# Patient Record
Sex: Female | Born: 1935 | Race: White | Hispanic: No | Marital: Married | Smoking: Former smoker
Health system: Southern US, Community
[De-identification: ages and names within clinical notes are randomized; demographics above are authoritative.]

## PROBLEM LIST (undated history)

## (undated) DIAGNOSIS — R1310 Dysphagia, unspecified: Secondary | ICD-10-CM

## (undated) DIAGNOSIS — Z9889 Other specified postprocedural states: Secondary | ICD-10-CM

## (undated) DIAGNOSIS — I10 Essential (primary) hypertension: Secondary | ICD-10-CM

## (undated) DIAGNOSIS — F32A Depression, unspecified: Secondary | ICD-10-CM

## (undated) DIAGNOSIS — J45909 Unspecified asthma, uncomplicated: Secondary | ICD-10-CM

## (undated) DIAGNOSIS — C50919 Malignant neoplasm of unspecified site of unspecified female breast: Secondary | ICD-10-CM

## (undated) DIAGNOSIS — W19XXXA Unspecified fall, initial encounter: Secondary | ICD-10-CM

## (undated) DIAGNOSIS — E119 Type 2 diabetes mellitus without complications: Secondary | ICD-10-CM

## (undated) DIAGNOSIS — N189 Chronic kidney disease, unspecified: Secondary | ICD-10-CM

## (undated) DIAGNOSIS — Z923 Personal history of irradiation: Secondary | ICD-10-CM

## (undated) DIAGNOSIS — R296 Repeated falls: Secondary | ICD-10-CM

## (undated) DIAGNOSIS — C801 Malignant (primary) neoplasm, unspecified: Secondary | ICD-10-CM

## (undated) DIAGNOSIS — R112 Nausea with vomiting, unspecified: Secondary | ICD-10-CM

## (undated) DIAGNOSIS — F329 Major depressive disorder, single episode, unspecified: Secondary | ICD-10-CM

## (undated) HISTORY — PX: SPINE SURGERY: SHX786

## (undated) HISTORY — DX: Essential (primary) hypertension: I10

## (undated) HISTORY — DX: Type 2 diabetes mellitus without complications: E11.9

## (undated) HISTORY — PX: OTHER SURGICAL HISTORY: SHX169

## (undated) HISTORY — PX: SHOULDER SURGERY: SHX246

## (undated) HISTORY — DX: Depression, unspecified: F32.A

## (undated) HISTORY — DX: Unspecified fall, initial encounter: W19.XXXA

## (undated) HISTORY — DX: Major depressive disorder, single episode, unspecified: F32.9

## (undated) HISTORY — PX: ABDOMINAL HYSTERECTOMY: SHX81

## (undated) HISTORY — PX: HERNIA REPAIR: SHX51

## (undated) HISTORY — PX: BREAST CYST ASPIRATION: SHX578

## (undated) HISTORY — DX: Malignant (primary) neoplasm, unspecified: C80.1

---

## 1981-12-28 HISTORY — PX: HALLUX VALGUS CORRECTION: SUR315

## 2004-12-28 DIAGNOSIS — C801 Malignant (primary) neoplasm, unspecified: Secondary | ICD-10-CM

## 2004-12-28 HISTORY — PX: BREAST BIOPSY: SHX20

## 2004-12-28 HISTORY — DX: Malignant (primary) neoplasm, unspecified: C80.1

## 2017-01-06 ENCOUNTER — Other Ambulatory Visit: Payer: Self-pay | Admitting: Internal Medicine

## 2017-01-06 DIAGNOSIS — Z1239 Encounter for other screening for malignant neoplasm of breast: Secondary | ICD-10-CM

## 2017-01-14 ENCOUNTER — Ambulatory Visit: Payer: Medicare Other | Admitting: Physical Therapy

## 2017-01-18 ENCOUNTER — Encounter: Payer: Medicare Other | Admitting: Physical Therapy

## 2017-01-20 ENCOUNTER — Ambulatory Visit: Payer: Medicare Other | Attending: Internal Medicine

## 2017-01-20 VITALS — BP 132/63 | HR 79

## 2017-01-20 DIAGNOSIS — Z9181 History of falling: Secondary | ICD-10-CM | POA: Diagnosis present

## 2017-01-20 DIAGNOSIS — G8929 Other chronic pain: Secondary | ICD-10-CM | POA: Insufficient documentation

## 2017-01-20 DIAGNOSIS — M6281 Muscle weakness (generalized): Secondary | ICD-10-CM | POA: Diagnosis present

## 2017-01-20 DIAGNOSIS — M545 Low back pain: Secondary | ICD-10-CM | POA: Diagnosis present

## 2017-01-20 DIAGNOSIS — R2681 Unsteadiness on feet: Secondary | ICD-10-CM | POA: Diagnosis present

## 2017-01-20 NOTE — Therapy (Signed)
[facility] [address] [place], [address] Phone: [phone]   Fax:  [phone]  Physical Therapy Evaluation  Patient Details  Name: Kelsey Haynes MRN: [id number] Date of Birth: Jul 04, 1932 Referring Provider: Glendon Axe, MD  Encounter Date: 01/20/2017      PT End of Session - 01/20/17 0934    Visit Number 1   Number of Visits 13   Date for PT Re-Evaluation 03/04/17   Authorization Type 1   Authorization Time Period of 10 g code   PT Start Time 0938   PT Stop Time 1039   PT Time Calculation (min) 61 min   Equipment Utilized During Treatment Gait belt   Activity Tolerance Patient tolerated treatment well   Behavior During Therapy [facility] for tasks assessed/performed      Past Medical History:  Diagnosis Date  . Cancer [facility])    breast cancer 2006  . Depression   . Diabetes mellitus without complication ([place])   . Hypertension     Past Surgical History:  Procedure Laterality Date  . ABDOMINAL HYSTERECTOMY    . endoscopic carpal tunnel release    . HALLUX VALGUS CORRECTION Left 1983  . HERNIA REPAIR     x2  . left foot surgery Left   . SHOULDER SURGERY Bilateral   . SPINE SURGERY     back    Vitals:   01/20/17 0945 01/20/17 1017  BP: (!) 148/66 132/63  Pulse: 75 79         Subjective Assessment - 01/20/17 0945    Subjective back pain: 3/10 currently (pt sitting); 6/10 at most for the past 2 months   Pertinent History DDD, imbalance. Pt states recently moving here from [place] to the [employer]. Back pain recently started again (had back surgery 2006 which helped before). Feels like her age is catching up on her. Bending forward, cooking bothers her. Pt sometimes feel like she has to use the bathroom more often but has not become enough of a problem for her. Has control of bladder or bowel function. Woke up with a headache and a slight fever this past Monday, went to [place]  (was given a fall risk bracelet). Feels fine today. Pt states having problems adjusting to the humidity, got a humidifier, which helped her headaches.  Pt states having balance problems for the past 10 years. Had a fall 1 month ago due to tripping on something. Feels like if she is not careful, she falls. Has never been hurt in a fall before. Her neurologist in [place] recommended for her to use a SPC.  Pt states that she does not have osteoporosis. Had PT for balance before which helped. Feels like her balance is somewhat better due to prior PT.  Has not noticed light headedness or dizziness when she falls. Just feels like she makes a wrong move and has a hard time stopping herself.  Pt states that her L leg bothers her (dermatology thing). 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  The leg is not healing as well as it should. Has diabetes.  Does not bother her when she walks.   Pt also adds that fatigue might play a factor with her falls.   Patient Stated Goals Be able to go back and ride her bike, roller skate, go shopping (for longer hours (more than half a day)   Currently in Pain? Yes   Pain Score  3    Pain Location Back   Pain Orientation Lower   Pain Descriptors / Indicators Aching;Numbness;Pins and needles   Pain Type Chronic pain   Pain Onset More than a month ago   Pain Frequency Constant   Aggravating Factors  bending forward, standing and cooking, standing 30 minutes   Pain Relieving Factors resting on her R or L side (mostly on her L side), sitting, possibly walking, sitting up straight and not resting her back against the chair            [facility] PT Assessment - 01/20/17 0941      Assessment   Medical Diagnosis lumbosacral DDD, imbalance   Referring Provider Glendon Axe, MD   Onset Date/Surgical Date --  chronic condition   Hand Dominance Left   Next MD Visit 3 months from now   Prior Therapy had prior PT for balance which helped     Precautions    Precaution Comments fall risk     Restrictions   Other Position/Activity Restrictions no known restrictions     Balance Screen   Has the patient fallen in the past 6 months Yes   How many times? 1   Has the patient had a decrease in activity level because of a fear of falling?  No   Is the patient reluctant to leave their home because of a fear of falling?  No     Home Environment   Additional Comments pt lives in an apartment at the [facility] at [place], no steps to enter.      Prior Function   Vocation Retired  retired Ship broker PLOF: better able to cook, tolerate standing, bend over with less back pain.  Less fall risk.      Observation/Other Assessments   Observations light headed after sitting down from standing. Eases with rest.  TUG without SPC 13 seconds, 11 seconds, 10 seconds (11.3 seconds average);  five times sit to stand 12 seconds , use of bilateral UE    Modified Oswertry 34%   Lower Extremity Functional Scale  54/80     Posture/Postural Control   Posture Comments decreased weight bearing L LE, slight L lateral shift, bilaterally protracted shoulders, thoracic kyphosis , L iliac crest higher     AROM   Lumbar Flexion WFL with bilateral LE pulling  Pt head not clear when returning to standing. Eases at rest   Lumbar Extension limited   Lumbar - Right Side Bend limited with quick discomfort   Lumbar - Left Side Bend limited   Lumbar - Right Rotation full   Lumbar - Left Rotation limited. Pt does not feel solid support from both legs     Strength   Right Hip Flexion 4+/5   Right Hip ABduction 4-/5   Left Hip Flexion 4/5   Left Hip ABduction 4-/5   Right Knee Flexion 4/5   Right Knee Extension 4+/5   Left Knee Flexion 4+/5   Left Knee Extension 4+/5     Palpation   Palpation comment TTP L lateral hip at greater trochanter and lateral thigh     Ambulation/Gait   Gait Comments antalgic, decreased stance R LE with R lateral lean, slight  unsteadiness                         PT Education - 01/20/17 1320    Education provided Yes   Education Details plan of care  Person(s) Educated Patient   Methods Explanation   Comprehension Verbalized understanding             PT Long Term Goals - 01/20/17 1149      PT LONG TERM GOAL #1   Title Patient will have a decrease in back pain to 3/10 or less at most to promote ability to perform standing tasks such as cooking.    Baseline 6/10 at most for the past 2 months (01/20/2017)   Time 6   Period Weeks   Status New     PT LONG TERM GOAL #2   Title Patient will improve her Modified Oswestry Low Back Pain Disability Questionnaire by at least 12% as a demonstration of improved function.    Baseline 34% (01/20/2017)   Time 6   Period Weeks   Status New     PT LONG TERM GOAL #3   Title Patient will improve her LEFS score by at least 9 points as a demonnstration of improved function.    Baseline 54/80 (01/20/2017)   Time 6   Period Weeks   Status New     PT LONG TERM GOAL #4   Title Pt will improve bilateral LE strength by at least 1/2 MMT grade to promote more steadiness with gait, decrease fall risk, and improve ability to perform standing tasks.    Time 6   Period Weeks   Status New     PT LONG TERM GOAL #5   Title Pt will improve her 5 times sit to stand time without using hands to 11 seconds or less to promote balance.   Baseline 12 seconds with use of hands (01/20/2017)   Time 6   Period Weeks   Status New               Plan - 01/20/17 1130    Clinical Impression Statement Patient is an 83 year old female who came to physical therapy secondary to low back pain and decreased balance. She also presents with altered gait pattern (with unsteadiness) and posture, bilateral LE weakness, reproduction of symptoms with lumbar flexion R side bending and L rotation with L lumbar rotation causing decreased LE support in standing per pt reports, and  difficulty performing functional tasks such as cooking, as well as movements such as bending forward and tolerating positions such as standing. Patient will benefit from skilled physical therapy services to address the aforementioned deficits.    Rehab Potential Fair   Clinical Impairments Affecting Rehab Potential chronicity of condition   PT Frequency 2x / week   PT Duration 6 weeks   PT Treatment/Interventions Therapeutic exercise;Therapeutic activities;Manual techniques;Patient/family education;Neuromuscular re-education;Ultrasound;Gait training;Iontophoresis 4mg /ml Dexamethasone;Electrical Stimulation;Aquatic Therapy   PT Next Visit Plan LE strengthenig, balance, thoracic extension, scapular strengthening, core strengthening   Consulted and Agree with Plan of Care Patient      Patient will benefit from skilled therapeutic intervention in order to improve the following deficits and impairments:  Pain, Postural dysfunction, Improper body mechanics, Difficulty walking, Decreased strength, Decreased balance  Visit Diagnosis: Chronic bilateral low back pain, with sciatica presence unspecified - Plan: PT plan of care cert/re-cert  History of falling - Plan: PT plan of care cert/re-cert  Muscle weakness (generalized) - Plan: PT plan of care cert/re-cert  Unsteadiness on feet - Plan: PT plan of care cert/re-cert      G-Codes - [id number] 04-21-05    Functional Assessment Tool Used Modified Oswestry Low Back Pain Disability Questionnaire, LEFS, clinical presentation, patient  interview   Functional Limitation Mobility: Walking and moving around   Mobility: Walking and Moving Around Current Status [phone]) At least 20 percent but less than 40 percent impaired, limited or restricted   Mobility: Walking and Moving Around Goal Status [phone]) At least 1 percent but less than 20 percent impaired, limited or restricted       Problem List There are no active problems to display for this  patient.   Joneen Boers PT, DPT  01/20/2017, 1:33 PM  [facility] [address] [place], [address] Phone: [phone]   Fax:  [phone]  Name: Kelsey Haynes MRN: [id number] Date of Birth: 1932-04-03

## 2017-01-25 ENCOUNTER — Ambulatory Visit: Payer: Medicare Other

## 2017-01-25 DIAGNOSIS — G8929 Other chronic pain: Secondary | ICD-10-CM

## 2017-01-25 DIAGNOSIS — M6281 Muscle weakness (generalized): Secondary | ICD-10-CM

## 2017-01-25 DIAGNOSIS — Z9181 History of falling: Secondary | ICD-10-CM

## 2017-01-25 DIAGNOSIS — M545 Low back pain: Secondary | ICD-10-CM | POA: Diagnosis not present

## 2017-01-25 DIAGNOSIS — R2681 Unsteadiness on feet: Secondary | ICD-10-CM

## 2017-01-25 NOTE — Therapy (Signed)
[facility] [address], [place], [address] Phone: [phone]   Fax:  [phone]  Physical Therapy Treatment  Patient Details  Name: Kelsey Haynes MRN: [id number] Date of Birth: May 12, 1932 Referring Provider: Glendon Axe, MD  Encounter Date: 01/25/2017      PT End of Session - 01/25/17 1449    Visit Number 2   Number of Visits 13   Date for PT Re-Evaluation 03/04/17   Authorization Type 2   Authorization Time Period of 10 g code   PT Start Time 1450   PT Stop Time 1532   PT Time Calculation (min) 42 min   Equipment Utilized During Treatment Gait belt   Activity Tolerance Patient tolerated treatment well   Behavior During Therapy [facility] for tasks assessed/performed      Past Medical History:  Diagnosis Date  . Cancer [facility])    breast cancer 2006  . Depression   . Diabetes mellitus without complication ([place])   . Hypertension     Past Surgical History:  Procedure Laterality Date  . ABDOMINAL HYSTERECTOMY    . endoscopic carpal tunnel release    . HALLUX VALGUS CORRECTION Left 1983  . HERNIA REPAIR     x2  . left foot surgery Left   . SHOULDER SURGERY Bilateral   . SPINE SURGERY     back    There were no vitals filed for this visit.      Subjective Assessment - 01/25/17 1451    Subjective Had surgery in her L leg and has an open wound about the size of a quarter. The doctor wants it to heal naturally. Went to [facility] for that last Friday.  Sat prior to back bothering her. 3/10 back pain currently.  Walking 2-3 hours in the apartement earlier today bothers her back.  The irritation is in the mid back area and not down low.   One of the back surgeries was a fusion around L4/L5 area.    Pertinent History DDD, imbalance. Pt states recently moving here from [place] to the [employer]. Back pain recently started again (had back surgery 2006 which helped before). Feels like her age is catching  up on her. Bending forward, cooking bothers her. Pt sometimes feel like she has to use the bathroom more often but has not become enough of a problem for her. Has control of bladder or bowel function. Woke up with a headache and a slight fever this past Monday, went to [place] (was given a fall risk bracelet). Feels fine today. Pt states having problems adjusting to the humidity, got a humidifier, which helped her headaches.  Pt states having balance problems for the past 10 years. Had a fall 1 month ago due to tripping on something. Feels like if she is not careful, she falls. Has never been hurt in a fall before. Her neurologist in [place] recommended for her to use a SPC.  Pt states that she does not have osteoporosis. Had PT for balance before which helped. Feels like her balance is somewhat better due to prior PT.  Has not noticed light headedness or dizziness when she falls. Just feels like she makes a wrong move and has a hard time stopping herself.  Pt states that her L leg bothers her (dermatology thing). 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  The leg is not healing as well as it should. Has diabetes.  Does  not bother her when she walks.   Pt also adds that fatigue might play a factor with her falls.   Patient Stated Goals Be able to go back and ride her bike, roller skate, go shopping (for longer hours (more than half a day)   Currently in Pain? Yes   Pain Score 3    Pain Onset More than a month ago                                 PT Education - 01/25/17 1910    Education provided Yes   Education Details ther-ex, HEP   Person(s) Educated Patient   Methods Explanation;Demonstration;Tactile cues;Verbal cues;Handout   Comprehension Verbalized understanding;Returned demonstration       Objectives  There-ex  Directed patient with supine posterior pelvic tilts 10x3 Supine bilateral scapular retraction 10x3  Supine naval -ins 10x3 with  5 seconnd holds   Then with pelvic floor contraction 10x2 with 5 seocnd holds   S/L clam shells 10x, then 7x for R side, 10x2 for L side (L lateral LE paresthesia after 2nd set of 10 which eases in supine hooklying position and rest)   Limited bilateral hip 90/90 IR    L hip IR stretch by PT in hooklying with L LE crossed over R  Improved L hip IR ROM in 90/90 position.   Improved exercise technique, movement at target joints, use of target muscles after mod verbal, visual, tactile cues.    Worked on lumbopelvic stability and hip strengthening to promote trunk control with gait. Pt tolerated session well without aggravation of symptoms.        PT Long Term Goals - 01/20/17 1149      PT LONG TERM GOAL #1   Title Patient will have a decrease in back pain to 3/10 or less at most to promote ability to perform standing tasks such as cooking.    Baseline 6/10 at most for the past 2 months (01/20/2017)   Time 6   Period Weeks   Status New     PT LONG TERM GOAL #2   Title Patient will improve her Modified Oswestry Low Back Pain Disability Questionnaire by at least 12% as a demonstration of improved function.    Baseline 34% (01/20/2017)   Time 6   Period Weeks   Status New     PT LONG TERM GOAL #3   Title Patient will improve her LEFS score by at least 9 points as a demonnstration of improved function.    Baseline 54/80 (01/20/2017)   Time 6   Period Weeks   Status New     PT LONG TERM GOAL #4   Title Pt will improve bilateral LE strength by at least 1/2 MMT grade to promote more steadiness with gait, decrease fall risk, and improve ability to perform standing tasks.    Time 6   Period Weeks   Status New     PT LONG TERM GOAL #5   Title Pt will improve her 5 times sit to stand time without using hands to 11 seconds or less to promote balance.   Baseline 12 seconds with use of hands (01/20/2017)   Time 6   Period Weeks   Status New               Plan - 01/25/17 1910     Clinical Impression Statement Worked on lumbopelvic stability and hip strengthening  to promote trunk control with gait. Pt tolerated session well without aggravation of symptoms.    Rehab Potential Fair   Clinical Impairments Affecting Rehab Potential chronicity of condition   PT Frequency 2x / week   PT Duration 6 weeks   PT Treatment/Interventions Therapeutic exercise;Therapeutic activities;Manual techniques;Patient/family education;Neuromuscular re-education;Ultrasound;Gait training;Iontophoresis 4mg /ml Dexamethasone;Electrical Stimulation;Aquatic Therapy   PT Next Visit Plan LE strengthenig, balance, thoracic extension, scapular strengthening, core strengthening   Consulted and Agree with Plan of Care Patient      Patient will benefit from skilled therapeutic intervention in order to improve the following deficits and impairments:  Pain, Postural dysfunction, Improper body mechanics, Difficulty walking, Decreased strength, Decreased balance  Visit Diagnosis: Chronic bilateral low back pain, with sciatica presence unspecified  History of falling  Muscle weakness (generalized)  Unsteadiness on feet     Problem List There are no active problems to display for this patient.   Joneen Boers PT, DPT   01/25/2017, 7:14 PM  [facility] [address] [place], [address] Phone: [phone]   Fax:  [phone]  Name: Kelsey Haynes MRN: [id number] Date of Birth: 1932-04-02

## 2017-01-25 NOTE — Patient Instructions (Signed)
   On your back   Gently pull your belly button in. Hold for 5 seconds.   Repeat 10 times   Do 3 sets (at least) daily.

## 2017-01-27 ENCOUNTER — Ambulatory Visit: Payer: Medicare Other

## 2017-01-27 DIAGNOSIS — M6281 Muscle weakness (generalized): Secondary | ICD-10-CM

## 2017-01-27 DIAGNOSIS — R2681 Unsteadiness on feet: Secondary | ICD-10-CM

## 2017-01-27 DIAGNOSIS — M545 Low back pain: Secondary | ICD-10-CM | POA: Diagnosis not present

## 2017-01-27 DIAGNOSIS — Z9181 History of falling: Secondary | ICD-10-CM

## 2017-01-27 DIAGNOSIS — G8929 Other chronic pain: Secondary | ICD-10-CM

## 2017-01-27 NOTE — Therapy (Signed)
[facility] [address], [place], [address] Phone: [phone]   Fax:  [phone]  Physical Therapy Treatment  Patient Details  Name: Kelsey Haynes MRN: [id number] Date of Birth: Jul 10, 1932 Referring Provider: Glendon Axe, MD  Encounter Date: 01/27/2017      PT End of Session - 01/27/17 [id number]    Visit Number 3   Number of Visits 13   Date for PT Re-Evaluation 03/04/17   Authorization Type 3   Authorization Time Period of 10 g code   PT Start Time [id number]   PT Stop Time 1529   PT Time Calculation (min) 50 min   Equipment Utilized During Treatment Gait belt   Activity Tolerance Patient tolerated treatment well   Behavior During Therapy [facility] for tasks assessed/performed      Past Medical History:  Diagnosis Date  . Cancer [facility])    breast cancer 2006  . Depression   . Diabetes mellitus without complication ([place])   . Hypertension     Past Surgical History:  Procedure Laterality Date  . ABDOMINAL HYSTERECTOMY    . endoscopic carpal tunnel release    . HALLUX VALGUS CORRECTION Left 1983  . HERNIA REPAIR     x2  . left foot surgery Left   . SHOULDER SURGERY Bilateral   . SPINE SURGERY     back    There were no vitals filed for this visit.      Subjective Assessment - 01/27/17 1440    Subjective Back is ok. Not particularly having pain. Has not done much up and down sitting etc. R knee bothers her but her doctor does not want to do surgery because her diabetes affects her healing process.    Pertinent History DDD, imbalance. Pt states recently moving here from [place] to the [employer]. Back pain recently started again (had back surgery 2006 which helped before). Feels like her age is catching up on her. Bending forward, cooking bothers her. Pt sometimes feel like she has to use the bathroom more often but has not become enough of a problem for her. Has control of bladder or bowel  function. Woke up with a headache and a slight fever this past Monday, went to [place] (was given a fall risk bracelet). Feels fine today. Pt states having problems adjusting to the humidity, got a humidifier, which helped her headaches.  Pt states having balance problems for the past 10 years. Had a fall 1 month ago due to tripping on something. Feels like if she is not careful, she falls. Has never been hurt in a fall before. Her neurologist in [place] recommended for her to use a SPC.  Pt states that she does not have osteoporosis. Had PT for balance before which helped. Feels like her balance is somewhat better due to prior PT.  Has not noticed light headedness or dizziness when she falls. Just feels like she makes a wrong move and has a hard time stopping herself.  Pt states that her L leg bothers her (dermatology thing). 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  The leg is not healing as well as it should. Has diabetes.  Does not bother her when she walks.   Pt also adds that fatigue might play a factor with her falls.   Patient Stated Goals Be able to go back and ride her bike, roller skate, go shopping (for longer hours (more than half  a day)   Currently in Pain? No/denies  no back pain   Pain Onset More than a month ago            [facility] PT Assessment - 01/27/17 1443      Berg Balance Test   Sit to Stand Able to stand without using hands and stabilize independently   Standing Unsupported Able to stand safely 2 minutes   Sitting with Back Unsupported but Feet Supported on Floor or Stool Able to sit safely and securely 2 minutes   Stand to Sit Sits safely with minimal use of hands   Transfers Able to transfer safely, minor use of hands   Standing Unsupported with Eyes Closed Able to stand 10 seconds safely   Standing Ubsupported with Feet Together Able to place feet together independently and stand for 1 minute with supervision   From Standing, Reach Forward with  Outstretched Arm Can reach forward >5 cm safely (2")   From Standing Position, Pick up Object from [place] to pick up shoe safely and easily   From Standing Position, Turn to Look Behind Over each Shoulder Looks behind from both sides and weight shifts well   Turn 360 Degrees Needs close supervision or verbal cueing  secondary to backwards lean   Standing Unsupported, Alternately Place Feet on Step/Stool Able to complete 4 steps without aid or supervision   Standing Unsupported, One Foot in [place] to take small step independently and hold 30 seconds   Standing on One Leg Unable to try or needs assist to prevent fall  for L LE secondary to wound; 10 sec for R LE   Total Score 42                             PT Education - 01/27/17 1441    Education provided Yes   Education Details ther-ex   [employer]) Educated Patient   Methods Explanation;Demonstration;Tactile cues;Verbal cues   Comprehension Verbalized understanding;Returned demonstration        Objectives  There-ex  Directed patient with sit <> stand throughout session Stand pivot transfer chair <> low mat table Static standing shoulder width apart, then with eyes closed, then with eyes open feet together, tandem stance with feet shoulder width apart Picking up an object (computer mouse) from the floor,  Turning 360 degrees to the R and to the L. Able to turn to the R in 4 seconds, L in greater than 4 seconds. Unsteady at the stopping point due to backwards lean.  Looking behind to the R and to the L,  Standing forward reach,   Standing alternate toe taps 4 x each LE onto stool without UE assist, but with CGA  Side stepping 32 ft to the L and R CGA for glute med strengthening.  standing hip abduction with bilateral UE assist 10x2 each LE for glute med strengthening to promote pelvic control and decrease lateral lean during gait.   Standing bilateral shoulder extension resisting yellow band 10x2  with abdominal muscle use to promote trunk strengthening.   Standing forward weight shifting without UE assist but CGA 10x5 seconds secondary to tendency for backwards lean    Improved exercise technique, movement at target joints, use of target muscles after mod verbal, visual, tactile cues.    Able to perform standing L trunk rotation without feeling of lack of support from LE today. Able to perform SLS on R LE for 10 seconds  but unable to perform for L LE due to L leg discomfort from skin biopsy/surgery. Pt also demonstrates R knee joint line pain with R side stepping, eases with rest and with L side stepping. R posterior knee pain when standing with equal weight distribution. Decreased with L weight shifting. Worked on standing forward weight shifting as well secondary to tendency for backwards lean. Pt observed to have unsteadiness at times due to backwards lean.          PT Long Term Goals - 01/20/17 1149      PT LONG TERM GOAL #1   Title Patient will have a decrease in back pain to 3/10 or less at most to promote ability to perform standing tasks such as cooking.    Baseline 6/10 at most for the past 2 months (01/20/2017)   Time 6   Period Weeks   Status New     PT LONG TERM GOAL #2   Title Patient will improve her Modified Oswestry Low Back Pain Disability Questionnaire by at least 12% as a demonstration of improved function.    Baseline 34% (01/20/2017)   Time 6   Period Weeks   Status New     PT LONG TERM GOAL #3   Title Patient will improve her LEFS score by at least 9 points as a demonnstration of improved function.    Baseline 54/80 (01/20/2017)   Time 6   Period Weeks   Status New     PT LONG TERM GOAL #4   Title Pt will improve bilateral LE strength by at least 1/2 MMT grade to promote more steadiness with gait, decrease fall risk, and improve ability to perform standing tasks.    Time 6   Period Weeks   Status New     PT LONG TERM GOAL #5   Title Pt will  improve her 5 times sit to stand time without using hands to 11 seconds or less to promote balance.   Baseline 12 seconds with use of hands (01/20/2017)   Time 6   Period Weeks   Status New               Plan - 01/27/17 1437    Clinical Impression Statement Able to perform standing L trunk rotation without feeling of lack of support from LE today. Able to perform SLS on R LE for 10 seconds but unable to perform for L LE due to L leg discomfort from skin biopsy/surgery. Pt also demonstrates R knee joint line pain with R side stepping, eases with rest and with L side stepping. R posterior knee pain when standing with equal weight distribution. Decreased with L weight shifting. Worked on standing forward weight shifting as well secondary to tendency for backwards lean. Pt observed to have unsteadiness at times due to backwards lean.    Rehab Potential Fair   Clinical Impairments Affecting Rehab Potential chronicity of condition   PT Frequency 2x / week   PT Duration 6 weeks   PT Treatment/Interventions Therapeutic exercise;Therapeutic activities;Manual techniques;Patient/family education;Neuromuscular re-education;Ultrasound;Gait training;Iontophoresis 4mg /ml Dexamethasone;Electrical Stimulation;Aquatic Therapy   PT Next Visit Plan LE strengthenig, balance, thoracic extension, scapular strengthening, core strengthening   Consulted and Agree with Plan of Care Patient      Patient will benefit from skilled therapeutic intervention in order to improve the following deficits and impairments:  Pain, Postural dysfunction, Improper body mechanics, Difficulty walking, Decreased strength, Decreased balance  Visit Diagnosis: Chronic bilateral low back pain, with sciatica presence unspecified  History of falling  Muscle weakness (generalized)  Unsteadiness on feet     Problem List There are no active problems to display for this patient.   Joneen Boers PT, DPT   01/27/2017, 3:42  PM  [place] [facility] [address] [place], [address] Phone: [phone]   Fax:  [phone]  Name: Allyssia Sisto MRN: [id number] Date of Birth: 1932/02/16

## 2017-02-01 ENCOUNTER — Encounter: Payer: Medicare Other | Admitting: Physical Therapy

## 2017-02-02 ENCOUNTER — Ambulatory Visit: Payer: Medicare Other | Attending: Internal Medicine

## 2017-02-02 DIAGNOSIS — Z9181 History of falling: Secondary | ICD-10-CM | POA: Diagnosis present

## 2017-02-02 DIAGNOSIS — G8929 Other chronic pain: Secondary | ICD-10-CM

## 2017-02-02 DIAGNOSIS — M545 Low back pain: Secondary | ICD-10-CM | POA: Insufficient documentation

## 2017-02-02 DIAGNOSIS — R2681 Unsteadiness on feet: Secondary | ICD-10-CM | POA: Diagnosis present

## 2017-02-02 DIAGNOSIS — M6281 Muscle weakness (generalized): Secondary | ICD-10-CM | POA: Diagnosis present

## 2017-02-02 NOTE — Therapy (Signed)
[place] [facility] [address], [place], [address] Phone: [phone]   Fax:  [phone]  Physical Therapy Treatment  Patient Details  Name: Kelsey Haynes MRN: [id number] Date of Birth: 08/17/1932 Referring Provider: Glendon Axe, MD  Encounter Date: 02/02/2017      PT End of Session - 02/02/17 1430    Visit Number 4   Number of Visits 13   Date for PT Re-Evaluation 03/04/17   Authorization Type 4   Authorization Time Period of 10 g code   PT Start Time 1430   PT Stop Time 1516   PT Time Calculation (min) 46 min   Equipment Utilized During Treatment Gait belt   Activity Tolerance Patient tolerated treatment well   Behavior During Therapy [facility] for tasks assessed/performed      Past Medical History:  Diagnosis Date  . Cancer [facility])    breast cancer 2006  . Depression   . Diabetes mellitus without complication ([place])   . Hypertension     Past Surgical History:  Procedure Laterality Date  . ABDOMINAL HYSTERECTOMY    . endoscopic carpal tunnel release    . HALLUX VALGUS CORRECTION Left 1983  . HERNIA REPAIR     x2  . left foot surgery Left   . SHOULDER SURGERY Bilateral   . SPINE SURGERY     back    There were no vitals filed for this visit.      Subjective Assessment - 02/02/17 1431    Subjective Pt states not feeling well. Feels like she is tired of fighting her L leg wound which is about as big as a quarter, and a quarter of an inch deep. Supposed to do a little walking but nothing strenuous and supposed to put her feet up. Feels like making some progress this morning. Last Friday, her wound looked like a mess.  Has not been doing much so back has not really been bothering her. Has been doing exercises.  No fever.  Pt states that she gets dizzy getting in and out of bed. Has had dizziness before.    Pertinent History DDD, imbalance. Pt states recently moving here from [place] to the [employer]. Back pain recently started again (had back surgery 2006 which helped before). Feels like her age is catching up on her. Bending forward, cooking bothers her. Pt sometimes feel like she has to use the bathroom more often but has not become enough of a problem for her. Has control of bladder or bowel function. Woke up with a headache and a slight fever this past Monday, went to [place] (was given a fall risk bracelet). Feels fine today. Pt states having problems adjusting to the humidity, got a humidifier, which helped her headaches.  Pt states having balance problems for the past 10 years. Had a fall 1 month ago due to tripping on something. Feels like if she is not careful, she falls. Has never been hurt in a fall before. Her neurologist in [place] recommended for her to use a SPC.  Pt states that she does not have osteoporosis. Had PT for balance before which helped. Feels like her balance is somewhat better due to prior PT.  Has not noticed light headedness or dizziness when she falls. Just feels like she makes a wrong move and has a hard time stopping herself.  Pt states that her L leg bothers her (dermatology thing). Had a biopsy for her L leg which  revealed a squamous cell carcinoma.  The leg is not healing as well as it should. Has diabetes.  Does not bother her when she walks.   Pt also adds that fatigue might play a factor with her falls.   Patient Stated Goals Be able to go back and ride her bike, roller skate, go shopping (for longer hours (more than half a day)   Currently in Pain? Other (Comment)  pt states back not really bothering her   Pain Onset More than a month ago                                 PT Education - 02/02/17 1448    Education provided Yes   Education Details ther-ex   [employer]) Educated Patient   Methods Explanation;Demonstration;Tactile cues;Verbal cues;Handout   Comprehension Returned demonstration;Verbalized understanding         Objectives  There-ex  Seated bilateral scapular retraction 10x3 with 5 second holds   Standing forward weight shifting without UE assist but CGA 10x5 seconds for 2 sets  secondary to tendency for backwards lean  Sitting with proper posture: gentle manual perturbation from PT through PVC rod 5x10 seconds for 2 sets to promote trunk strengthening   Standing hip abduction with bilateral UE assist 10x2 R LE; 2x5, then 10x L LE with R LE on Air Ex pad each LE (decreased R knee and ankle pressure when R LE on Air Ex pad)  Standing alternating toe taps onto treadmill platform without UE assist 2x5 Slight light headedness which decreased with rest  Blood pressure obtained, L hand sitting, mechanically taken: 129/58, HR 91   Blood pressure L arm standing after 2 min, mechanically taken 103/55, HR 89   Pt looking at PT finger R and L directions with static head  Nystagmus observed bilateral eyes R > L for L and R directions.    Improved exercise technique, movement at target joints, use of target muscles after mod verbal, visual, tactile cues.     Worked on thoracic extension, trunk strengthening and glute med strengthening for back pain, worked on standing forward weight shifting and glute med strengthening for balance. Pt also demonstrates slight lightheadedness with performing a standing balance activity (standing alternating toe taps). Blood pressure lower in standing compared to sitting. Pt also demonstrates slight nystagmus R > L eye when pt trying to follow PT finger L and R horizontal directions. LOB x 1 when walking today when on R LE stance phase in which R knee pain might play a factor.          PT Long Term Goals - 01/20/17 1149      PT LONG TERM GOAL #1   Title Patient will have a decrease in back pain to 3/10 or less at most to promote ability to perform standing tasks such as cooking.    Baseline 6/10 at most for the past 2 months (01/20/2017)   Time 6   Period  Weeks   Status New     PT LONG TERM GOAL #2   Title Patient will improve her Modified Oswestry Low Back Pain Disability Questionnaire by at least 12% as a demonstration of improved function.    Baseline 34% (01/20/2017)   Time 6   Period Weeks   Status New     PT LONG TERM GOAL #3   Title Patient will improve her LEFS score by at least 9 points  as a demonnstration of improved function.    Baseline 54/80 (01/20/2017)   Time 6   Period Weeks   Status New     PT LONG TERM GOAL #4   Title Pt will improve bilateral LE strength by at least 1/2 MMT grade to promote more steadiness with gait, decrease fall risk, and improve ability to perform standing tasks.    Time 6   Period Weeks   Status New     PT LONG TERM GOAL #5   Title Pt will improve her 5 times sit to stand time without using hands to 11 seconds or less to promote balance.   Baseline 12 seconds with use of hands (01/20/2017)   Time 6   Period Weeks   Status New               Plan - 02/02/17 1429    Clinical Impression Statement Worked on thoracic extension, trunk strengthening and glute med strengthening for back pain, worked on standing forward weight shifting and glute med strengthening for balance. Pt also demonstrates slight lightheadedness with performing a standing balance activity (standing alternating toe taps). Blood pressure lower in standing compared to sitting. Pt also demonstrates slight nystagmus R > L eye when pt trying to follow PT finger L and R horizontal directions. LOB x 1 when walking today when on R LE stance phase in which R knee pain might play a factor.    Rehab Potential Fair   Clinical Impairments Affecting Rehab Potential chronicity of condition   PT Frequency 2x / week   PT Duration 6 weeks   PT Treatment/Interventions Therapeutic exercise;Therapeutic activities;Manual techniques;Patient/family education;Neuromuscular re-education;Ultrasound;Gait training;Iontophoresis 4mg /ml  Dexamethasone;Electrical Stimulation;Aquatic Therapy   PT Next Visit Plan LE strengthenig, balance, thoracic extension, scapular strengthening, core strengthening   Consulted and Agree with Plan of Care Patient      Patient will benefit from skilled therapeutic intervention in order to improve the following deficits and impairments:  Pain, Postural dysfunction, Improper body mechanics, Difficulty walking, Decreased strength, Decreased balance  Visit Diagnosis: Chronic bilateral low back pain, with sciatica presence unspecified  History of falling  Muscle weakness (generalized)  Unsteadiness on feet     Problem List There are no active problems to display for this patient.   Joneen Boers PT, DPT   02/02/2017, 7:18 PM  [place] [facility] [address] [place], [address] Phone: [phone]   Fax:  [phone]  Name: Brynley Badgley MRN: [id number] Date of Birth: October 19, 1932

## 2017-02-02 NOTE — Patient Instructions (Signed)
    Scapular Retraction (Sitting)   With arms at sides, pinch shoulder blades together. Hold for 5 seconds. Repeat __10__ times per set. Do __3__ sets per session daily.     Copyright  VHI. All rights reserved.

## 2017-02-03 ENCOUNTER — Encounter: Payer: Medicare Other | Admitting: Physical Therapy

## 2017-02-04 ENCOUNTER — Ambulatory Visit: Payer: Medicare Other

## 2017-02-04 DIAGNOSIS — G8929 Other chronic pain: Secondary | ICD-10-CM

## 2017-02-04 DIAGNOSIS — M6281 Muscle weakness (generalized): Secondary | ICD-10-CM

## 2017-02-04 DIAGNOSIS — M545 Low back pain: Principal | ICD-10-CM

## 2017-02-04 DIAGNOSIS — Z9181 History of falling: Secondary | ICD-10-CM

## 2017-02-04 DIAGNOSIS — R2681 Unsteadiness on feet: Secondary | ICD-10-CM

## 2017-02-04 NOTE — Therapy (Signed)
[facility] [address] [place], [address] Phone: [phone]   Fax:  [phone]  Physical Therapy Treatment  Patient Details  Name: Kelsey Haynes MRN: [id number] Date of Birth: 1932-10-12 Referring Provider: Glendon Axe, MD  Encounter Date: 02/04/2017      PT End of Session - 02/04/17 0950    Visit Number 5   Number of Visits 13   Date for PT Re-Evaluation 03/04/17   Authorization Type 5   Authorization Time Period of 10 g code   PT Start Time 0950   PT Stop Time 1030   PT Time Calculation (min) 40 min   Equipment Utilized During Treatment Gait belt   Activity Tolerance Patient tolerated treatment well   Behavior During Therapy [facility] for tasks assessed/performed      Past Medical History:  Diagnosis Date  . Cancer [facility])    breast cancer 2006  . Depression   . Diabetes mellitus without complication ([place])   . Hypertension     Past Surgical History:  Procedure Laterality Date  . ABDOMINAL HYSTERECTOMY    . endoscopic carpal tunnel release    . HALLUX VALGUS CORRECTION Left 1983  . HERNIA REPAIR     x2  . left foot surgery Left   . SHOULDER SURGERY Bilateral   . SPINE SURGERY     back    There were no vitals filed for this visit.      Subjective Assessment - 02/04/17 0952    Subjective The wound in her L leg has been pretty painful for the last couple of days. Yesterday was fine because she propped her feet up. Got the swelling off.  L leg is ok when doing her standing balance exercises.  Back has been doing ok.  Takes breaks when she feels like her back has had enough activity. Pt also adds that her doctor told her that the healing in her L leg might take a year.  Pt also states being "Foggy" when on her feet. Not "foggy" when sitting. It's nothing new.  Pt also states being stressed due to having a mamogram tomorrow (has hx of breast CA), husband is sick, and has a meeting to go to after  today's session.    Pertinent History DDD, imbalance. Pt states recently moving here from [place] to the [employer]. Back pain recently started again (had back surgery 2006 which helped before). Feels like her age is catching up on her. Bending forward, cooking bothers her. Pt sometimes feel like she has to use the bathroom more often but has not become enough of a problem for her. Has control of bladder or bowel function. Woke up with a headache and a slight fever this past Monday, went to [place] (was given a fall risk bracelet). Feels fine today. Pt states having problems adjusting to the humidity, got a humidifier, which helped her headaches.  Pt states having balance problems for the past 10 years. Had a fall 1 month ago due to tripping on something. Feels like if she is not careful, she falls. Has never been hurt in a fall before. Her neurologist in [place] recommended for her to use a SPC.  Pt states that she does not have osteoporosis. Had PT for balance before which helped. Feels like her balance is somewhat better due to prior PT.  Has not noticed light headedness or dizziness when she falls. Just feels like she makes a wrong move and  has a hard time stopping herself.  Pt states that her L leg bothers her (dermatology thing). 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  The leg is not healing as well as it should. Has diabetes.  Does not bother her when she walks.   Pt also adds that fatigue might play a factor with her falls.   Patient Stated Goals Be able to go back and ride her bike, roller skate, go shopping (for longer hours (more than half a day)   Currently in Pain? Other (Comment)  No complain of back pain today   Pain Onset More than a month ago                                 PT Education - 02/04/17 1008    Education provided Yes   Education Details ther-ex   [employer]) Educated Patient   Methods  Explanation;Demonstration;Tactile cues;Verbal cues   Comprehension Returned demonstration;Verbalized understanding        Objectives  Pt observed to ambulate to and from gym with walking stick.   There-ex   Directed patient with standing backwards walking 5 ft with one UE assist to light touch assist 9x for balance  Seated bilateral scapular retraction 10x2 with 5 second holds to promote thoracic extension  BP L arm sitting, mechanically taken: 120/56, HR 74. Measured secondary to pt stating feeling "foggy" earlier.   Standing hip abduction with bilateral UE assist 10x2. R LE on Air Ex pad. L leg fatigue afterwards.   Seated kneee extension 5x2 with 10 seconds each LE to promote quad strengthening in non weight bearing (for knees)  Standing bilateral scapular rows resisting yellow band 10x5 seconds  Then on Air Ex pad 10x2 for balance and decrease pressure to R knee  Sitting with proper posture: gentle manual perturbation from PT through PVC rod 5x10 seconds, then 6x10 seconds to promote trunk strengthening    Improved exercise technique, movement at target joints, use of target muscles after mod verbal, visual, tactile cues.     Initial feeling of "foggy" after performing backwards walk exercise. No symptoms with sitting and rest. No symptoms rest of sessoin. No complain of increased back pain. Some difficulty with balance when on R LE at times, possibly due to R knee pain/arthritis. Worked on backwards walking earlier secondary to pt LOB x 1 with min A to recover when pt trying to step back from her chair.            PT Long Term Goals - 01/20/17 1149      PT LONG TERM GOAL #1   Title Patient will have a decrease in back pain to 3/10 or less at most to promote ability to perform standing tasks such as cooking.    Baseline 6/10 at most for the past 2 months (01/20/2017)   Time 6   Period Weeks   Status New     PT LONG TERM GOAL #2   Title Patient will improve her  Modified Oswestry Low Back Pain Disability Questionnaire by at least 12% as a demonstration of improved function.    Baseline 34% (01/20/2017)   Time 6   Period Weeks   Status New     PT LONG TERM GOAL #3   Title Patient will improve her LEFS score by at least 9 points as a demonnstration of improved function.    Baseline  54/80 (01/20/2017)   Time 6   Period Weeks   Status New     PT LONG TERM GOAL #4   Title Pt will improve bilateral LE strength by at least 1/2 MMT grade to promote more steadiness with gait, decrease fall risk, and improve ability to perform standing tasks.    Time 6   Period Weeks   Status New     PT LONG TERM GOAL #5   Title Pt will improve her 5 times sit to stand time without using hands to 11 seconds or less to promote balance.   Baseline 12 seconds with use of hands (01/20/2017)   Time 6   Period Weeks   Status New               Plan - 02/04/17 0944    Clinical Impression Statement Initial feeling of "foggy" after performing backwards walk exercise. No symptoms with sitting and rest. No symptoms rest of sessoin. No complain of increased back pain. Some difficulty with balance when on R LE at times, possibly due to R knee pain/arthritis. Worked on backwards walking earlier secondary to pt LOB x 1 with min A to recover when pt trying to step back from her chair.     Rehab Potential Fair   Clinical Impairments Affecting Rehab Potential chronicity of condition   PT Frequency 2x / week   PT Duration 6 weeks   PT Treatment/Interventions Therapeutic exercise;Therapeutic activities;Manual techniques;Patient/family education;Neuromuscular re-education;Ultrasound;Gait training;Iontophoresis 4mg /ml Dexamethasone;Electrical Stimulation;Aquatic Therapy   PT Next Visit Plan LE strengthenig, balance, thoracic extension, scapular strengthening, core strengthening   Consulted and Agree with Plan of Care Patient      Patient will benefit from skilled therapeutic  intervention in order to improve the following deficits and impairments:  Pain, Postural dysfunction, Improper body mechanics, Difficulty walking, Decreased strength, Decreased balance  Visit Diagnosis: Chronic bilateral low back pain, with sciatica presence unspecified  History of falling  Muscle weakness (generalized)  Unsteadiness on feet     Problem List There are no active problems to display for this patient.  Joneen Boers PT, DPT   02/04/2017, 2:53 PM  [facility] [address], [place], [address] Phone: [phone]   Fax:  [phone]  Name: Kelsey Haynes MRN: [id number] Date of Birth: [birth year]

## 2017-02-05 ENCOUNTER — Other Ambulatory Visit: Payer: Self-pay | Admitting: Internal Medicine

## 2017-02-05 ENCOUNTER — Ambulatory Visit
Admission: RE | Admit: 2017-02-05 | Discharge: 2017-02-05 | Disposition: A | Discharge status: Home / Self Care | Payer: Medicare Other | Source: Ambulatory Visit | Attending: Internal Medicine | Admitting: Internal Medicine

## 2017-02-05 DIAGNOSIS — Z1239 Encounter for other screening for malignant neoplasm of breast: Secondary | ICD-10-CM

## 2017-02-10 ENCOUNTER — Ambulatory Visit: Payer: Medicare Other

## 2017-02-11 ENCOUNTER — Ambulatory Visit: Payer: Medicare Other

## 2017-02-11 DIAGNOSIS — M6281 Muscle weakness (generalized): Secondary | ICD-10-CM

## 2017-02-11 DIAGNOSIS — M545 Low back pain: Principal | ICD-10-CM

## 2017-02-11 DIAGNOSIS — G8929 Other chronic pain: Secondary | ICD-10-CM

## 2017-02-11 DIAGNOSIS — R2681 Unsteadiness on feet: Secondary | ICD-10-CM

## 2017-02-11 DIAGNOSIS — Z9181 History of falling: Secondary | ICD-10-CM

## 2017-02-11 NOTE — Patient Instructions (Signed)
    Perform in sitting   Scapular Retraction: Rowing (Eccentric) - Arms - 45 Degrees (Resistance Band)   Sitting on a chair:   Hold end of band in each hand. Pull back until elbows are even with trunk.   Hold for 5 seconds.   Use ___yellow_____ resistance band. _10__ reps per set, _1__ sets per session, 3 session per day. Copyright  [employer]. All rights reserved.

## 2017-02-11 NOTE — Therapy (Signed)
[facility] [address] [place], [address] Phone: [phone]   Fax:  [phone]  Physical Therapy Treatment  Patient Details  Name: Kelsey Haynes MRN: [id number] Date of Birth: September 01, 1932 Referring Provider: Glendon Axe, MD  Encounter Date: 02/11/2017      PT End of Session - 02/11/17 1632    Visit Number 6   Number of Visits 13   Date for PT Re-Evaluation 03/04/17   Authorization Type 6   Authorization Time Period of 10 g code   PT Start Time [id number]   PT Stop Time 1632   PT Time Calculation (min) 45 min   Equipment Utilized During Treatment Gait belt   Activity Tolerance Patient tolerated treatment well   Behavior During Therapy [facility] for tasks assessed/performed      Past Medical History:  Diagnosis Date  . Cancer [facility])    breast cancer 2006  . Depression   . Diabetes mellitus without complication ([place])   . Hypertension     Past Surgical History:  Procedure Laterality Date  . ABDOMINAL HYSTERECTOMY    . endoscopic carpal tunnel release    . HALLUX VALGUS CORRECTION Left 1983  . HERNIA REPAIR     x2  . left foot surgery Left   . SHOULDER SURGERY Bilateral   . SPINE SURGERY     back    There were no vitals filed for this visit.      Subjective Assessment - 02/11/17 1550    Subjective No back pain currently. Often wake up in pain. Has a sleep number bed for 25 years. The foam topper on her bed might be too soft.   Her husband made her mattress more firm which helped.  5/10 back pain at most for the past 7 days. 8-9/10 when she first wakes up in the morning but gets better throughout the day. This morning pt woke up without back pain.    Pertinent History DDD, imbalance. Pt states recently moving here from [place] to the [employer]. Back pain recently started again (had back surgery 2006 which helped before). Feels like her age is catching up on her. Bending forward, cooking  bothers her. Pt sometimes feel like she has to use the bathroom more often but has not become enough of a problem for her. Has control of bladder or bowel function. Woke up with a headache and a slight fever this past Monday, went to [place] (was given a fall risk bracelet). Feels fine today. Pt states having problems adjusting to the humidity, got a humidifier, which helped her headaches.  Pt states having balance problems for the past 10 years. Had a fall 1 month ago due to tripping on something. Feels like if she is not careful, she falls. Has never been hurt in a fall before. Her neurologist in [place] recommended for her to use a SPC.  Pt states that she does not have osteoporosis. Had PT for balance before which helped. Feels like her balance is somewhat better due to prior PT.  Has not noticed light headedness or dizziness when she falls. Just feels like she makes a wrong move and has a hard time stopping herself.  Pt states that her L leg bothers her (dermatology thing). 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  The leg is not healing as well as it should. Has diabetes.  Does not bother her when she walks.  Pt also adds that fatigue might play a factor with her falls.   Patient Stated Goals Be able to go back and ride her bike, roller skate, go shopping (for longer hours (more than half a day)   Currently in Pain? No/denies   Pain Onset More than a month ago                                 PT Education - 02/11/17 1618    Education provided Yes   Education Details ther-ex, HEP   Person(s) Educated Patient   Methods Explanation;Demonstration;Tactile cues;Verbal cues;Handout   Comprehension Returned demonstration;Verbalized understanding        Objectives  There-ex   Seated bilateral scapular retraction 10x5 seconds  Gait around the gym without UE assist 100 ft, then 140 ft CGA  Forward walking over ladder drill ladder. 4x for  obstacle negotiation  Ladder drill (slow)  Forward in and out 2x for obstacle negotiation  Sitting with core activation: manual perturbation from PT 30 seconds x 5  Ladder drill (slow)  Lateral in and out 1x each direction CGA to Min A. LOB x 1 backward. No knee pain.  Seated bilateral scapular rows 10x5 seconds, then 2x5 with 5 seconds.     Reviewed and given as part of her HEP. Pt demonstrated and verbalized understanding.     Improved exercise technique, movement at target joints, use of target muscles after mod verbal, visual, tactile cues.     Patient continues to arrive to PT without complain of back pain. Worked on strengthening her trunk muscles for her back as well as added more dynamic balance exercises to help decrease fall risk. Loss of balance x 1 due to backwards lean with slow ladder drills. No complain of increased back or L LE pain throughout session.        PT Long Term Goals - 01/20/17 1149      PT LONG TERM GOAL #1   Title Patient will have a decrease in back pain to 3/10 or less at most to promote ability to perform standing tasks such as cooking.    Baseline 6/10 at most for the past 2 months (01/20/2017)   Time 6   Period Weeks   Status New     PT LONG TERM GOAL #2   Title Patient will improve her Modified Oswestry Low Back Pain Disability Questionnaire by at least 12% as a demonstration of improved function.    Baseline 34% (01/20/2017)   Time 6   Period Weeks   Status New     PT LONG TERM GOAL #3   Title Patient will improve her LEFS score by at least 9 points as a demonnstration of improved function.    Baseline 54/80 (01/20/2017)   Time 6   Period Weeks   Status New     PT LONG TERM GOAL #4   Title Pt will improve bilateral LE strength by at least 1/2 MMT grade to promote more steadiness with gait, decrease fall risk, and improve ability to perform standing tasks.    Time 6   Period Weeks   Status New     PT LONG TERM GOAL #5   Title Pt  will improve her 5 times sit to stand time without using hands to 11 seconds or less to promote balance.   Baseline 12 seconds with use of hands (01/20/2017)   Time 6  Period Weeks   Status New               Plan - 02/11/17 1641    Clinical Impression Statement Patient continues to arrive to PT without complain of back pain. Worked on strengthening her trunk muscles for her back as well as added more dynamic balance exercises to help decrease fall risk. Loss of balance x 1 due to backwards lean with slow ladder drills. No complain of increased back or L LE pain throughout session.    Rehab Potential Fair   Clinical Impairments Affecting Rehab Potential chronicity of condition   PT Frequency 2x / week   PT Duration 6 weeks   PT Treatment/Interventions Therapeutic exercise;Therapeutic activities;Manual techniques;Patient/family education;Neuromuscular re-education;Ultrasound;Gait training;Iontophoresis 4mg /ml Dexamethasone;Electrical Stimulation;Aquatic Therapy   PT Next Visit Plan LE strengthenig, balance, thoracic extension, scapular strengthening, core strengthening   Consulted and Agree with Plan of Care Patient      Patient will benefit from skilled therapeutic intervention in order to improve the following deficits and impairments:  Pain, Postural dysfunction, Improper body mechanics, Difficulty walking, Decreased strength, Decreased balance  Visit Diagnosis: Chronic bilateral low back pain, with sciatica presence unspecified  History of falling  Muscle weakness (generalized)  Unsteadiness on feet     Problem List There are no active problems to display for this patient.   Joneen Boers PT, DPT   02/11/2017, 4:52 PM  [facility] [address] [place], [address] Phone: [phone]   Fax:  [phone]  Name: Kelsey Haynes MRN: [id number] Date of Birth: [birth year]

## 2017-02-15 ENCOUNTER — Ambulatory Visit: Payer: Medicare Other

## 2017-02-15 DIAGNOSIS — M6281 Muscle weakness (generalized): Secondary | ICD-10-CM

## 2017-02-15 DIAGNOSIS — Z9181 History of falling: Secondary | ICD-10-CM

## 2017-02-15 DIAGNOSIS — R2681 Unsteadiness on feet: Secondary | ICD-10-CM

## 2017-02-15 DIAGNOSIS — G8929 Other chronic pain: Secondary | ICD-10-CM

## 2017-02-15 DIAGNOSIS — M545 Low back pain: Principal | ICD-10-CM

## 2017-02-15 NOTE — Therapy (Signed)
[facility] [address] [place], [address] Phone: [phone]   Fax:  [phone]  Physical Therapy Treatment  Patient Details  Name: Kelsey Haynes MRN: [id number] Date of Birth: Dec 08, 1932 Referring Provider: Glendon Axe, MD  Encounter Date: 02/15/2017      PT End of Session - 02/15/17 1341    Visit Number 7   Number of Visits 13   Date for PT Re-Evaluation 03/04/17   Authorization Type 7   Authorization Time Period of 10 g code   PT Start Time 1341   PT Stop Time 1430   PT Time Calculation (min) 49 min   Equipment Utilized During Treatment Gait belt   Activity Tolerance Patient tolerated treatment well   Behavior During Therapy [facility] for tasks assessed/performed      Past Medical History:  Diagnosis Date  . Cancer [facility])    breast cancer 2006  . Depression   . Diabetes mellitus without complication ([place])   . Hypertension     Past Surgical History:  Procedure Laterality Date  . ABDOMINAL HYSTERECTOMY    . endoscopic carpal tunnel release    . HALLUX VALGUS CORRECTION Left 1983  . HERNIA REPAIR     x2  . left foot surgery Left   . SHOULDER SURGERY Bilateral   . SPINE SURGERY     back    There were no vitals filed for this visit.      Subjective Assessment - 02/15/17 1343    Subjective Pt states that her dizziness has gotten worse. Has not had problems with her dizziness type balance for 5-6 years.  The room feels like it's spinning. Has been doing a lot of double steps, staying close to what she can grab today.  Getting her head to the pillow when trying to get to bed reproduces symptoms. Walking around feels like she can fall at times because of the dizziness.  The L leg with the wound does not feel bad.  Back has not bothered her.   Pt also states that both her ears feel full for the past week.    Pertinent History DDD, imbalance. Pt states recently moving here from [place] to the  [employer]. Back pain recently started again (had back surgery 2006 which helped before). Feels like her age is catching up on her. Bending forward, cooking bothers her. Pt sometimes feel like she has to use the bathroom more often but has not become enough of a problem for her. Has control of bladder or bowel function. Woke up with a headache and a slight fever this past Monday, went to [place] (was given a fall risk bracelet). Feels fine today. Pt states having problems adjusting to the humidity, got a humidifier, which helped her headaches.  Pt states having balance problems for the past 10 years. Had a fall 1 month ago due to tripping on something. Feels like if she is not careful, she falls. Has never been hurt in a fall before. Her neurologist in [place] recommended for her to use a SPC.  Pt states that she does not have osteoporosis. Had PT for balance before which helped. Feels like her balance is somewhat better due to prior PT.  Has not noticed light headedness or dizziness when she falls. Just feels like she makes a wrong move and has a hard time stopping herself.  Pt states that her L leg bothers her (dermatology thing). Had a biopsy for her  L leg which revealed a squamous cell carcinoma.  The leg is not healing as well as it should. Has diabetes.  Does not bother her when she walks.   Pt also adds that fatigue might play a factor with her falls.   Patient Stated Goals Be able to go back and ride her bike, roller skate, go shopping (for longer hours (more than half a day)   Currently in Pain? No/denies   Pain Score 0-No pain   Pain Onset More than a month ago            [facility] PT Assessment - 02/15/17 1718      Observation/Other Assessments   Observations (-) modified seated VBI test. No dizziness.   Dizziness when getting out of the [facility] position testing the R side.  Symptoms reproduced.                              PT Education - 02/15/17 1352     Education provided Yes   Education Details ther-ex   [employer]) Educated Patient   Methods Demonstration;Explanation;Tactile cues;Verbal cues   Comprehension Verbalized understanding;Returned demonstration        Objectives  There-ex and vestibular (canalith repositioning)   Vitals obtained: blood pressure L arm sitting 128/53, HR 98 BPM, mechanically taken  Static unsupported standing x 2 min  Blood pressure L arm standing, mechanically taken: 111/51, HR 97  Sit > supine. Dizziness, body feels like it is spinning (lasted for about a minute)  Blood pressure L arm supine after 2 min 140/68, HR 95  Supine > sit, minor dizziness  (-) modified seated VBI. No symptoms   Dix hallpike R side: no dizziness in supine position but dizziness with getting out of it. Reproduction of symptoms  Dix hallpike L side: no dizziness in supine position or when getting out of it.    Epley maneuver for R side x3 to help with dizziness. No complain of dizziness throughout  Pt was recommended to get her ears checked out secondary to feeling of fullness. Pt verbalized understanding.   Supine <> sit at least 2x  Decreased dizziness after Epley maneuver.      Improved exercise technique, movement at target joints, use of target muscles after mod verbal, visual, tactile cues.     Possible inner ear involvement with balance. Pt states feeling less dizzy and more strong after session.              PT Long Term Goals - 01/20/17 1149      PT LONG TERM GOAL #1   Title Patient will have a decrease in back pain to 3/10 or less at most to promote ability to perform standing tasks such as cooking.    Baseline 6/10 at most for the past 2 months (01/20/2017)   Time 6   Period Weeks   Status New     PT LONG TERM GOAL #2   Title Patient will improve her Modified Oswestry Low Back Pain Disability Questionnaire by at least 12% as a demonstration of improved function.    Baseline 34% (01/20/2017)    Time 6   Period Weeks   Status New     PT LONG TERM GOAL #3   Title Patient will improve her LEFS score by at least 9 points as a demonnstration of improved function.    Baseline 54/80 (01/20/2017)   Time 6   Period Weeks  Status New     PT LONG TERM GOAL #4   Title Pt will improve bilateral LE strength by at least 1/2 MMT grade to promote more steadiness with gait, decrease fall risk, and improve ability to perform standing tasks.    Time 6   Period Weeks   Status New     PT LONG TERM GOAL #5   Title Pt will improve her 5 times sit to stand time without using hands to 11 seconds or less to promote balance.   Baseline 12 seconds with use of hands (01/20/2017)   Time 6   Period Weeks   Status New               Plan - 02/15/17 1353    Clinical Impression Statement Possible inner ear involvement with balance. Pt states feeling less dizzy and more strong after session.     Rehab Potential Fair   Clinical Impairments Affecting Rehab Potential chronicity of condition   PT Frequency 2x / week   PT Duration 6 weeks   PT Treatment/Interventions Therapeutic exercise;Therapeutic activities;Manual techniques;Patient/family education;Neuromuscular re-education;Ultrasound;Gait training;Iontophoresis 4mg /ml Dexamethasone;Electrical Stimulation;Aquatic Therapy   PT Next Visit Plan LE strengthenig, balance, thoracic extension, scapular strengthening, core strengthening   Consulted and Agree with Plan of Care Patient      Patient will benefit from skilled therapeutic intervention in order to improve the following deficits and impairments:  Pain, Postural dysfunction, Improper body mechanics, Difficulty walking, Decreased strength, Decreased balance  Visit Diagnosis: Chronic bilateral low back pain, with sciatica presence unspecified  History of falling  Muscle weakness (generalized)  Unsteadiness on feet     Problem List There are no active problems to display for this  patient.  Joneen Boers PT, DPT  02/15/2017, 5:32 PM  [facility] [address], [place], [address] Phone: [phone]   Fax:  [phone]  Name: Kelsey Haynes MRN: [id number] Date of Birth: [birth year]

## 2017-02-17 ENCOUNTER — Inpatient Hospital Stay
Admission: RE | Admit: 2017-02-17 | Discharge: 2017-02-17 | Disposition: A | Discharge status: Home / Self Care | Payer: Self-pay | Source: Ambulatory Visit | Attending: *Deleted | Admitting: *Deleted

## 2017-02-17 ENCOUNTER — Other Ambulatory Visit: Payer: Self-pay | Admitting: *Deleted

## 2017-02-17 DIAGNOSIS — Z9289 Personal history of other medical treatment: Secondary | ICD-10-CM

## 2017-02-18 ENCOUNTER — Ambulatory Visit: Payer: Medicare Other

## 2017-02-18 DIAGNOSIS — Z9181 History of falling: Secondary | ICD-10-CM

## 2017-02-18 DIAGNOSIS — M6281 Muscle weakness (generalized): Secondary | ICD-10-CM

## 2017-02-18 DIAGNOSIS — G8929 Other chronic pain: Secondary | ICD-10-CM

## 2017-02-18 DIAGNOSIS — M545 Low back pain: Principal | ICD-10-CM

## 2017-02-18 DIAGNOSIS — R2681 Unsteadiness on feet: Secondary | ICD-10-CM

## 2017-02-18 NOTE — Therapy (Signed)
[facility] [address] [place], [address] Phone: [phone]   Fax:  [phone]  Physical Therapy Treatment  Patient Details  Name: Kelsey Haynes MRN: [id number] Date of Birth: [birth year] Referring Provider: Glendon Axe, MD  Encounter Date: 02/18/2017      PT End of Session - 02/18/17 1258    Visit Number 8   Number of Visits 13   Date for PT Re-Evaluation 03/04/17   Authorization Type 8   Authorization Time Period of 10 g code   PT Start Time 1258   PT Stop Time 1344   PT Time Calculation (min) 46 min   Equipment Utilized During Treatment Gait belt   Activity Tolerance Patient tolerated treatment well   Behavior During Therapy [facility] for tasks assessed/performed      Past Medical History:  Diagnosis Date  . Cancer [facility])    breast cancer 2006  . Depression   . Diabetes mellitus without complication ([place])   . Hypertension     Past Surgical History:  Procedure Laterality Date  . ABDOMINAL HYSTERECTOMY    . endoscopic carpal tunnel release    . HALLUX VALGUS CORRECTION Left 1983  . HERNIA REPAIR     x2  . left foot surgery Left   . SHOULDER SURGERY Bilateral   . SPINE SURGERY     back    There were no vitals filed for this visit.      Subjective Assessment - 02/18/17 1300    Subjective Pt states being really dizzy after leaving last session. Pt laid down for a while. Got better since then. The level of dizziness has improved since then however. Not as dizzy as before. Currently using walking sticks for balance. Uses the walker in her building. Has to be somewhere after PT. Trying to get out a couple of minutes early.  Her head feels clearer today.  Pt also doing the NuStep at the assisted living facility. 3-4/10 back pain at most for the past 7 days. Usually bad when she gets out of bed, did not bother her this morning.  The wound in her L leg is healing.    Pertinent History DDD,  imbalance. Pt states recently moving here from [place] to the [employer]. Back pain recently started again (had back surgery 2006 which helped before). Feels like her age is catching up on her. Bending forward, cooking bothers her. Pt sometimes feel like she has to use the bathroom more often but has not become enough of a problem for her. Has control of bladder or bowel function. Woke up with a headache and a slight fever this past Monday, went to [place] (was given a fall risk bracelet). Feels fine today. Pt states having problems adjusting to the humidity, got a humidifier, which helped her headaches.  Pt states having balance problems for the past 10 years. Had a fall 1 month ago due to tripping on something. Feels like if she is not careful, she falls. Has never been hurt in a fall before. Her neurologist in [place] recommended for her to use a SPC.  Pt states that she does not have osteoporosis. Had PT for balance before which helped. Feels like her balance is somewhat better due to prior PT.  Has not noticed light headedness or dizziness when she falls. Just feels like she makes a wrong move and has a hard time stopping herself.  Pt states that her L leg bothers her (  dermatology thing). 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  The leg is not healing as well as it should. Has diabetes.  Does not bother her when she walks.   Pt also adds that fatigue might play a factor with her falls.   Patient Stated Goals Be able to go back and ride her bike, roller skate, go shopping (for longer hours (more than half a day)   Currently in Pain? No/denies   Pain Score 0-No pain   Pain Onset More than a month ago                                 PT Education - 02/18/17 1336    Education provided Yes   Education Details ther-ex   [employer]) Educated Patient   Methods Explanation;Demonstration;Tactile cues;Verbal cues   Comprehension Verbalized  understanding;Returned demonstration        Objectives  There-ex  Canalith repositioning held today secondary to increased dizziness after last session for pt safety.    Seated bilateral scapular rows 10x5 seconds for 2 sets to promote thoracic extension  Standing alternating toe taps onto treadmill platform 10x3 each LE with occasional light touch assist. Improved with practice  Ladder drill (slow) CGA             Forward in and out 4x for obstacle negotiation   Good single leg balance observed (2 seconds on one leg, no LOB)   Ladder drill (slow)             Lateral in and out 2x each direction CGA   LOB x 1 during first set secondary to backward lean. No LOB during second set.   Sitting with core activation: manual perturbation from PT 30 seconds x 5  Standing with one foot propped on treadmill platform to promote single leg balance, no UE assits 30 seconds x 3 each LE.    Improved exercise technique, movement at target joints, use of target muscles after mod verbal, visual, tactile cues.     Improved overall dizziness per pt reports since last session. Decreased back pain level at worst overall since initial evaluation. Continued working on balance related activities to help decrease fall risk in addition to improving thoracic mobility and trunk strength to help with back pain.                PT Long Term Goals - 01/20/17 1149      PT LONG TERM GOAL #1   Title Patient will have a decrease in back pain to 3/10 or less at most to promote ability to perform standing tasks such as cooking.    Baseline 6/10 at most for the past 2 months (01/20/2017)   Time 6   Period Weeks   Status New     PT LONG TERM GOAL #2   Title Patient will improve her Modified Oswestry Low Back Pain Disability Questionnaire by at least 12% as a demonstration of improved function.    Baseline 34% (01/20/2017)   Time 6   Period Weeks   Status New     PT LONG TERM GOAL #3   Title  Patient will improve her LEFS score by at least 9 points as a demonnstration of improved function.    Baseline 54/80 (01/20/2017)   Time 6   Period Weeks   Status New     PT LONG TERM GOAL #  4   Title Pt will improve bilateral LE strength by at least 1/2 MMT grade to promote more steadiness with gait, decrease fall risk, and improve ability to perform standing tasks.    Time 6   Period Weeks   Status New     PT LONG TERM GOAL #5   Title Pt will improve her 5 times sit to stand time without using hands to 11 seconds or less to promote balance.   Baseline 12 seconds with use of hands (01/20/2017)   Time 6   Period Weeks   Status New               Plan - 02/18/17 1253    Clinical Impression Statement Improved overall dizziness per pt reports since last session. Decreased back pain level at worst overall since initial evaluation. Continued working on balance related activities to help decrease fall risk in addition to improving thoracic mobility and trunk strength to help with back pain.    Rehab Potential Fair   Clinical Impairments Affecting Rehab Potential chronicity of condition   PT Frequency 2x / week   PT Duration 6 weeks   PT Treatment/Interventions Therapeutic exercise;Therapeutic activities;Manual techniques;Patient/family education;Neuromuscular re-education;Ultrasound;Gait training;Iontophoresis 4mg /ml Dexamethasone;Electrical Stimulation;Aquatic Therapy   PT Next Visit Plan LE strengthenig, balance, thoracic extension, scapular strengthening, core strengthening   Consulted and Agree with Plan of Care Patient      Patient will benefit from skilled therapeutic intervention in order to improve the following deficits and impairments:  Pain, Postural dysfunction, Improper body mechanics, Difficulty walking, Decreased strength, Decreased balance  Visit Diagnosis: Chronic bilateral low back pain, with sciatica presence unspecified  History of falling  Muscle weakness  (generalized)  Unsteadiness on feet     Problem List There are no active problems to display for this patient.   Joneen Boers PT, DPT   02/18/2017, 8:56 PM  [facility] [address], [place], [address] Phone: [phone]   Fax:  [phone]  Name: Kelsey Haynes MRN: [id number] Date of Birth: 28-Nov-1932

## 2017-02-22 ENCOUNTER — Ambulatory Visit: Payer: Medicare Other

## 2017-02-22 DIAGNOSIS — Z9181 History of falling: Secondary | ICD-10-CM

## 2017-02-22 DIAGNOSIS — M6281 Muscle weakness (generalized): Secondary | ICD-10-CM

## 2017-02-22 DIAGNOSIS — M545 Low back pain: Secondary | ICD-10-CM | POA: Diagnosis not present

## 2017-02-22 DIAGNOSIS — G8929 Other chronic pain: Secondary | ICD-10-CM

## 2017-02-22 DIAGNOSIS — R2681 Unsteadiness on feet: Secondary | ICD-10-CM

## 2017-02-22 NOTE — Therapy (Signed)
[place] [address], [place], [address] Phone: [phone]   Fax:  [phone]  Physical Therapy Treatment  Patient Details  Name: Kelsey Haynes MRN: [id number] Date of Birth: 25-Nov-1932 Referring Provider: Glendon Axe, MD  Encounter Date: 02/22/2017      PT End of Session - 02/22/17 1341    Visit Number 9   Number of Visits 13   Date for PT Re-Evaluation 03/04/17   Authorization Type 9   Authorization Time Period of 10 g code   PT Start Time [id number]   PT Stop Time 1429   PT Time Calculation (min) 46 min   Equipment Utilized During Treatment Gait belt   Activity Tolerance Patient tolerated treatment well   Behavior During Therapy [facility] for tasks assessed/performed      Past Medical History:  Diagnosis Date  . Cancer [facility])    breast cancer 2006  . Depression   . Diabetes mellitus without complication ([place])   . Hypertension     Past Surgical History:  Procedure Laterality Date  . ABDOMINAL HYSTERECTOMY    . endoscopic carpal tunnel release    . HALLUX VALGUS CORRECTION Left 1983  . HERNIA REPAIR     x2  . left foot surgery Left   . SHOULDER SURGERY Bilateral   . SPINE SURGERY     back    There were no vitals filed for this visit.      Subjective Assessment - 02/22/17 1345    Subjective Pt states that her body felt like it was weaving yesterday during the night and first thing this morning. Body feels "drunk."  Does not know if there was something in the food she ate. Does not drink alcohol. The weaving feels better in the morning. Felt like she could not control her body to walk straight. Feels like her dizziness might be connected. Ears feel like they are popping curently. Does not remember her ears having any problem with it yesterday. Feels a slight bit of wooziness getting in and out of bed. The "foggy" feeling feels like a headache without pain.  Has an appoingment with Dr. Glendon Axe this afternoon. Back pain has not been good today. At least a 5/10 currently at both outer waist areas. 3-4/10 at most usually for the past 7 days. The drunken feeling occurred during the middle of the night yesterday/this morning     Pertinent History DDD, imbalance. Pt states recently moving here from [place] to the [employer]. Back pain recently started again (had back surgery 2006 which helped before). Feels like her age is catching up on her. Bending forward, cooking bothers her. Pt sometimes feel like she has to use the bathroom more often but has not become enough of a problem for her. Has control of bladder or bowel function. Woke up with a headache and a slight fever this past Monday, went to [place] (was given a fall risk bracelet). Feels fine today. Pt states having problems adjusting to the humidity, got a humidifier, which helped her headaches.  Pt states having balance problems for the past 10 years. Had a fall 1 month ago due to tripping on something. Feels like if she is not careful, she falls. Has never been hurt in a fall before. Her neurologist in [place] recommended for her to use a SPC.  Pt states that she does not have osteoporosis. Had PT for balance before which helped. Feels like her balance  is somewhat better due to prior PT.  Has not noticed light headedness or dizziness when she falls. Just feels like she makes a wrong move and has a hard time stopping herself.  Pt states that her L leg bothers her (dermatology thing). 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  The leg is not healing as well as it should. Has diabetes.  Does not bother her when she walks.   Pt also adds that fatigue might play a factor with her falls.   Patient Stated Goals Be able to go back and ride her bike, roller skate, go shopping (for longer hours (more than half a day)   Currently in Pain? Yes   Pain Score 5    Pain Onset More than a month ago            [facility] PT  Assessment - 02/22/17 1355      Observation/Other Assessments   Observations 13.24 seconds for 5 times sit to stand     Strength   Right Hip Flexion 4+/5   Right Hip ABduction 4-/5   Left Hip Flexion 4+/5   Left Hip ABduction 4/5   Right Knee Flexion 4+/5   Right Knee Extension 5/5   Left Knee Flexion 4+/5   Left Knee Extension 4+/5                             PT Education - 02/22/17 1354    Education provided Yes   Education Details ther-ex   Person(s) Educated Patient   Methods Explanation;Demonstration;Tactile cues;Verbal cues   Comprehension Returned demonstration;Verbalized understanding        Objectives    There-ex   Directed patient with seated hip flexion, knee flexion, knee extension, S/L hip abduction 1-2x each way for each LE.   Reviewed progress/current status with LE strength with pt.   Sit to L S/L. Dizziness for 20 seconds    Sit <> stand 4x with bilateral UE assist, then 5x   13.24 seconds 5 times sit to stand  Seated hip extension isometrics 5x5 seconds each LE for 3 sets, towel under thighs to not put pressure on L leg wound area when pressing foot on floor.   Seated bilateral scapular retraction resisting yellow band 10x5 seconds to promote scapular retraction and thoracic extension.   Sitting with proper posture and transversus abdominis contraction: gentle manual perturbation frpom PT 30 seconds x 5. Slight reproduction of bilateral posterior low back symptoms which eases with rest.   Forward step up onto Air Ex pad with R LE, L UE assist to promote R LE strength with less stress to R knee and for balance.    Improved exercise technique, movement at target joints, use of target muscles after min to mod verbal, visual, tactile cues.       Pt demonstrates improved L hip abduction, L hip flexion, R knee flexion and extension strength since initial evaluation. Dizziness with sit to supine, R S/L to supine, supine to sit.  Eases after a few seconds of rest. Possible vestibular involvement. Might try canalith repositioning technique next visit secondary to pt stating that it helped. Pt was recommended to have someone help her afterwards if she is dizzy for safety. Pt verbalized understanding.                   PT Long Term Goals - 01/20/17 1149  PT LONG TERM GOAL #1   Title Patient will have a decrease in back pain to 3/10 or less at most to promote ability to perform standing tasks such as cooking.    Baseline 6/10 at most for the past 2 months (01/20/2017)   Time 6   Period Weeks   Status New     PT LONG TERM GOAL #2   Title Patient will improve her Modified Oswestry Low Back Pain Disability Questionnaire by at least 12% as a demonstration of improved function.    Baseline 34% (01/20/2017)   Time 6   Period Weeks   Status New     PT LONG TERM GOAL #3   Title Patient will improve her LEFS score by at least 9 points as a demonnstration of improved function.    Baseline 54/80 (01/20/2017)   Time 6   Period Weeks   Status New     PT LONG TERM GOAL #4   Title Pt will improve bilateral LE strength by at least 1/2 MMT grade to promote more steadiness with gait, decrease fall risk, and improve ability to perform standing tasks.    Time 6   Period Weeks   Status New     PT LONG TERM GOAL #5   Title Pt will improve her 5 times sit to stand time without using hands to 11 seconds or less to promote balance.   Baseline 12 seconds with use of hands (01/20/2017)   Time 6   Period Weeks   Status New               Plan - 02/22/17 1339    Clinical Impression Statement Pt demonstrates improved L hip abduction, L hip flexion, R knee flexion and extension strength since initial evaluation. Dizziness with sit to supine, R S/L to supine, supine to sit. Eases after a few seconds of rest. Possible vestibular involvement. Might try canalith repositioning technique next visit secondary to pt stating  that it helped. Pt was recommended to have someone help her afterwards if she is dizzy for safety. Pt verbalized understanding.     Rehab Potential Fair   Clinical Impairments Affecting Rehab Potential chronicity of condition   PT Frequency 2x / week   PT Duration 6 weeks   PT Treatment/Interventions Therapeutic exercise;Therapeutic activities;Manual techniques;Patient/family education;Neuromuscular re-education;Ultrasound;Gait training;Iontophoresis 4mg /ml Dexamethasone;Electrical Stimulation;Aquatic Therapy   PT Next Visit Plan LE strengthenig, balance, thoracic extension, scapular strengthening, core strengthening   Consulted and Agree with Plan of Care Patient      Patient will benefit from skilled therapeutic intervention in order to improve the following deficits and impairments:  Pain, Postural dysfunction, Improper body mechanics, Difficulty walking, Decreased strength, Decreased balance  Visit Diagnosis: Chronic bilateral low back pain, with sciatica presence unspecified  History of falling  Muscle weakness (generalized)  Unsteadiness on feet     Problem List There are no active problems to display for this patient.   Joneen Boers PT, DPT   02/22/2017, 7:34 PM  [facility] [address], [place], [address] Phone: [phone]   Fax:  [phone]  Name: Jaquline Curl MRN: [id number] Date of Birth: [birth year]

## 2017-02-25 ENCOUNTER — Ambulatory Visit: Payer: Medicare Other

## 2017-03-03 ENCOUNTER — Ambulatory Visit: Payer: Medicare Other | Attending: Internal Medicine

## 2017-03-03 DIAGNOSIS — G8929 Other chronic pain: Secondary | ICD-10-CM | POA: Diagnosis present

## 2017-03-03 DIAGNOSIS — M545 Low back pain: Secondary | ICD-10-CM | POA: Diagnosis present

## 2017-03-03 DIAGNOSIS — R2681 Unsteadiness on feet: Secondary | ICD-10-CM | POA: Diagnosis present

## 2017-03-03 DIAGNOSIS — M6281 Muscle weakness (generalized): Secondary | ICD-10-CM | POA: Diagnosis present

## 2017-03-03 DIAGNOSIS — Z9181 History of falling: Secondary | ICD-10-CM

## 2017-03-03 NOTE — Therapy (Signed)
[place] [facility] [address], [place], [address] Phone: [phone]   Fax:  [phone]  Physical Therapy Treatment And Progress Report  Patient Details  Name: Kelsey Haynes MRN: [id number] Date of Birth: [birth year] Referring Provider: Glendon Axe, MD   Encounter Date: 03/03/2017      PT End of Session - 03/03/17 1305    Visit Number 10   Number of Visits 21   Date for PT Re-Evaluation 04/01/17   Authorization Type 1   Authorization Time Period of 10 g code   PT Start Time 4270   PT Stop Time 1350   PT Time Calculation (min) 45 min   Equipment Utilized During Treatment Gait belt   Activity Tolerance Patient tolerated treatment well   Behavior During Therapy [facility] for tasks assessed/performed      Past Medical History:  Diagnosis Date  . Cancer [facility])    breast cancer 2006  . Depression   . Diabetes mellitus without complication ([place])   . Hypertension     Past Surgical History:  Procedure Laterality Date  . ABDOMINAL HYSTERECTOMY    . endoscopic carpal tunnel release    . HALLUX VALGUS CORRECTION Left 1983  . HERNIA REPAIR     x2  . left foot surgery Left   . SHOULDER SURGERY Bilateral   . SPINE SURGERY     back    There were no vitals filed for this visit.      Subjective Assessment - 03/03/17 1307    Subjective Pt states calling [facility] about 2 weeks ago due to dizziness, was seen. Blood pressure medication was taken away because it was low.  Did an EKG. Bigeminy (state of having a pulse characterized by 2 beats close together with a pause following each pair of beats).  Because of the EKG revealed bigenimy, pt went to see a cardiologist. Wore a halter monitor for 48 hours. Still has 2 more test to do including one for the carotid artery, and the other one is an echocardiogram.  Dizziness is much better. Not wondering around when she is walking at all. Going lie down is when her  dizziness bothers her. Sits at the side of the bed when she gets up in the morning.  Walked without AD yesterday (from one side of the building to the other, occasional rail assist) and was fine.   3/10 back pain currently towards the R side, 5/10 back pain at most for the past 7 days higher in her back. Has pain but not consistenly at the same spot.    Pertinent History DDD, imbalance. Pt states recently moving here from [place] to the [employer]. Back pain recently started again (had back surgery 2006 which helped before). Feels like her age is catching up on her. Bending forward, cooking bothers her. Pt sometimes feel like she has to use the bathroom more often but has not become enough of a problem for her. Has control of bladder or bowel function. Woke up with a headache and a slight fever this past Monday, went to [place] (was given a fall risk bracelet). Feels fine today. Pt states having problems adjusting to the humidity, got a humidifier, which helped her headaches.  Pt states having balance problems for the past 10 years. Had a fall 1 month ago due to tripping on something. Feels like if she is not careful, she falls. Has never been hurt in a fall before. Her  neurologist in [place] recommended for her to use a SPC.  Pt states that she does not have osteoporosis. Had PT for balance before which helped. Feels like her balance is somewhat better due to prior PT.  Has not noticed light headedness or dizziness when she falls. Just feels like she makes a wrong move and has a hard time stopping herself.  Pt states that her L leg bothers her (dermatology thing). 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  The leg is not healing as well as it should. Has diabetes.  Does not bother her when she walks.   Pt also adds that fatigue might play a factor with her falls.   Patient Stated Goals Be able to go back and ride her bike, roller skate, go shopping (for longer hours (more than  half a day)   Currently in Pain? Yes   Pain Score 3    Pain Location Back   Pain Onset More than a month ago            [facility] PT Assessment - 03/03/17 1319      Observation/Other Assessments   Observations 11.45 seconds 5 times sit <> stand with bilateral UE assist.    Modified Oswertry 22%   Lower Extremity Functional Scale  57/80     Strength   Right Hip Flexion 4+/5   Left Hip Flexion 4+/5   Left Hip ABduction 4/5  measured on 02/22/2017   Right Knee Flexion 5/5   Right Knee Extension 5/5   Left Knee Flexion 4+/5   Left Knee Extension 5/5     Berg Balance Test   Sit to Stand Able to stand without using hands and stabilize independently   Standing Unsupported Able to stand safely 2 minutes   Sitting with Back Unsupported but Feet Supported on Floor or Stool Able to sit safely and securely 2 minutes   Stand to Sit Sits safely with minimal use of hands   Transfers Able to transfer safely, minor use of hands   Standing Unsupported with Eyes Closed Able to stand 10 seconds safely   Standing Ubsupported with Feet Together Able to place feet together independently and stand 1 minute safely   From Standing, Reach Forward with Outstretched Arm Can reach forward >12 cm safely (5")   From Standing Position, Pick up Object from Floor Able to pick up shoe safely and easily   From Standing Position, Turn to Look Behind Over each Shoulder Looks behind from both sides and weight shifts well   Turn 360 Degrees Able to turn 360 degrees safely but slowly   Standing Unsupported, Alternately Place Feet on Step/Stool Able to stand independently and safely and complete 8 steps in 20 seconds   Standing Unsupported, One Foot in Front Able to plae foot ahead of the other independently and hold 30 seconds   Standing on One Leg Unable to try or needs assist to prevent fall  able to standin R LE for > 10 seconds. LOB when on L LE   Total Score 48                              PT Education - 03/03/17 1818    Education provided Yes   Education Details ther-ex   Person(s) Educated Patient   Methods Explanation;Demonstration;Tactile cues;Verbal cues   Comprehension Returned demonstration;Verbalized understanding        Objectives  Pt also states getting an x-ray for her R knee which revealed arthritis. Pt adds feeling pain/discomfort in her L hand/wrist when turning her driver's wheel when driving.    There-ex  Directed patient with sit <> stand 5x with bilateral UE assist  11.45 seconds  Vitals obtained, blood pressure L arm sitting, mechanically taken: 141/60, HR 81   Directed patient with sit <> stand throughout session Stand pivot transfer chair <> low mat table Static standing shoulder width apart, then with eyes closed, then with eyes open feet together, tandem stance with feet shoulder width apart Picking up an object (computer mouse) from the floor,  Turning 360 degrees to the R and to the L 1x Looking behind to the R and to the L,  Standing forward reach,   Standing alternate toe taps 4 x each LE onto first regular step   Reviewed progress/current status with balance based on BERG test with pt.   Seated manually resisted hip flexion, knee extension, knee flexion 1x each way for each LE.   Reviewed progress/current status with LE strength with pt.    Improved exercise technique, movement at target joints, use of target muscles after mod verbal, visual, tactile cues.    Pt was suggested OT for L hand to help with L hand pain.    Pt demonstrates improved L hip abduction, R knee flexion, and bilateral knee extension strength, function (improved Modified Oswestry Low Back Pain Disability questionnaire score) as well as improved balance (improved 5 times sit to stand time and Berg Balance test) and slight decrease in back pain since initial evaluation. Less dizziness based on pt reports,  from working on heart related factors with her  doctors. Pt still demonstrates LE weakness, back pain, and difficulty performing functional tasks and would benefit from skilled physical therapy services to address the aforementioned deficits.          PT Long Term Goals - 03/03/17 1819      PT LONG TERM GOAL #1   Title Patient will have a decrease in back pain to 3/10 or less at most to promote ability to perform standing tasks such as cooking.    Baseline 6/10 at most for the past 2 months (01/20/2017); 5/10 back pain at most (03/03/2017)   Time 4   Period Weeks   Status On-going     PT LONG TERM GOAL #2   Title Patient will improve her Modified Oswestry Low Back Pain Disability Questionnaire by at least 12% as a demonstration of improved function.    Baseline 34% (01/20/2017); 22% (03/03/2017)   Time 4   Period Weeks   Status Achieved     PT LONG TERM GOAL #3   Title Patient will improve her LEFS score by at least 9 points as a demonnstration of improved function.    Baseline 54/80 (01/20/2017); 57/80 (03/03/2017)   Time 4   Period Weeks   Status On-going     PT LONG TERM GOAL #4   Title Pt will improve bilateral LE strength by at least 1/2 MMT grade to promote more steadiness with gait, decrease fall risk, and improve ability to perform standing tasks.    Time 4   Period Weeks   Status Partially Met     PT LONG TERM GOAL #5   Title Pt will improve her 5 times sit to stand time without using hands to 11 seconds or less to promote balance.   Baseline 12 seconds with use of hands (  01/20/2017); 11.45 seconds with use of hands (Mar 25, 2017)   Time 6   Period Weeks   Status Partially Met               Plan - 03-25-2017 1819    Clinical Impression Statement Pt demonstrates improved L hip abduction, R knee flexion, and bilateral knee extension strength, function (improved Modified Oswestry Low Back Pain Disability questionnaire score) as well as improved balance (improved 5 times sit to stand time and Berg Balance test) and slight  decrease in back pain since initial evaluation. Less dizziness based on pt reports,  from working on heart related factors with her doctors. Pt still demonstrates LE weakness, back pain, and difficulty performing functional tasks and would benefit from skilled physical therapy services to address the aforementioned deficits.    Rehab Potential Fair   Clinical Impairments Affecting Rehab Potential chronicity of condition   PT Frequency 2x / week   PT Duration 4 weeks   PT Treatment/Interventions Therapeutic exercise;Therapeutic activities;Manual techniques;Patient/family education;Neuromuscular re-education;Ultrasound;Gait training;Iontophoresis 20m/ml Dexamethasone;Electrical Stimulation;Aquatic Therapy   PT Next Visit Plan LE strengthenig, balance, thoracic extension, scapular strengthening, core strengthening   Consulted and Agree with Plan of Care Patient  pt states wanting to take care of her other doctor's appointments first      Patient will benefit from skilled therapeutic intervention in order to improve the following deficits and impairments:  Pain, Postural dysfunction, Improper body mechanics, Difficulty walking, Decreased strength, Decreased balance  Visit Diagnosis: Chronic bilateral low back pain, with sciatica presence unspecified - Plan: PT plan of care cert/re-cert  History of falling - Plan: PT plan of care cert/re-cert  Muscle weakness (generalized) - Plan: PT plan of care cert/re-cert  Unsteadiness on feet - Plan: PT plan of care cert/re-cert       G-Codes - 0Mar 29, [id number]    Functional Assessment Tool Used (Outpatient Only) Modified Oswestry Low Back Pain Disability Questionnaire, LEFS, clinical presentation, patient interview   Functional Limitation Mobility: Walking and moving around   Mobility: Walking and Moving Around Current Status ((J8119 At least 20 percent but less than 40 percent impaired, limited or restricted   Mobility: Walking and Moving Around Goal  Status ((J4782 At least 1 percent but less than 20 percent impaired, limited or restricted      Problem List There are no active problems to display for this patient.  Thank you for your referral.  MJoneen BoersPT, DPT  303/29/2018 6:35 PM  [facility] [address] [place] [address]Phone: [phone]  Fax:  [phone] Name: BZiona WickensMRN: [id number] of Birth: [birth year]

## 2017-03-08 ENCOUNTER — Ambulatory Visit: Payer: Medicare Other

## 2017-03-11 ENCOUNTER — Ambulatory Visit: Payer: Medicare Other

## 2017-03-16 ENCOUNTER — Ambulatory Visit: Payer: Medicare Other

## 2017-03-16 DIAGNOSIS — M545 Low back pain: Secondary | ICD-10-CM | POA: Diagnosis not present

## 2017-03-16 DIAGNOSIS — R2681 Unsteadiness on feet: Secondary | ICD-10-CM

## 2017-03-16 DIAGNOSIS — G8929 Other chronic pain: Secondary | ICD-10-CM

## 2017-03-16 DIAGNOSIS — Z9181 History of falling: Secondary | ICD-10-CM

## 2017-03-16 DIAGNOSIS — M6281 Muscle weakness (generalized): Secondary | ICD-10-CM

## 2017-03-16 NOTE — Therapy (Signed)
[place] [facility] [address] [place], [address] Phone: [phone]   Fax:  [phone]  Physical Therapy Treatment  Patient Details  Name: Kelsey Haynes MRN: [id number] Date of Birth: 06-24-1932 Referring Provider: Glendon Axe, MD  Encounter Date: 03/16/2017      PT End of Session - 03/16/17 0950    Visit Number 11   Number of Visits 21   Date for PT Re-Evaluation 04/01/17   Authorization Type 2   Authorization Time Period of 10 g code   PT Start Time 0950   PT Stop Time 1031   PT Time Calculation (min) 41 min   Equipment Utilized During Treatment Gait belt   Activity Tolerance Patient tolerated treatment well   Behavior During Therapy [facility] for tasks assessed/performed      Past Medical History:  Diagnosis Date  . Cancer [facility])    breast cancer 2006  . Depression   . Diabetes mellitus without complication ([place])   . Hypertension     Past Surgical History:  Procedure Laterality Date  . ABDOMINAL HYSTERECTOMY    . endoscopic carpal tunnel release    . HALLUX VALGUS CORRECTION Left 1983  . HERNIA REPAIR     x2  . left foot surgery Left   . SHOULDER SURGERY Bilateral   . SPINE SURGERY     back    There were no vitals filed for this visit.      Subjective Assessment - 03/16/17 0952    Subjective Pt states not taking prescription medicaiton but taking over the counter probiotics. The next two weeks are going to be busy. Getting her carotid and echocardiogram tests.  The wound in her leg is healing.  The dizziness is a little bit different. It's not as bad as they were.  Usually gets it when she gets up and down from her bed or when standing up from a bent over position.  Back is not great, might be because of things she has been doing such as shoving some furniture and picking up things that are 20 lbs.  5-6/10 back pain this morning after getting out of the sofa. Hx of back surgery L4/L5, and  L5/S1. Has rods and screws    Pertinent History DDD, imbalance. Pt states recently moving here from [place] to the [employer]. Back pain recently started again (had back surgery 2006 which helped before). Feels like her age is catching up on her. Bending forward, cooking bothers her. Pt sometimes feel like she has to use the bathroom more often but has not become enough of a problem for her. Has control of bladder or bowel function. Woke up with a headache and a slight fever this past Monday, went to [place] (was given a fall risk bracelet). Feels fine today. Pt states having problems adjusting to the humidity, got a humidifier, which helped her headaches.  Pt states having balance problems for the past 10 years. Had a fall 1 month ago due to tripping on something. Feels like if she is not careful, she falls. Has never been hurt in a fall before. Her neurologist in [place] recommended for her to use a SPC.  Pt states that she does not have osteoporosis. Had PT for balance before which helped. Feels like her balance is somewhat better due to prior PT.  Has not noticed light headedness or dizziness when she falls. Just feels like she makes a wrong move and has a hard  time stopping herself.  Pt states that her L leg bothers her (dermatology thing). 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  The leg is not healing as well as it should. Has diabetes.  Does not bother her when she walks.   Pt also adds that fatigue might play a factor with her falls.   Patient Stated Goals Be able to go back and ride her bike, roller skate, go shopping (for longer hours (more than half a day)   Currently in Pain? Yes   Pain Score 4   3-4/10 back pain currently   Pain Onset More than a month ago                                 PT Education - 03/16/17 1227    Education provided Yes   Education Details ther-ex   [employer]) Educated Patient   Methods  Explanation;Demonstration;Tactile cues;Verbal cues   Comprehension Returned demonstration;Verbalized understanding       Objectives  Pt observed to ambulate without AD. No LOB Low back pain area.    Time spent listening to pt subjective. Pt has not been to PT for the past 2 weeks.    There-ex  Directed patient with standing bilateral shoulder extension resisting red band with scapular retraction 10x5 seconds  Then with yellow band 10x5 seconds to promote thoracic extension and trunk muscle use   Sitting with proper posture: gentle manual perturbation from PT 30 seconds x 4 to promote trunk muscle use  Seated anterior/posterior pelvic tilts 10x2 each direction   Seated lateral pelvic tilts 10x2 each side    Reviewed floor to chair, and floor to stand transfer per pt question. Pt verbalized understanding  Supine hip extension isometrics, leg straight 10x5 seconds each LE to promote glute max muscle use    Improved exercise technique, movement at target joints, use of target muscles after min to mod verbal, visual, tactile cues.      Worked on gentle trunk mobility and strengthening. Dizziness with sit <> supine. Eases with rest. No nystagmus observed. Pt tolerated session without aggravation of back symptoms. Pt overall walking more steady compared to previous sessions. Pt also observed to walk into the clinic without use of AD.           PT Long Term Goals - 03/03/17 1819      PT LONG TERM GOAL #1   Title Patient will have a decrease in back pain to 3/10 or less at most to promote ability to perform standing tasks such as cooking.    Baseline 6/10 at most for the past 2 months (01/20/2017); 5/10 back pain at most (03/03/2017)   Time 4   Period Weeks   Status On-going     PT LONG TERM GOAL #2   Title Patient will improve her Modified Oswestry Low Back Pain Disability Questionnaire by at least 12% as a demonstration of improved function.    Baseline 34% (01/20/2017);  22% (03/03/2017)   Time 4   Period Weeks   Status Achieved     PT LONG TERM GOAL #3   Title Patient will improve her LEFS score by at least 9 points as a demonnstration of improved function.    Baseline 54/80 (01/20/2017); 57/80 (03/03/2017)   Time 4   Period Weeks   Status On-going     PT LONG TERM GOAL #4  Title Pt will improve bilateral LE strength by at least 1/2 MMT grade to promote more steadiness with gait, decrease fall risk, and improve ability to perform standing tasks.    Time 4   Period Weeks   Status Partially Met     PT LONG TERM GOAL #5   Title Pt will improve her 5 times sit to stand time without using hands to 11 seconds or less to promote balance.   Baseline 12 seconds with use of hands (01/20/2017); 11.45 seconds with use of hands (03/03/2017)   Time 6   Period Weeks   Status Partially Met               Plan - 03/16/17 0950    Clinical Impression Statement Worked on gentle trunk mobility and strengthening. Dizziness with sit <> supine. Eases with rest. No nystagmus observed. Pt tolerated session without aggravation of back symptoms. Pt overall walking more steady compared to previous sessions. Pt also observed to walk into the clinic without use of AD.    Rehab Potential Fair   Clinical Impairments Affecting Rehab Potential chronicity of condition   PT Frequency 2x / week   PT Duration 4 weeks   PT Treatment/Interventions Therapeutic exercise;Therapeutic activities;Manual techniques;Patient/family education;Neuromuscular re-education;Ultrasound;Gait training;Iontophoresis '4mg'$ /ml Dexamethasone;Electrical Stimulation;Aquatic Therapy   PT Next Visit Plan LE strengthenig, balance, thoracic extension, scapular strengthening, core strengthening   Consulted and Agree with Plan of Care Patient  pt states wanting to take care of her other doctor's appointments first      Patient will benefit from skilled therapeutic intervention in order to improve the following  deficits and impairments:  Pain, Postural dysfunction, Improper body mechanics, Difficulty walking, Decreased strength, Decreased balance  Visit Diagnosis: Chronic bilateral low back pain, with sciatica presence unspecified  History of falling  Muscle weakness (generalized)  Unsteadiness on feet     Problem List There are no active problems to display for this patient.   Joneen Boers PT, DPT   03/16/2017, 12:36 PM  [place] [facility] [address], [place], [address] Phone: [phone]   Fax:  [phone]  Name: Kelsey Haynes MRN: [id number] Date of Birth: June 19, 1932

## 2017-03-17 ENCOUNTER — Ambulatory Visit: Payer: Medicare Other

## 2017-03-17 DIAGNOSIS — Z9181 History of falling: Secondary | ICD-10-CM

## 2017-03-17 DIAGNOSIS — G8929 Other chronic pain: Secondary | ICD-10-CM

## 2017-03-17 DIAGNOSIS — M545 Low back pain: Secondary | ICD-10-CM | POA: Diagnosis not present

## 2017-03-17 DIAGNOSIS — R2681 Unsteadiness on feet: Secondary | ICD-10-CM

## 2017-03-17 DIAGNOSIS — M6281 Muscle weakness (generalized): Secondary | ICD-10-CM

## 2017-03-17 NOTE — Therapy (Signed)
[facility] [facility] [address], [place], [address] Phone: [phone]   Fax:  [phone]  Physical Therapy Treatment  Patient Details  Name: Kelsey Haynes MRN: [id number] Date of Birth: Feb 21, 1932 Referring Provider: Glendon Axe, MD  Encounter Date: 03/17/2017      PT End of Session - 03/17/17 0951    Visit Number 12   Number of Visits 21   Date for PT Re-Evaluation 04/01/17   Authorization Type 3   Authorization Time Period of 10 g code   PT Start Time 0951   PT Stop Time 1032   PT Time Calculation (min) 41 min   Equipment Utilized During Treatment Gait belt   Activity Tolerance Patient tolerated treatment well   Behavior During Therapy [facility] for tasks assessed/performed      Past Medical History:  Diagnosis Date  . Cancer [facility])    breast cancer 2006  . Depression   . Diabetes mellitus without complication ([place])   . Hypertension     Past Surgical History:  Procedure Laterality Date  . ABDOMINAL HYSTERECTOMY    . endoscopic carpal tunnel release    . HALLUX VALGUS CORRECTION Left 1983  . HERNIA REPAIR     x2  . left foot surgery Left   . SHOULDER SURGERY Bilateral   . SPINE SURGERY     back    There were no vitals filed for this visit.      Subjective Assessment - 03/17/17 0953    Subjective Pt states that she is getting her carotid artery and echocardiogram checked today. The dizziness is about the same today, thinks some of how she is feeling is weather related. States having fibromyalgia and has sinus symptoms which might affect how she feels. Felt good yesterday and walked more without and AD and was fine.  Has more of a tired feeling in her back.  Wants to hold off on checking for vertigo until after her cardiac tests and when she has her husband to help her if she has dizziness.    Pertinent History DDD, imbalance. Pt states recently moving here from [place] to the [employer].  Back pain recently started again (had back surgery 2006 which helped before). Feels like her age is catching up on her. Bending forward, cooking bothers her. Pt sometimes feel like she has to use the bathroom more often but has not become enough of a problem for her. Has control of bladder or bowel function. Woke up with a headache and a slight fever this past Monday, went to [place] (was given a fall risk bracelet). Feels fine today. Pt states having problems adjusting to the humidity, got a humidifier, which helped her headaches.  Pt states having balance problems for the past 10 years. Had a fall 1 month ago due to tripping on something. Feels like if she is not careful, she falls. Has never been hurt in a fall before. Her neurologist in [place] recommended for her to use a SPC.  Pt states that she does not have osteoporosis. Had PT for balance before which helped. Feels like her balance is somewhat better due to prior PT.  Has not noticed light headedness or dizziness when she falls. Just feels like she makes a wrong move and has a hard time stopping herself.  Pt states that her L leg bothers her (dermatology thing). 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  The leg  is not healing as well as it should. Has diabetes.  Does not bother her when she walks.   Pt also adds that fatigue might play a factor with her falls.   Patient Stated Goals Be able to go back and ride her bike, roller skate, go shopping (for longer hours (more than half a day)   Currently in Pain? No/denies   Pain Score --  more of a tired feeling in her back, no complain of pain   Pain Onset More than a month ago                                 PT Education - 03/17/17 1012    Education provided Yes   Education Details ther-ex, HEP   Person(s) Educated Patient   Methods Explanation;Demonstration;Tactile cues;Verbal cues;Handout   Comprehension Verbalized understanding;Returned  demonstration        Objectives   Pt observed to ambulate without AD. No LOB   There-ex  Directed patient with supine hip extension isometrics, leg straight 10x5 seconds for 2 sets each LE to promote glute max muscle use  Reviewed and given as part of her HEP. PT demonstrated and verbalized understanding.   Supine open books 10x5 seconds for 2 sets to promote thoracic extension  S/L clamshell 8x2 each LE   Dizziness with supine to sit, eases in about 15-20 seconds.    Sitting with proper posture: gentle manual perturbation from PT 30 seconds x 4 to promote trunk muscle use  Seated chin tucks to promote thoracic extension 10x5 seconds for 2 sets      Improved exercise technique, movement at target joints, use of target muscles after min to mod verbal, visual, tactile cues.     Pt continues to be able to ambulate independently without AD and no LOB. Continued working on thoracic extension, hip strengthening, trunk strengthening to help decrease back pain. No complain of back pain throughout session.           PT Long Term Goals - 03/03/17 1819      PT LONG TERM GOAL #1   Title Patient will have a decrease in back pain to 3/10 or less at most to promote ability to perform standing tasks such as cooking.    Baseline 6/10 at most for the past 2 months (01/20/2017); 5/10 back pain at most (03/03/2017)   Time 4   Period Weeks   Status On-going     PT LONG TERM GOAL #2   Title Patient will improve her Modified Oswestry Low Back Pain Disability Questionnaire by at least 12% as a demonstration of improved function.    Baseline 34% (01/20/2017); 22% (03/03/2017)   Time 4   Period Weeks   Status Achieved     PT LONG TERM GOAL #3   Title Patient will improve her LEFS score by at least 9 points as a demonnstration of improved function.    Baseline 54/80 (01/20/2017); 57/80 (03/03/2017)   Time 4   Period Weeks   Status On-going     PT LONG TERM GOAL #4   Title Pt will  improve bilateral LE strength by at least 1/2 MMT grade to promote more steadiness with gait, decrease fall risk, and improve ability to perform standing tasks.    Time 4   Period Weeks   Status Partially Met     PT LONG TERM GOAL #5   Title Pt will  improve her 5 times sit to stand time without using hands to 11 seconds or less to promote balance.   Baseline 12 seconds with use of hands (01/20/2017); 11.45 seconds with use of hands (03/03/2017)   Time 6   Period Weeks   Status Partially Met               Plan - 03/17/17 1018    Clinical Impression Statement Pt continues to be able to ambulate independently without AD and no LOB. Continued working on thoracic extension, hip strengthening, trunk strengthening to help decrease back pain. No complain of back pain throughout session.    Rehab Potential Fair   Clinical Impairments Affecting Rehab Potential chronicity of condition   PT Frequency 2x / week   PT Duration 4 weeks   PT Treatment/Interventions Therapeutic exercise;Therapeutic activities;Manual techniques;Patient/family education;Neuromuscular re-education;Ultrasound;Gait training;Iontophoresis 26m/ml Dexamethasone;Electrical Stimulation;Aquatic Therapy   PT Next Visit Plan LE strengthenig, balance, thoracic extension, scapular strengthening, core strengthening   Consulted and Agree with Plan of Care Patient  pt states wanting to take care of her other doctor's appointments first      Patient will benefit from skilled therapeutic intervention in order to improve the following deficits and impairments:  Pain, Postural dysfunction, Improper body mechanics, Difficulty walking, Decreased strength, Decreased balance  Visit Diagnosis: Chronic bilateral low back pain, with sciatica presence unspecified  History of falling  Muscle weakness (generalized)  Unsteadiness on feet     Problem List There are no active problems to display for this patient.   MJoneen BoersPT, DPT    03/17/2017, 12:18 PM  [facility] [address] [place] [address]Phone: [phone]  Fax:  [phone] Name: Kelsey BosticMRN: [id number] of Birth: 3April 14, 1933

## 2017-03-17 NOTE — Patient Instructions (Addendum)
     Lying on your back, knees bent:   Open your arms wide to feel your shoulder blade muscles squeeze behind you.   Hold for 5 seconds.    Repeat 10 times   Perform 3 sets daily.      Clam shells    No resistance band.   Please count out loud (for breaths)    Lie on side with hips and knees bent. Raise top knee up, squeezing glutes. Keep feet together.  _8_ reps per set each side, _2__ sets per day   Copyright  VHI. All rights reserved.

## 2017-03-19 ENCOUNTER — Encounter (HOSPITAL_COMMUNITY): Payer: Self-pay

## 2017-03-19 ENCOUNTER — Ambulatory Visit
Admission: RE | Admit: 2017-03-19 | Discharge: 2017-03-19 | Disposition: A | Discharge status: Home / Self Care | Payer: Medicare Other | Source: Ambulatory Visit | Attending: Internal Medicine | Admitting: Internal Medicine

## 2017-03-19 DIAGNOSIS — Z1231 Encounter for screening mammogram for malignant neoplasm of breast: Secondary | ICD-10-CM | POA: Insufficient documentation

## 2017-03-19 HISTORY — DX: Personal history of irradiation: Z92.3

## 2017-03-29 ENCOUNTER — Ambulatory Visit: Payer: Medicare Other | Admitting: *Deleted

## 2017-03-30 ENCOUNTER — Encounter: Payer: Medicare Other | Attending: Internal Medicine | Admitting: *Deleted

## 2017-03-30 ENCOUNTER — Ambulatory Visit: Payer: Medicare Other | Attending: Internal Medicine

## 2017-03-30 ENCOUNTER — Encounter: Payer: Self-pay | Admitting: *Deleted

## 2017-03-30 VITALS — BP 120/74 | Ht 59.0 in | Wt 153.4 lb

## 2017-03-30 DIAGNOSIS — Z713 Dietary counseling and surveillance: Secondary | ICD-10-CM | POA: Insufficient documentation

## 2017-03-30 DIAGNOSIS — E119 Type 2 diabetes mellitus without complications: Secondary | ICD-10-CM

## 2017-03-30 DIAGNOSIS — G8929 Other chronic pain: Secondary | ICD-10-CM | POA: Insufficient documentation

## 2017-03-30 DIAGNOSIS — R2681 Unsteadiness on feet: Secondary | ICD-10-CM | POA: Insufficient documentation

## 2017-03-30 DIAGNOSIS — M545 Low back pain: Secondary | ICD-10-CM | POA: Insufficient documentation

## 2017-03-30 DIAGNOSIS — M6281 Muscle weakness (generalized): Secondary | ICD-10-CM | POA: Insufficient documentation

## 2017-03-30 DIAGNOSIS — E1129 Type 2 diabetes mellitus with other diabetic kidney complication: Secondary | ICD-10-CM | POA: Insufficient documentation

## 2017-03-30 DIAGNOSIS — R809 Proteinuria, unspecified: Secondary | ICD-10-CM | POA: Insufficient documentation

## 2017-03-30 DIAGNOSIS — Z9181 History of falling: Secondary | ICD-10-CM | POA: Insufficient documentation

## 2017-03-30 NOTE — Progress Notes (Signed)
Diabetes Self-Management Education  Visit Type: First/Initial  Appt. Start Time: 0905 Appt. End Time: 1020  03/30/2017  Kelsey Haynes, identified by name and date of birth, is a 83 y.o. female with a diagnosis of Diabetes: Type 2.   ASSESSMENT  Blood pressure 120/74, height 4\' 11"  (1.499 m), weight 153 lb 6.4 oz (69.6 kg). Body mass index is 30.98 kg/m.      Diabetes Self-Management Education - 03/30/17 1206      Visit Information   Visit Type First/Initial     Initial Visit   Diabetes Type Type 2   Are you currently following a meal plan? No  50% of evening meals are eaten out due to move from [place] to [place].    Are you taking your medications as prescribed? Yes   Date Diagnosed 18-20 years ago     Health Coping   How would you rate your overall health? Good;Fair     Psychosocial Assessment   Patient Belief/Attitude about Diabetes Motivated to manage diabetes   Self-care barriers None   Self-management support Doctor's office;Family   Patient Concerns Nutrition/Meal planning;Monitoring;Weight Control;Glycemic Control   Special Needs None   Preferred Learning Style Auditory;Visual;Hands on   Learning Readiness Ready   How often do you need to have someone help you when you read instructions, pamphlets, or other written materials from your doctor or pharmacy? 1 - Never   What is the last grade level you completed in school? 2 BS degrees     Pre-Education Assessment   Patient understands the diabetes disease and treatment process. Needs Review   Patient understands incorporating nutritional management into lifestyle. Needs Review   Patient undertands incorporating physical activity into lifestyle. Needs Review   Patient understands using medications safely. Needs Instruction   Patient understands monitoring blood glucose, interpreting and using results Needs Review   Patient understands prevention, detection, and treatment of acute complications.  Needs Instruction   Patient understands prevention, detection, and treatment of chronic complications. Needs Review   Patient understands how to develop strategies to address psychosocial issues. Needs Instruction   Patient understands how to develop strategies to promote health/change behavior. Needs Instruction     Complications   Last HgB A1C per patient/outside source 6.5 %  01/05/17   How often do you check your blood sugar? 1-2 times/day   Fasting Blood glucose range (mg/dL) 70-129;130-179  Pt reports FBG's 115-140 mg/dL.   Have you had a dilated eye exam in the past 12 months? Yes   Have you had a dental exam in the past 12 months? Yes   Are you checking your feet? Yes   How many days per week are you checking your feet? 3     Dietary Intake   Breakfast raisin toast with butter   Lunch flat bread with pizza sauce, mozzarella cheese and pepperonis   Snack (afternoon) fruit   Dinner beef, fish 2 x week, chicken, corn, carrots, potatoes - sweet and regular, slaw, fruit   Snack (evening) candy, chips   Beverage(s) water, coffee, diet soda     Exercise   Exercise Type Light (walking / raking leaves)  Pt also goes to PT twice a week.   How many days per week to you exercise? 3   How many minutes per day do you exercise? 30   Total minutes per week of exercise 90     Patient Education   Previous Diabetes Education Yes (please comment)   Disease  state  Explored patient's options for treatment of their diabetes   Nutrition management  Role of diet in the treatment of diabetes and the relationship between the three main macronutrients and blood glucose level;Carbohydrate counting;Reviewed blood glucose goals for pre and post meals and how to evaluate the patients' food intake on their blood glucose level.   Physical activity and exercise  Role of exercise on diabetes management, blood pressure control and cardiac health.   Monitoring Purpose and frequency of SMBG.;Taught/discussed  recording of test results and interpretation of SMBG.;Identified appropriate SMBG and/or A1C goals.   Chronic complications Relationship between chronic complications and blood glucose control   Psychosocial adjustment Role of stress on diabetes;Identified and addressed patients feelings and concerns about diabetes     Individualized Goals (developed by patient)   Reducing Risk Improve blood sugars Prevent diabetes complications Lose weight Become more fit     Outcomes   Expected Outcomes Demonstrated interest in learning. Expect positive outcomes   Program Status Not Completed      Individualized Plan for Diabetes Self-Management Training:   Learning Objective:  Patient will have a greater understanding of diabetes self-management. Patient education plan is to attend individual and/or group sessions per assessed needs and concerns.   Plan:   Patient Instructions  Check blood sugars 1 x day before breakfast or 2 hrs after one meal  3-4 x week Exercise: Continue walking/physical therapy for  30  minutes  3 days a week Eat 3 meals day,  2  snacks a day Space meals 4-6 hours apart Include 1 serving of protein with breakfast Limit desserts/sweets Call if you want to return for an appointment with the dietitian or nurse.    Expected Outcomes:  Demonstrated interest in learning. Expect positive outcomes  Education material provided:  General Meal Planning Guidelines Simple Meal Plan  If problems or questions, patient to contact team via:  Kelsey Drilling, RN, CCM, CDE [phone]  Future DSME appointment: prn Patient had diabetes education in the past. She didn't want to return at this time. She was offered an additional appointment with the dietitian or the nurse.

## 2017-03-30 NOTE — Patient Instructions (Signed)
Check blood sugars 1 x day before breakfast or 2 hrs after one meal  3-4 x week  Exercise: Continue walking/physical therapy for  30  minutes  3 days a week  Eat 3 meals day,  2  snacks a day Space meals 4-6 hours apart Include 1 serving of protein with breakfast Limit desserts/sweets  Call if you want to return for an appointment with the dietitian or nurse.

## 2017-04-01 ENCOUNTER — Ambulatory Visit: Payer: Medicare Other

## 2017-04-01 DIAGNOSIS — R2681 Unsteadiness on feet: Secondary | ICD-10-CM

## 2017-04-01 DIAGNOSIS — M6281 Muscle weakness (generalized): Secondary | ICD-10-CM

## 2017-04-01 DIAGNOSIS — M545 Low back pain: Secondary | ICD-10-CM

## 2017-04-01 DIAGNOSIS — Z9181 History of falling: Secondary | ICD-10-CM

## 2017-04-01 DIAGNOSIS — G8929 Other chronic pain: Secondary | ICD-10-CM

## 2017-04-01 NOTE — Therapy (Signed)
[facility] [address] [place], [address] Phone: [phone]   Fax:  [phone]  Physical Therapy Treatment  Patient Details  Name: Kelsey Haynes MRN: [id number] Date of Birth: Jun 14, 1932 Referring Provider: Glendon Axe, MD  Encounter Date: 04/01/2017      PT End of Session - 04/01/17 0849    Visit Number 13   Number of Visits 21   Date for PT Re-Evaluation 04/29/17   Authorization Type 4   Authorization Time Period of 10 g code   PT Start Time 0849   PT Stop Time 0944   PT Time Calculation (min) 55 min   Activity Tolerance Patient tolerated treatment well   Behavior During Therapy [facility] for tasks assessed/performed      Past Medical History:  Diagnosis Date  . Cancer [facility]) 2006   breast cancer 2006  . Depression   . Diabetes mellitus without complication ([place])   . Hypertension   . Personal history of radiation therapy     Past Surgical History:  Procedure Laterality Date  . ABDOMINAL HYSTERECTOMY    . BREAST BIOPSY Right 2006   +  . BREAST CYST ASPIRATION Right   . endoscopic carpal tunnel release    . HALLUX VALGUS CORRECTION Left 1983  . HERNIA REPAIR     x2  . left foot surgery Left   . SHOULDER SURGERY Bilateral   . SPINE SURGERY     back    There were no vitals filed for this visit.      Subjective Assessment - 04/01/17 0853    Subjective Pt states that her cardiologist said that there is nothing that needs to be done at the moment, unless she gets chest pains or symptoms.  Dizziness has gone up and down.  Getting into her bed yesterday was ok. Did not get any medication for dizziness.  Not dizzy when she got out of bed this morning. Just felt a little off balance. Does not remember if she was dizzy getting in and out of bed 2 days ago.  Back was painfull when she first got up in this morning. Feels better when she moves.  Pt states she wants to try the Epley maneuver today to  see if it will help with her dizziness.    6/10 back pain at most for the past 7 days.    Pertinent History DDD, imbalance. Pt states recently moving here from [place] to the [employer]. Back pain recently started again (had back surgery 2006 which helped before). Feels like her age is catching up on her. Bending forward, cooking bothers her. Pt sometimes feel like she has to use the bathroom more often but has not become enough of a problem for her. Has control of bladder or bowel function. Woke up with a headache and a slight fever this past Monday, went to [place] (was given a fall risk bracelet). Feels fine today. Pt states having problems adjusting to the humidity, got a humidifier, which helped her headaches.  Pt states having balance problems for the past 10 years. Had a fall 1 month ago due to tripping on something. Feels like if she is not careful, she falls. Has never been hurt in a fall before. Her neurologist in [place] recommended for her to use a SPC.  Pt states that she does not have osteoporosis. Had PT for balance before which helped. Feels like her balance is somewhat better due to prior  PT.  Has not noticed light headedness or dizziness when she falls. Just feels like she makes a wrong move and has a hard time stopping herself.  Pt states that her L leg bothers her (dermatology thing). 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  The leg is not healing as well as it should. Has diabetes.  Does not bother her when she walks.   Pt also adds that fatigue might play a factor with her falls.   Patient Stated Goals Be able to go back and ride her bike, roller skate, go shopping (for longer hours (more than half a day)   Currently in Pain? Yes   Pain Score 4   back pain (supine)   Pain Onset More than a month ago                                 PT Education - 04/01/17 1050    Education provided Yes   Education Details ther-ex    [employer]) Educated Patient   Methods Explanation;Demonstration;Tactile cues;Verbal cues   Comprehension Verbalized understanding;Returned demonstration        Objectives   Pt observed to ambulate without AD. No LOB. Brings a walking stick.    There-ex   Vitals obtained, blood pressure L arm sitting, mechanically taken: 129/62 HR 86  Sit to supine: reproduced dizziness.   Supine lower trunk rotation 10x each side  Decreased back pain.  Seated VBI testing  Negative bilaterally   Dix hallpike    (+) on L side, 25 seconds of dizziness. Dizziness with supine to sit which lasted 20 seconds (-) on R side.    Canalith repositioning:    Epley maneuver 4x   Dizziness with first and 3rd positions which lasts for about 10 to 25 seconds. Nystagmus R eye observed 1x   Pt was recommended to sleep in her recliner tonight to help maintain position of crystals in semicirular canals    There-ex  Sit <> stand from regular chair with bilateral UE assist    13 seconds  Then 5x without hands. 12.6 seconds  Gait around gym without AD100 ft to the R and 100 ft to the L. No LOB   Improved exercise technique, movement at target joints, use of target muscles after min to mod verbal, visual, tactile cues.    Pt demonstrates decreased back pain with lumbar movement during today's session. Pt also able to ambulate at least 200 ft without AD and without LOB, able to perform sit <> stand from regular chair height without UE assist for 12.6 seconds. Pt main complaint currently is dizziness which makes getting into and out of her bed uncomfortable as well as back pain. The [facility] test suggests inner ear involvement to her dizziness and therefore the Epley maneuver was performed to help address. Pt still demonstrates back pain, LE weakness, dizziness, balance difficulties at times and difficulty performing functional tasks and would benefit from continued skilled physical therapy  services to address the aforementioned deficits.     Pt called back after session and stated that she made it home fine.           PT Long Term Goals - 04/01/17 1856      PT LONG TERM GOAL #1   Title Patient will have a decrease in back pain to 3/10 or less at most to promote ability to perform standing  tasks such as cooking.    Baseline 6/10 at most for the past 2 months (01/20/2017); 5/10 back pain at most (03/03/2017); 6/10 at most for the past 7 days (04/01/2017)   Time 4   Period Weeks   Status On-going     PT LONG TERM GOAL #2   Title Patient will improve her Modified Oswestry Low Back Pain Disability Questionnaire by at least 12% as a demonstration of improved function.    Baseline 34% (01/20/2017); 22% (03/03/2017)   Time 4   Period Weeks   Status Achieved     PT LONG TERM GOAL #3   Title Patient will improve her LEFS score by at least 9 points as a demonnstration of improved function.    Baseline 54/80 (01/20/2017); 57/80 (03/03/2017)   Time 4   Period Weeks   Status On-going     PT LONG TERM GOAL #4   Title Pt will improve bilateral LE strength by at least 1/2 MMT grade to promote more steadiness with gait, decrease fall risk, and improve ability to perform standing tasks.    Time 4   Period Weeks   Status Partially Met     PT LONG TERM GOAL #5   Title Pt will improve her 5 times sit to stand time without using hands to 11 seconds or less to promote balance.   Baseline 12 seconds with use of hands (01/20/2017); 11.45 seconds with use of hands (03/03/2017); 12.6 seconds without use of hands (04/01/2017)   Time 6   Period Weeks   Status Partially Met     Additional Long Term Goals   Additional Long Term Goals Yes     PT LONG TERM GOAL #6   Title Patient will report decreased dizziness with getting into and out of bed to promote patient comfort and improve balance.   Baseline Increased dizziness with getting into and out of bed (04/01/2017)   Time 4   Period Weeks    Status New               Plan - 04/01/17 0845    Clinical Impression Statement Pt demonstrates decreased back pain with lumbar movement during today's session. Pt also able to ambulate at least 200 ft without AD and without LOB, able to perform sit <> stand from regular chair height without UE assist for 12.6 seconds. Pt main complaint currently is dizziness which makes getting into and out of her bed uncomfortable as well as back pain. The [facility] test suggests inner ear involvement to her dizziness and therefore the Epley maneuver was performed to help address. Pt still demonstrates back pain, LE weakness, dizziness, balance difficulties at times and difficulty performing functional tasks and would benefit from continued skilled physical therapy services to address the aforementioned deficits.    Rehab Potential Fair   Clinical Impairments Affecting Rehab Potential chronicity of condition   PT Frequency 2x / week   PT Duration 4 weeks   PT Treatment/Interventions Therapeutic exercise;Therapeutic activities;Manual techniques;Patient/family education;Neuromuscular re-education;Ultrasound;Gait training;Iontophoresis '4mg'$ /ml Dexamethasone;Electrical Stimulation;Aquatic Therapy   PT Next Visit Plan LE strengthenig, balance, thoracic extension, scapular strengthening, core strengthening   Consulted and Agree with Plan of Care Patient  pt states wanting to take care of her other doctor's appointments first      Patient will benefit from skilled therapeutic intervention in order to improve the following deficits and impairments:  Pain, Postural dysfunction, Improper body mechanics, Difficulty walking, Decreased strength, Decreased balance  Visit Diagnosis: History  of falling - Plan: PT plan of care cert/re-cert  Chronic bilateral low back pain, with sciatica presence unspecified - Plan: PT plan of care cert/re-cert  Muscle weakness (generalized) - Plan: PT plan of care  cert/re-cert  Unsteadiness on feet - Plan: PT plan of care cert/re-cert     Problem List There are no active problems to display for this patient.  Joneen Boers PT, DPT   04/01/2017, 7:31 PM  [facility] [address] [place], [address] Phone: [phone]   Fax:  [phone]  Name: Celine Dishman MRN: [id number] Date of Birth: 05/17/1932

## 2017-04-01 NOTE — Patient Instructions (Signed)
Pt was recommended to perform supine lower trunk rotation when she wakes up in the morning to help decrease back pain. Pt demonstrated and verbalized understanding.

## 2017-04-06 ENCOUNTER — Ambulatory Visit: Payer: Medicare Other

## 2017-04-06 DIAGNOSIS — M6281 Muscle weakness (generalized): Secondary | ICD-10-CM

## 2017-04-06 DIAGNOSIS — Z9181 History of falling: Secondary | ICD-10-CM

## 2017-04-06 DIAGNOSIS — G8929 Other chronic pain: Secondary | ICD-10-CM

## 2017-04-06 DIAGNOSIS — M545 Low back pain: Principal | ICD-10-CM

## 2017-04-06 DIAGNOSIS — R2681 Unsteadiness on feet: Secondary | ICD-10-CM

## 2017-04-06 NOTE — Therapy (Signed)
[facility] [address] [place], [address] Phone: [phone]   Fax:  [phone]  Physical Therapy Treatment  Patient Details  Name: Kelsey Haynes MRN: [id number] Date of Birth: January 23, 1932 Referring Provider: Glendon Axe, MD  Encounter Date: 04/06/2017      PT End of Session - 04/06/17 1301    Visit Number 14   Number of Visits 21   Date for PT Re-Evaluation 04/29/17   Authorization Type 5   Authorization Time Period of 10 g code   PT Start Time 1301   PT Stop Time 1345   PT Time Calculation (min) 44 min   Activity Tolerance Patient tolerated treatment well   Behavior During Therapy [facility] for tasks assessed/performed      Past Medical History:  Diagnosis Date  . Cancer [facility]) 2006   breast cancer 2006  . Depression   . Diabetes mellitus without complication ([place])   . Hypertension   . Personal history of radiation therapy     Past Surgical History:  Procedure Laterality Date  . ABDOMINAL HYSTERECTOMY    . BREAST BIOPSY Right 2006   +  . BREAST CYST ASPIRATION Right   . endoscopic carpal tunnel release    . HALLUX VALGUS CORRECTION Left 1983  . HERNIA REPAIR     x2  . left foot surgery Left   . SHOULDER SURGERY Bilateral   . SPINE SURGERY     back    There were no vitals filed for this visit.      Subjective Assessment - 04/06/17 1302    Subjective Slept on her chair the first night. It was a miserable night. Has not had any dizziness since then getting into and out of bed.  Has not been doing exercises.   Not particulary having back pain currently.    Pertinent History DDD, imbalance. Pt states recently moving here from [place] to the [employer]. Back pain recently started again (had back surgery 2006 which helped before). Feels like her age is catching up on her. Bending forward, cooking bothers her. Pt sometimes feel like she has to use the bathroom more often but has not  become enough of a problem for her. Has control of bladder or bowel function. Woke up with a headache and a slight fever this past Monday, went to [place] (was given a fall risk bracelet). Feels fine today. Pt states having problems adjusting to the humidity, got a humidifier, which helped her headaches.  Pt states having balance problems for the past 10 years. Had a fall 1 month ago due to tripping on something. Feels like if she is not careful, she falls. Has never been hurt in a fall before. Her neurologist in [place] recommended for her to use a SPC.  Pt states that she does not have osteoporosis. Had PT for balance before which helped. Feels like her balance is somewhat better due to prior PT.  Has not noticed light headedness or dizziness when she falls. Just feels like she makes a wrong move and has a hard time stopping herself.  Pt states that her L leg bothers her (dermatology thing). 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  The leg is not healing as well as it should. Has diabetes.  Does not bother her when she walks.   Pt also adds that fatigue might play a factor with her falls.   Patient Stated Goals  Be able to go back and ride her bike, roller skate, go shopping (for longer hours (more than half a day)   Currently in Pain? No/denies   Pain Score 0-No pain   Pain Onset More than a month ago                                 PT Education - 04/06/17 1309    Education provided Yes   Education Details ther-ex   [employer]) Educated Patient   Methods Explanation;Demonstration;Tactile cues;Verbal cues   Comprehension Verbalized understanding;Returned demonstration        Objectives   Pt observed to ambulate without AD. No LOB.    There-ex  Supine lower trunk rotation 10x2 each side        seated pallof press straight resisting red band 10x2 with 5 second holds  Gait around gym 100 ft no AD. No LOB  Standing alternating  toe taps onto treadmill platform 10x2 each LE, no UE assist  Seated hip adduction ball squeeze with glute max squeeze 10x2 with 5 second holds  Standing tandem stance: L foot forward 30 seconds  R foot forward 30 seconds. More difficult  SLS 10 seconds each LE. Able to maintain balance on R LE for 7 seconds, and L LE for 3 seconds straight  Standing mini squats without UE assist 4x. R knee discomfort which eases with rest.   Side stepping 32 ft to the R and 32 ft to the L to promote glute med strengthening and balance  Four square step exercise 5x each direction CGA to SBA.   Tandem walking 5 ft forward and back each way 4x to promote balance   Standing low rows resisting yellow band 8x5 seconds to promote abdominal muscle use    Then 5x5 seconds      Improved exercise technique, movement at target joints, use of target muscles after min to mod verbal, visual, tactile cues.     Improved dizziness with supine <> sit transfers following the canalith repositioning technique for L ear last session. No complain of dizziness throughout sessions today.  Also demonstrates overall improved back pain compared to previous sessions. Continue promoting movement, balance, scapular, and hip strengthening as tolerated.               PT Long Term Goals - 04/01/17 1856      PT LONG TERM GOAL #1   Title Patient will have a decrease in back pain to 3/10 or less at most to promote ability to perform standing tasks such as cooking.    Baseline 6/10 at most for the past 2 months (01/20/2017); 5/10 back pain at most (03/03/2017); 6/10 at most for the past 7 days (04/01/2017)   Time 4   Period Weeks   Status On-going     PT LONG TERM GOAL #2   Title Patient will improve her Modified Oswestry Low Back Pain Disability Questionnaire by at least 12% as a demonstration of improved function.    Baseline 34% (01/20/2017); 22% (03/03/2017)   Time 4   Period Weeks   Status Achieved     PT LONG TERM  GOAL #3   Title Patient will improve her LEFS score by at least 9 points as a demonnstration of improved function.    Baseline 54/80 (01/20/2017); 57/80 (03/03/2017)   Time 4   Period Weeks   Status On-going     PT LONG  TERM GOAL #4   Title Pt will improve bilateral LE strength by at least 1/2 MMT grade to promote more steadiness with gait, decrease fall risk, and improve ability to perform standing tasks.    Time 4   Period Weeks   Status Partially Met     PT LONG TERM GOAL #5   Title Pt will improve her 5 times sit to stand time without using hands to 11 seconds or less to promote balance.   Baseline 12 seconds with use of hands (01/20/2017); 11.45 seconds with use of hands (03/03/2017); 12.6 seconds without use of hands (04/01/2017)   Time 6   Period Weeks   Status Partially Met     Additional Long Term Goals   Additional Long Term Goals Yes     PT LONG TERM GOAL #6   Title Patient will report decreased dizziness with getting into and out of bed to promote patient comfort and improve balance.   Baseline Increased dizziness with getting into and out of bed (04/01/2017)   Time 4   Period Weeks   Status New               Plan - 04/06/17 1300    Clinical Impression Statement Improved dizziness with supine <> sit transfers following the canalith repositioning technique for L ear last session. No complain of dizziness throughout sessions today.  Also demonstrates overall improved back pain compared to previous sessions. Continue promoting movement, balance, scapular, and hip strengthening as tolerated.    Rehab Potential Fair   Clinical Impairments Affecting Rehab Potential chronicity of condition   PT Frequency 2x / week   PT Duration 4 weeks   PT Treatment/Interventions Therapeutic exercise;Therapeutic activities;Manual techniques;Patient/family education;Neuromuscular re-education;Ultrasound;Gait training;Iontophoresis '4mg'$ /ml Dexamethasone;Electrical Stimulation;Aquatic Therapy    PT Next Visit Plan LE strengthenig, balance, thoracic extension, scapular strengthening, core strengthening   Consulted and Agree with Plan of Care Patient  pt states wanting to take care of her other doctor's appointments first      Patient will benefit from skilled therapeutic intervention in order to improve the following deficits and impairments:  Pain, Postural dysfunction, Improper body mechanics, Difficulty walking, Decreased strength, Decreased balance  Visit Diagnosis: Chronic bilateral low back pain, with sciatica presence unspecified  History of falling  Muscle weakness (generalized)  Unsteadiness on feet     Problem List There are no active problems to display for this patient.  Joneen Boers PT, DPT   04/06/2017, 3:42 PM  [facility] [address], [place], [address] Phone: [phone]   Fax:  [phone]  Name: Kelsey Haynes MRN: [id number] Date of Birth: 01/24/1932

## 2017-04-08 ENCOUNTER — Ambulatory Visit: Payer: Medicare Other

## 2017-04-13 ENCOUNTER — Ambulatory Visit: Payer: Medicare Other

## 2017-04-15 ENCOUNTER — Ambulatory Visit: Payer: Medicare Other

## 2017-04-15 DIAGNOSIS — M6281 Muscle weakness (generalized): Secondary | ICD-10-CM

## 2017-04-15 DIAGNOSIS — M545 Low back pain: Principal | ICD-10-CM

## 2017-04-15 DIAGNOSIS — G8929 Other chronic pain: Secondary | ICD-10-CM

## 2017-04-15 NOTE — Therapy (Signed)
[place] [facility] [address] [place], [address] Phone: [phone]   Fax:  [phone]  Physical Therapy Treatment  Patient Details  Name: Kelsey Haynes MRN: [id number] Date of Birth: Sep 13, 1932 Referring Provider: Glendon Axe, MD  Encounter Date: 04/15/2017      PT End of Session - 04/15/17 1631    Visit Number 15   Number of Visits 21   Date for PT Re-Evaluation 04/29/17   Authorization Type 6   Authorization Time Period of 10 g code   PT Start Time 2637   PT Stop Time 1722   PT Time Calculation (min) 51 min   Activity Tolerance Patient tolerated treatment well   Behavior During Therapy [facility] for tasks assessed/performed      Past Medical History:  Diagnosis Date  . Cancer [facility]) 2006   breast cancer 2006  . Depression   . Diabetes mellitus without complication ([place])   . Hypertension   . Personal history of radiation therapy     Past Surgical History:  Procedure Laterality Date  . ABDOMINAL HYSTERECTOMY    . BREAST BIOPSY Right 2006   +  . BREAST CYST ASPIRATION Right   . endoscopic carpal tunnel release    . HALLUX VALGUS CORRECTION Left 1983  . HERNIA REPAIR     x2  . left foot surgery Left   . SHOULDER SURGERY Bilateral   . SPINE SURGERY     back    There were no vitals filed for this visit.      Subjective Assessment - 04/15/17 1633    Subjective Later on Tuesday (04/06/17) last week, her L foot pain (lateral and medial) increased (shooting pain). Had to take prednisone which helped with the pain.  Better now. Still has tingling in her feet. Feels better when she takes pressure off it. Back has bothered her in the morning. Does not know if she did the lower trunk rotation HEP.  Back pain seems to be higher up than 10 years ago. Back pain seams to improve when it feels supported.  Dizziness has not bothered her at all.   Balance is better.    Pertinent History DDD, imbalance. Pt  states recently moving here from [place] to the [employer]. Back pain recently started again (had back surgery 2006 which helped before). Feels like her age is catching up on her. Bending forward, cooking bothers her. Pt sometimes feel like she has to use the bathroom more often but has not become enough of a problem for her. Has control of bladder or bowel function. Woke up with a headache and a slight fever this past Monday, went to [place] (was given a fall risk bracelet). Feels fine today. Pt states having problems adjusting to the humidity, got a humidifier, which helped her headaches.  Pt states having balance problems for the past 10 years. Had a fall 1 month ago due to tripping on something. Feels like if she is not careful, she falls. Has never been hurt in a fall before. Her neurologist in [place] recommended for her to use a SPC.  Pt states that she does not have osteoporosis. Had PT for balance before which helped. Feels like her balance is somewhat better due to prior PT.  Has not noticed light headedness or dizziness when she falls. Just feels like she makes a wrong move and has a hard time stopping herself.  Pt states that her L leg bothers her (  dermatology thing). 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  The leg is not healing as well as it should. Has diabetes.  Does not bother her when she walks.   Pt also adds that fatigue might play a factor with her falls.   Patient Stated Goals Be able to go back and ride her bike, roller skate, go shopping (for longer hours (more than half a day)   Currently in Pain? No/denies   Pain Onset More than a month ago                                 PT Education - 04/15/17 1648    Education provided Yes   Education Details ther-ex   [employer]) Educated Patient   Methods Explanation;Demonstration;Tactile cues;Verbal cues;Handout   Comprehension Returned demonstration;Verbalized understanding         Objectives   Pt observed to ambulate without AD. No LOB.    There-ex  seated pallof press straight resisting yellow band 10x5 second holds   Then red band 10x2 with 5 second holds  Seated  low rows resisting yellow band 8x5 seconds to promote abdominal muscle use               Then 5x5 seconds for 2 sets   Seated hip adduction ball squeeze with glute max squeeze 10x2 with 5 second holds  Seated manually resisted knee flexion 10x each LE  Seated glute max squeeze 10x5 seconds for 2 sets  Seated manually resisted clam shell isometrics 10x5 seconds for 2 sets   Seated manual trunk perturbation from PT with pt holding PVC rod 1 min x 3   Improved exercise technique, movement at target joints, use of target muscles after min to mod verbal, visual, tactile cues.      Non-weight bearing exercises performed today so as to not irritate L foot symptoms. Good carry over of no dizziness with getting into and out of bed. Worked on trunk strengthening to help decrease back symptoms.         PT Long Term Goals - 04/01/17 1856      PT LONG TERM GOAL #1   Title Patient will have a decrease in back pain to 3/10 or less at most to promote ability to perform standing tasks such as cooking.    Baseline 6/10 at most for the past 2 months (01/20/2017); 5/10 back pain at most (03/03/2017); 6/10 at most for the past 7 days (04/01/2017)   Time 4   Period Weeks   Status On-going     PT LONG TERM GOAL #2   Title Patient will improve her Modified Oswestry Low Back Pain Disability Questionnaire by at least 12% as a demonstration of improved function.    Baseline 34% (01/20/2017); 22% (03/03/2017)   Time 4   Period Weeks   Status Achieved     PT LONG TERM GOAL #3   Title Patient will improve her LEFS score by at least 9 points as a demonnstration of improved function.    Baseline 54/80 (01/20/2017); 57/80 (03/03/2017)   Time 4   Period Weeks   Status On-going     PT LONG TERM  GOAL #4   Title Pt will improve bilateral LE strength by at least 1/2 MMT grade to promote more steadiness with gait, decrease fall risk, and improve ability to perform standing tasks.    Time 4  Period Weeks   Status Partially Met     PT LONG TERM GOAL #5   Title Pt will improve her 5 times sit to stand time without using hands to 11 seconds or less to promote balance.   Baseline 12 seconds with use of hands (01/20/2017); 11.45 seconds with use of hands (03/03/2017); 12.6 seconds without use of hands (04/01/2017)   Time 6   Period Weeks   Status Partially Met     Additional Long Term Goals   Additional Long Term Goals Yes     PT LONG TERM GOAL #6   Title Patient will report decreased dizziness with getting into and out of bed to promote patient comfort and improve balance.   Baseline Increased dizziness with getting into and out of bed (04/01/2017)   Time 4   Period Weeks   Status New               Plan - 04/15/17 1655    Clinical Impression Statement Non-weight bearing exercises performed today so as to not irritate L foot symptoms. Good carry over of no dizziness with getting into and out of bed. Worked on trunk strengthening to help decrease back symptoms.    Rehab Potential Fair   Clinical Impairments Affecting Rehab Potential chronicity of condition   PT Frequency 2x / week   PT Duration 4 weeks   PT Treatment/Interventions Therapeutic exercise;Therapeutic activities;Manual techniques;Patient/family education;Neuromuscular re-education;Ultrasound;Gait training;Iontophoresis 31m/ml Dexamethasone;Electrical Stimulation;Aquatic Therapy   PT Next Visit Plan LE strengthenig, balance, thoracic extension, scapular strengthening, core strengthening   Consulted and Agree with Plan of Care Patient  pt states wanting to take care of her other doctor's appointments first      Patient will benefit from skilled therapeutic intervention in order to improve the following deficits and  impairments:  Pain, Postural dysfunction, Improper body mechanics, Difficulty walking, Decreased strength, Decreased balance  Visit Diagnosis: Chronic bilateral low back pain, with sciatica presence unspecified  Muscle weakness (generalized)     Problem List There are no active problems to display for this patient.   MJoneen BoersPT, DPT   04/15/2017, 5:37 PM  [facility] [address] [place] [address]Phone: [phone]  Fax:  [phone] Name: BHiliary OsortoMRN: [id number] of Birth: 3October 30, 1933

## 2017-04-20 ENCOUNTER — Ambulatory Visit: Payer: Medicare Other

## 2017-04-20 DIAGNOSIS — M545 Low back pain: Principal | ICD-10-CM

## 2017-04-20 DIAGNOSIS — G8929 Other chronic pain: Secondary | ICD-10-CM

## 2017-04-20 DIAGNOSIS — M6281 Muscle weakness (generalized): Secondary | ICD-10-CM

## 2017-04-20 NOTE — Therapy (Signed)
[place] [facility] [address] [place], [address] Phone: [phone]   Fax:  [phone]  Physical Therapy Treatment  Patient Details  Name: Kelsey Haynes MRN: [id number] Date of Birth: Dec 29, 1931 Referring Provider: Glendon Axe, MD  Encounter Date: 04/20/2017      PT End of Session - 04/20/17 1326    Visit Number 16   Number of Visits 21   Date for PT Re-Evaluation 04/29/17   Authorization Type 7   Authorization Time Period of 10 g code   PT Start Time 1329   PT Stop Time 1421   PT Time Calculation (min) 52 min   Activity Tolerance Patient tolerated treatment well   Behavior During Therapy [facility] for tasks assessed/performed      Past Medical History:  Diagnosis Date  . Cancer [facility]) 2006   breast cancer 2006  . Depression   . Diabetes mellitus without complication ([place])   . Hypertension   . Personal history of radiation therapy     Past Surgical History:  Procedure Laterality Date  . ABDOMINAL HYSTERECTOMY    . BREAST BIOPSY Right 2006   +  . BREAST CYST ASPIRATION Right   . endoscopic carpal tunnel release    . HALLUX VALGUS CORRECTION Left 1983  . HERNIA REPAIR     x2  . left foot surgery Left   . SHOULDER SURGERY Bilateral   . SPINE SURGERY     back    There were no vitals filed for this visit.      Subjective Assessment - 04/20/17 1332    Subjective Did a lot of walking yesterday taking someone (neighbor) shopping. Was on her feet back and forth. The R knee is bothering her. According to her orthopedist, her R knee is bad with arthritis. Not ready for injections in her R knee.  Leaning forward to fix the bed or reaching down to the washing machine bothers her back.  Sitting to take pressure off her back helps.  Getting out of bed this past Tuesday, pt felt like she had to hold onto something for balance.  Also felt the same feeling when she was just sitting watching TV. Took her blood  pressure and it was 433 systolic.   Took her blood sugar and it was 87 (low).  4/10 back pain at most for the past 7 days.   1/10 R knee when walking. Leaving for [place] in June for the summer. Wants to continue PT until end of May. Balance is much better. Has not been using her walking stick as much.         Pertinent History DDD, imbalance. Pt states recently moving here from [place] to the [employer]. Back pain recently started again (had back surgery 2006 which helped before). Feels like her age is catching up on her. Bending forward, cooking bothers her. Pt sometimes feel like she has to use the bathroom more often but has not become enough of a problem for her. Has control of bladder or bowel function. Woke up with a headache and a slight fever this past Monday, went to [place] (was given a fall risk bracelet). Feels fine today. Pt states having problems adjusting to the humidity, got a humidifier, which helped her headaches.  Pt states having balance problems for the past 10 years. Had a fall 1 month ago due to tripping on something. Feels like if she is not careful, she falls. Has never been  hurt in a fall before. Her neurologist in [place] recommended for her to use a SPC.  Pt states that she does not have osteoporosis. Had PT for balance before which helped. Feels like her balance is somewhat better due to prior PT.  Has not noticed light headedness or dizziness when she falls. Just feels like she makes a wrong move and has a hard time stopping herself.  Pt states that her L leg bothers her (dermatology thing). 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  The leg is not healing as well as it should. Has diabetes.  Does not bother her when she walks.   Pt also adds that fatigue might play a factor with her falls.   Patient Stated Goals Be able to go back and ride her bike, roller skate, go shopping (for longer hours (more than half a day)   Currently in Pain? Yes   Pain  Score 2   1-2/10 back pain currently (sitting)   Pain Onset More than a month ago            [facility] PT Assessment - 04/20/17 1434      Observation/Other Assessments   Lower Extremity Functional Scale  48/80 (pt however had R knee discomfort after walking a lot yesterday)                             PT Education - 04/20/17 1347    Education provided Yes   Education Details ther-ex   [employer]) Educated Patient   Methods Explanation;Demonstration;Tactile cues;Verbal cues   Comprehension Verbalized understanding;Returned demonstration        Objectives  Leaving for [place] in June for the summer. Wants to continue PT until end of May. Balance is much better.     There-ex  Seated trunk rotation 10x2 seconds each side  Blood pressure L arm sitting (taken based on pt subjective elevated systolic number report) mechanically taken: 106/55, HR 75  Seated hip adductor physioball squeeze with glute max squeeze 10x5 seconds for 2 sets  Seated  low rows resisting yellow band 10x5 seconds for 2 sets to promote abdominal muscle use   seated pallof press straight resisting red band 10x5 second holds for 2 sets   Seated physioball R knee flexion/extension 10x2 at pain free range   Four square step exercise 3x2. No LOB, CGA   Seated manual trunk perturbation from PT with pt holding PVC rod 1 min x 3   Improved exercise technique, movement at target joints, use of target muscles after min to mod verbal, visual, tactile cues.   Pt states back feels more lose after session. No pain. Continued working on gentle trunk mobility and trunk muscle strengthening to help with back pain, and standing balance with four square step exercise to help decrease fall risk. Performed primarily non-weight bearing exercises to LE to not aggravate foot or R knee discomfort.           PT Long Term Goals - 04/01/17 1856      PT LONG TERM GOAL #1   Title  Patient will have a decrease in back pain to 3/10 or less at most to promote ability to perform standing tasks such as cooking.    Baseline 6/10 at most for the past 2 months (01/20/2017); 5/10 back pain at most (03/03/2017); 6/10 at most for the past 7 days (04/01/2017)   Time 4  Period Weeks   Status On-going     PT LONG TERM GOAL #2   Title Patient will improve her Modified Oswestry Low Back Pain Disability Questionnaire by at least 12% as a demonstration of improved function.    Baseline 34% (01/20/2017); 22% (03/03/2017)   Time 4   Period Weeks   Status Achieved     PT LONG TERM GOAL #3   Title Patient will improve her LEFS score by at least 9 points as a demonnstration of improved function.    Baseline 54/80 (01/20/2017); 57/80 (03/03/2017)   Time 4   Period Weeks   Status On-going     PT LONG TERM GOAL #4   Title Pt will improve bilateral LE strength by at least 1/2 MMT grade to promote more steadiness with gait, decrease fall risk, and improve ability to perform standing tasks.    Time 4   Period Weeks   Status Partially Met     PT LONG TERM GOAL #5   Title Pt will improve her 5 times sit to stand time without using hands to 11 seconds or less to promote balance.   Baseline 12 seconds with use of hands (01/20/2017); 11.45 seconds with use of hands (03/03/2017); 12.6 seconds without use of hands (04/01/2017)   Time 6   Period Weeks   Status Partially Met     Additional Long Term Goals   Additional Long Term Goals Yes     PT LONG TERM GOAL #6   Title Patient will report decreased dizziness with getting into and out of bed to promote patient comfort and improve balance.   Baseline Increased dizziness with getting into and out of bed (04/01/2017)   Time 4   Period Weeks   Status New               Plan - 04/20/17 1324    Clinical Impression Statement Pt states back feels more lose after session. No pain. Continued working on gentle trunk mobility and trunk muscle strengthening  to help with back pain, and standing balance with four square step exercise to help decrease fall risk. Performed primarily non-weight bearing exercises to LE to not aggravate foot or R knee discomfort.   Rehab Potential Fair   Clinical Impairments Affecting Rehab Potential chronicity of condition   PT Frequency 2x / week   PT Duration 4 weeks   PT Treatment/Interventions Therapeutic exercise;Therapeutic activities;Manual techniques;Patient/family education;Neuromuscular re-education;Ultrasound;Gait training;Iontophoresis 59m/ml Dexamethasone;Electrical Stimulation;Aquatic Therapy   PT Next Visit Plan LE strengthenig, balance, thoracic extension, scapular strengthening, core strengthening   Consulted and Agree with Plan of Care Patient  pt states wanting to take care of her other doctor's appointments first      Patient will benefit from skilled therapeutic intervention in order to improve the following deficits and impairments:  Pain, Postural dysfunction, Improper body mechanics, Difficulty walking, Decreased strength, Decreased balance  Visit Diagnosis: Chronic bilateral low back pain, with sciatica presence unspecified  Muscle weakness (generalized)     Problem List There are no active problems to display for this patient.  MJoneen BoersPT, DPT   04/20/2017, 2:39 PM  [facility] [address] [place] [address]Phone: [phone]  Fax:  [phone] Name: Kelsey KreftMRN: [id number] of Birth: 3Mar 29, 1933

## 2017-04-21 ENCOUNTER — Ambulatory Visit: Payer: Medicare Other

## 2017-04-21 DIAGNOSIS — M6281 Muscle weakness (generalized): Secondary | ICD-10-CM

## 2017-04-21 DIAGNOSIS — M545 Low back pain: Secondary | ICD-10-CM | POA: Diagnosis not present

## 2017-04-21 DIAGNOSIS — R2681 Unsteadiness on feet: Secondary | ICD-10-CM

## 2017-04-21 DIAGNOSIS — G8929 Other chronic pain: Secondary | ICD-10-CM

## 2017-04-21 DIAGNOSIS — Z9181 History of falling: Secondary | ICD-10-CM

## 2017-04-21 NOTE — Therapy (Signed)
[place] [facility] [address], [place], [address] Phone: [phone]   Fax:  [phone]  Physical Therapy Treatment  Patient Details  Name: Kelsey Haynes MRN: [id number] Date of Birth: Jul 05, 1932 Referring Provider: Glendon Axe, MD  Encounter Date: 04/21/2017      PT End of Session - 04/21/17 1553    Visit Number 17   Number of Visits 21   Date for PT Re-Evaluation 04/29/17   Authorization Type 8   Authorization Time Period of 10 g code   PT Start Time 7353   PT Stop Time 1637   PT Time Calculation (min) 43 min   Activity Tolerance Patient tolerated treatment well   Behavior During Therapy [facility] for tasks assessed/performed      Past Medical History:  Diagnosis Date  . Cancer [facility]) 2006   breast cancer 2006  . Depression   . Diabetes mellitus without complication ([place])   . Hypertension   . Personal history of radiation therapy     Past Surgical History:  Procedure Laterality Date  . ABDOMINAL HYSTERECTOMY    . BREAST BIOPSY Right 2006   +  . BREAST CYST ASPIRATION Right   . endoscopic carpal tunnel release    . HALLUX VALGUS CORRECTION Left 1983  . HERNIA REPAIR     x2  . left foot surgery Left   . SHOULDER SURGERY Bilateral   . SPINE SURGERY     back    There were no vitals filed for this visit.      Subjective Assessment - 04/21/17 1555    Subjective Pt states having back and foot pain today. Thinking of going to arthritis meetings. 3/10 low back pain currently. Did a lot of laundry and did a lot of bending over. Also feels burning the bottom of her feet bilaterally.     Pertinent History DDD, imbalance. Pt states recently moving here from [place] to the [employer]. Back pain recently started again (had back surgery 2006 which helped before). Feels like her age is catching up on her. Bending forward, cooking bothers her. Pt sometimes feel like she has to use the bathroom more  often but has not become enough of a problem for her. Has control of bladder or bowel function. Woke up with a headache and a slight fever this past Monday, went to [place] (was given a fall risk bracelet). Feels fine today. Pt states having problems adjusting to the humidity, got a humidifier, which helped her headaches.  Pt states having balance problems for the past 10 years. Had a fall 1 month ago due to tripping on something. Feels like if she is not careful, she falls. Has never been hurt in a fall before. Her neurologist in [place] recommended for her to use a SPC.  Pt states that she does not have osteoporosis. Had PT for balance before which helped. Feels like her balance is somewhat better due to prior PT.  Has not noticed light headedness or dizziness when she falls. Just feels like she makes a wrong move and has a hard time stopping herself.  Pt states that her L leg bothers her (dermatology thing). 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  The leg is not healing as well as it should. Has diabetes.  Does not bother her when she walks.   Pt also adds that fatigue might play a factor with her falls.   Patient  Stated Goals Be able to go back and ride her bike, roller skate, go shopping (for longer hours (more than half a day)   Currently in Pain? Yes   Pain Score 3   back   Pain Onset More than a month ago                            Objectives   There-ex  seated pallof press straight resisting red band 10x5 second holds for 2 sets   Seated manual trunk perturbation from PT with pt holding PVC rod 1 min x 3  Seated trunk rotation 10x2 seconds each side for 2 sets   Seated bilateral scapular retraction resisting red band 10x2  Seated manually resisted clam shells, hips less than 90 degrees flexion 10x3 with 5 second holds.  Pt able to perform ergonomic lifting based on demonstration  Seated hip adductor physioball squeeze 10x5  seconds   Seated bilateral ankle DF/PF 10x3   Seated low rows resisting yellow band 10x5 seconds for 2 sets to promote abdominal muscle use    Improved exercise technique, movement at target joints, use of target muscles after min to mod verbal, visual, tactile cues.      Tiredness feeling at low back at end of session, no complain of pain. Decreased R foot burning sensation. Continues to be able to ambualte independently without LOB.             PT Education - 04/21/17 1606    Education provided Yes   Education Details ther-ex   [employer]) Educated Patient   Methods Explanation;Demonstration;Tactile cues;Verbal cues   Comprehension Verbalized understanding;Returned demonstration             PT Long Term Goals - 04/01/17 1856      PT LONG TERM GOAL #1   Title Patient will have a decrease in back pain to 3/10 or less at most to promote ability to perform standing tasks such as cooking.    Baseline 6/10 at most for the past 2 months (01/20/2017); 5/10 back pain at most (03/03/2017); 6/10 at most for the past 7 days (04/01/2017)   Time 4   Period Weeks   Status On-going     PT LONG TERM GOAL #2   Title Patient will improve her Modified Oswestry Low Back Pain Disability Questionnaire by at least 12% as a demonstration of improved function.    Baseline 34% (01/20/2017); 22% (03/03/2017)   Time 4   Period Weeks   Status Achieved     PT LONG TERM GOAL #3   Title Patient will improve her LEFS score by at least 9 points as a demonnstration of improved function.    Baseline 54/80 (01/20/2017); 57/80 (03/03/2017)   Time 4   Period Weeks   Status On-going     PT LONG TERM GOAL #4   Title Pt will improve bilateral LE strength by at least 1/2 MMT grade to promote more steadiness with gait, decrease fall risk, and improve ability to perform standing tasks.    Time 4   Period Weeks   Status Partially Met     PT LONG TERM GOAL #5   Title Pt will improve her 5 times sit to stand  time without using hands to 11 seconds or less to promote balance.   Baseline 12 seconds with use of hands (01/20/2017); 11.45 seconds with use of hands (03/03/2017); 12.6 seconds without use of hands (04/01/2017)  Time 6   Period Weeks   Status Partially Met     Additional Long Term Goals   Additional Long Term Goals Yes     PT LONG TERM GOAL #6   Title Patient will report decreased dizziness with getting into and out of bed to promote patient comfort and improve balance.   Baseline Increased dizziness with getting into and out of bed (04/01/2017)   Time 4   Period Weeks   Status New               Plan - 04/21/17 1607    Clinical Impression Statement Tiredness feeling at low back at end of session, no complain of pain. Decreased R foot burning sensation. Continues to be able to ambualte independently without LOB.    Rehab Potential Fair   Clinical Impairments Affecting Rehab Potential chronicity of condition   PT Frequency 2x / week   PT Duration 4 weeks   PT Treatment/Interventions Therapeutic exercise;Therapeutic activities;Manual techniques;Patient/family education;Neuromuscular re-education;Ultrasound;Gait training;Iontophoresis '4mg'$ /ml Dexamethasone;Electrical Stimulation;Aquatic Therapy   PT Next Visit Plan LE strengthenig, balance, thoracic extension, scapular strengthening, core strengthening   Consulted and Agree with Plan of Care Patient  pt states wanting to take care of her other doctor's appointments first      Patient will benefit from skilled therapeutic intervention in order to improve the following deficits and impairments:  Pain, Postural dysfunction, Improper body mechanics, Difficulty walking, Decreased strength, Decreased balance  Visit Diagnosis: Chronic bilateral low back pain, with sciatica presence unspecified  Muscle weakness (generalized)  History of falling  Unsteadiness on feet     Problem List There are no active problems to display for  this patient.  Joneen Boers PT, DPT   04/22/2017, 10:34 AM  [facility] [address], [place], [address] Phone: [phone]   Fax:  [phone]  Name: Kelsey Haynes MRN: [id number] Date of Birth: Jul 10, 1932

## 2017-04-22 ENCOUNTER — Ambulatory Visit: Payer: Medicare Other

## 2017-04-26 ENCOUNTER — Ambulatory Visit: Payer: Medicare Other

## 2017-04-26 DIAGNOSIS — G8929 Other chronic pain: Secondary | ICD-10-CM

## 2017-04-26 DIAGNOSIS — Z9181 History of falling: Secondary | ICD-10-CM

## 2017-04-26 DIAGNOSIS — M545 Low back pain: Secondary | ICD-10-CM | POA: Diagnosis not present

## 2017-04-26 DIAGNOSIS — M6281 Muscle weakness (generalized): Secondary | ICD-10-CM

## 2017-04-26 DIAGNOSIS — R2681 Unsteadiness on feet: Secondary | ICD-10-CM

## 2017-04-26 NOTE — Therapy (Signed)
[facility] [address] [place], [address] Phone: [phone]   Fax:  [phone]  Physical Therapy Treatment And Progress Report  Patient Details  Name: Kelsey Haynes MRN: [id number] Date of Birth: 12-21-1932 Referring Provider: Glendon Axe, MD  Encounter Date: 04/26/2017      PT End of Session - 04/26/17 1257    Visit Number 18   Number of Visits 21   Date for PT Re-Evaluation 05/27/17   Authorization Type 1   Authorization Time Period of 10 g code   PT Start Time 1300   PT Stop Time 1344   PT Time Calculation (min) 44 min   Activity Tolerance Patient tolerated treatment well   Behavior During Therapy [facility] for tasks assessed/performed      Past Medical History:  Diagnosis Date  . Cancer [facility]) 2006   breast cancer 2006  . Depression   . Diabetes mellitus without complication ([place])   . Hypertension   . Personal history of radiation therapy     Past Surgical History:  Procedure Laterality Date  . ABDOMINAL HYSTERECTOMY    . BREAST BIOPSY Right 2006   +  . BREAST CYST ASPIRATION Right   . endoscopic carpal tunnel release    . HALLUX VALGUS CORRECTION Left 1983  . HERNIA REPAIR     x2  . left foot surgery Left   . SHOULDER SURGERY Bilateral   . SPINE SURGERY     back    There were no vitals filed for this visit.      Subjective Assessment - 04/26/17 1301    Subjective Got up with pain in her back. Tried to vacuum around 10 am this morning which bothered her back (5-6/10 back pain at that time).  Minimal to no back pain currently (pt sitting). Dizziness has not been good. Not as bad as it was but enough to know that it is there. Does not notice dizziness as much getting up from lying down (feels less sturdy). Getting into her bed, she feels dizziness and the room spinnig but not as bad as it used to be.  Wants to try the epley maneuver when she gets a recliner to sleep on next time we  try the epley maneuver.  6-7/10 back pain at most for the past 7 days for a couple of minutes (feels better when pt takes a break from when she was doing).    Pertinent History DDD, imbalance. Pt states recently moving here from [place] to the [employer]. Back pain recently started again (had back surgery 2006 which helped before). Feels like her age is catching up on her. Bending forward, cooking bothers her. Pt sometimes feel like she has to use the bathroom more often but has not become enough of a problem for her. Has control of bladder or bowel function. Woke up with a headache and a slight fever this past Monday, went to [place] (was given a fall risk bracelet). Feels fine today. Pt states having problems adjusting to the humidity, got a humidifier, which helped her headaches.  Pt states having balance problems for the past 10 years. Had a fall 1 month ago due to tripping on something. Feels like if she is not careful, she falls. Has never been hurt in a fall before. Her neurologist in [place] recommended for her to use a SPC.  Pt states that she does not have osteoporosis. Had PT for balance before which helped.  Feels like her balance is somewhat better due to prior PT.  Has not noticed light headedness or dizziness when she falls. Just feels like she makes a wrong move and has a hard time stopping herself.  Pt states that her L leg bothers her (dermatology thing). 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  The leg is not healing as well as it should. Has diabetes.  Does not bother her when she walks.   Pt also adds that fatigue might play a factor with her falls.   Patient Stated Goals Be able to go back and ride her bike, roller skate, go shopping (for longer hours (more than half a day)   Currently in Pain? No/denies   Pain Onset More than a month ago            [facility] PT Assessment - 04/26/17 0001      Observation/Other Assessments   Observations 11.52  seconds 5 times sit to stand without use of UE   Lower Extremity Functional Scale  48/80 (pt however had R knee discomfort after walking a lot the day before filling out the questionnaire)  Measured on 04/20/2017     Strength   Right Hip Flexion 4+/5   Right Hip ABduction 4-/5   Left Hip Flexion 4+/5   Left Hip ABduction 4/5   Right Knee Flexion 5/5   Right Knee Extension 5/5   Left Knee Flexion 5/5   Left Knee Extension 5/5                             PT Education - 04/26/17 1308    Education provided Yes   Education Details ther-ex, HEP   Person(s) Educated Patient   Methods Explanation;Demonstration;Tactile cues;Verbal cues;Handout   Comprehension Returned demonstration;Verbalized understanding        Objectives  Feels burning under R foot currently. Feels like she is better on her feet compared to before starting PT. Pt states she does not normally vacuum, her husband does.     There-ex  Sit <> stand 5x2 without UE assist.   9.46 seconds at first round but pt did not stand up completely. 11.52 seconds at second round when pt stood up completely.   Seated manually resisted hip flexion, knee flexion, knee extension, S/L hip abduction 1-2x each way for each LE.   Sit to supine onto L side: dizziness with up beating R eye  Subsided with rest.  S/L hip abduction 5x2 each side.   Reviewed and given as part of HEP. Pt demonstrated and verbalized understanding.   Standing forward weight shifting for vacuuming to protect back when performing chores 10x each LE   Seated manually resisted bilateral shoulder extension isometrics with pt holding PVC rod 10x 5 seconds for 2 sets to promote trunk muscle use  Seated manually resisted clam shells, hips less than 90 degrees flexion 10x2 with 5 second holds.    Improved exercise technique, movement at target joints, use of target muscles after min to mod verbal, visual, tactile cues.      Pt  demonstrates overall improved bilateral LE strength,5 times sit to stand times without use of hands, and overall decreased dizziness with sit <> supine transfers since initial evaluation. Vertigo with transfers improved after performing the Epley maneuver a few weeks ago but symptoms are slowly returning. Pt states wanting to try the canalith repositioning maneuver again when she is  able to get a recliner again to sleep on the evening of trying the maneuver again to help keep the crystals in place. Back pain levels vary with worst pain levels ranging from 4/10 (04/20/2017) to 6-7/10 (04/26/2017) at most for the past 7 days. Movement and use of trunk muscles seem to help. Pt overall balance improving based on her 5 times sit to stand, pt reports, and pt able to ambulate independently without use of AD safely. Pt still demonstrates back pain, LE weakness, and dizziness with sit <> supine transfers and would benefit from continued skilled physical therapy services to address the aforementioned deficits.                  PT Long Term Goals - 04/26/17 1408      PT LONG TERM GOAL #1   Title Patient will have a decrease in back pain to 3/10 or less at most to promote ability to perform standing tasks such as cooking.    Baseline 6/10 at most for the past 2 months (01/20/2017); 5/10 back pain at most (03/03/2017); 6/10 at most for the past 7 days (04/01/2017); 6-7/10 at most for the past 7 days (04/26/2017)   Time 4   Period Weeks   Status On-going     PT LONG TERM GOAL #2   Title Patient will improve her Modified Oswestry Low Back Pain Disability Questionnaire by at least 12% as a demonstration of improved function.    Baseline 34% (01/20/2017); 22% (03/03/2017)   Time 4   Period Weeks   Status Achieved     PT LONG TERM GOAL #3   Title Patient will improve her LEFS score by at least 9 points as a demonnstration of improved function.    Baseline 54/80 (01/20/2017); 57/80 (03/03/2017); 48/80 (pt however had  R knee discomfort after walking a lit the day before filling out the questionnaire) 04/20/2017.   Time 4   Period Weeks   Status On-going     PT LONG TERM GOAL #4   Title Pt will improve bilateral LE strength by at least 1/2 MMT grade to promote more steadiness with gait, decrease fall risk, and improve ability to perform standing tasks.    Time 4   Period Weeks   Status Partially Met     PT LONG TERM GOAL #5   Title Pt will improve her 5 times sit to stand time without using hands to 11 seconds or less to promote balance.   Baseline 12 seconds with use of hands (01/20/2017); 11.45 seconds with use of hands (03/03/2017); 12.6 seconds without use of hands (04/01/2017); 11.52 seconds wihtout use of hands (04/26/2017)   Time 4   Period Weeks   Status Partially Met     PT LONG TERM GOAL #6   Title Patient will report decreased dizziness with getting into and out of bed to promote patient comfort and improve balance.   Baseline Increased dizziness with getting into and out of bed (04/01/2017); Less overall dizziness with getting into and out of bed (04/26/2017)   Time 4   Period Weeks   Status On-going               Plan - 04/26/17 1256    Clinical Impression Statement Pt demonstrates overall improved bilateral LE strength,5 times sit to stand times without use of hands, and overall decreased dizziness with sit <> supine transfers since initial evaluation. Vertigo with transfers improved after performing the Epley maneuver a few  weeks ago but symptoms are slowly returning. Pt states wanting to try the canalith repositioning maneuver again when she is able to get a recliner again to sleep on the evening of trying the maneuver again to help keep the crystals in place. Back pain levels vary with worst pain levels ranging from 4/10 (04/20/2017) to 6-7/10 (09-May-2017) at most for the past 7 days. Movement and use of trunk muscles seem to help. Pt overall balance improving based on her 5 times sit to  stand, pt reports, and pt able to ambulate independently without use of AD safely. Pt still demonstrates back pain, LE weakness, and dizziness with sit <> supine transfers and would benefit from continued skilled physical therapy services to address the aforementioned deficits.    Rehab Potential Fair   Clinical Impairments Affecting Rehab Potential chronicity of condition   PT Frequency 2x / week   PT Duration 4 weeks   PT Treatment/Interventions Therapeutic exercise;Therapeutic activities;Manual techniques;Patient/family education;Neuromuscular re-education;Ultrasound;Gait training;Iontophoresis '4mg'$ /ml Dexamethasone;Electrical Stimulation;Aquatic Therapy   PT Next Visit Plan LE strengthenig, balance, thoracic extension, scapular strengthening, core strengthening   Consulted and Agree with Plan of Care Patient  pt states wanting to take care of her other doctor's appointments first      Patient will benefit from skilled therapeutic intervention in order to improve the following deficits and impairments:  Pain, Postural dysfunction, Improper body mechanics, Difficulty walking, Decreased strength, Decreased balance  Visit Diagnosis: Chronic bilateral low back pain, with sciatica presence unspecified - Plan: PT plan of care cert/re-cert  Muscle weakness (generalized) - Plan: PT plan of care cert/re-cert  History of falling - Plan: PT plan of care cert/re-cert  Unsteadiness on feet - Plan: PT plan of care cert/re-cert       G-Codes - 05/09/17 1408    Functional Assessment Tool Used (Outpatient Only) Modified Oswestry Low Back Pain Disability Questionnaire, LEFS, clinical presentation, patient interview   Functional Limitation Mobility: Walking and moving around   Mobility: Walking and Moving Around Current Status (E4975) At least 20 percent but less than 40 percent impaired, limited or restricted   Mobility: Walking and Moving Around Goal Status (P0051) At least 1 percent but less than 20  percent impaired, limited or restricted      Problem List There are no active problems to display for this patient.  Joneen Boers PT, DPT   05-09-2017, 2:22 PM  [facility] [address], [place], [address] Phone: [phone]   Fax:  [phone]  Name: Kelsey Haynes MRN: [id number] Date of Birth: [birth year]

## 2017-04-26 NOTE — Patient Instructions (Signed)
  Abduction: Side Leg Lift (Eccentric) - Side-Lying    Lie on side. Lift top leg slightly higher than shoulder level. Keep top leg straight with body, toes pointing forward. Keep your pelvis perpendicular to your bed and keep your top thigh from moving forward. __5_ reps per set, __2_ sets every other day.  http://ecce.exer.us/62   Copyright  VHI. All rights reserved.

## 2017-04-29 ENCOUNTER — Ambulatory Visit: Payer: Medicare Other

## 2017-05-05 ENCOUNTER — Ambulatory Visit: Payer: Medicare Other | Attending: Internal Medicine

## 2017-05-05 DIAGNOSIS — M6281 Muscle weakness (generalized): Secondary | ICD-10-CM | POA: Diagnosis present

## 2017-05-05 DIAGNOSIS — Z9181 History of falling: Secondary | ICD-10-CM

## 2017-05-05 DIAGNOSIS — G8929 Other chronic pain: Secondary | ICD-10-CM | POA: Diagnosis present

## 2017-05-05 DIAGNOSIS — M545 Low back pain: Secondary | ICD-10-CM | POA: Diagnosis present

## 2017-05-05 DIAGNOSIS — R2681 Unsteadiness on feet: Secondary | ICD-10-CM | POA: Diagnosis present

## 2017-05-05 NOTE — Therapy (Signed)
[facility] [address], [place], [address] Phone: [phone]   Fax:  [phone]  Physical Therapy Treatment  Patient Details  Name: Kelsey Haynes MRN: [id number] Date of Birth: July 10, 1932 Referring Provider: Glendon Axe, MD  Encounter Date: 05/05/2017      PT End of Session - 05/05/17 1531    Visit Number 19   Number of Visits 21   Date for PT Re-Evaluation 05/27/17   Authorization Type 2   Authorization Time Period of 10 g code   PT Start Time 8115   PT Stop Time 1618   PT Time Calculation (min) 47 min   Activity Tolerance Patient tolerated treatment well   Behavior During Therapy [facility] for tasks assessed/performed      Past Medical History:  Diagnosis Date  . Cancer [facility]) 2006   breast cancer 2006  . Depression   . Diabetes mellitus without complication ([place])   . Hypertension   . Personal history of radiation therapy     Past Surgical History:  Procedure Laterality Date  . ABDOMINAL HYSTERECTOMY    . BREAST BIOPSY Right 2006   +  . BREAST CYST ASPIRATION Right   . endoscopic carpal tunnel release    . HALLUX VALGUS CORRECTION Left 1983  . HERNIA REPAIR     x2  . left foot surgery Left   . SHOULDER SURGERY Bilateral   . SPINE SURGERY     back    There were no vitals filed for this visit.      Subjective Assessment - 05/05/17 1532    Subjective Things were fine Monday but felt a little "barfy" Wednesday morning. Feels like the dizziness is coming back. Threw up Wednesday afternoon. Pt states losing control of her bladder Wendesday night, slipped on her urine, and fell. Also had diarrhea. Took first 4 prednisone medication.  Also took a medication for dairrhea which took care of it.  The prednisone helped with her foot pain.  Felt pretty good Friday. Her husband also felt "barfy" too.   Feels like the barfy feeling is connected to the bad feeling of getting into and out of bed.  Has  not gotten a recliner but is able to set up her sofa to she can safely sleep on it.  Did not break anything when she fell.  Pt states that she got wedged between the toilet and the cabinet, landed on her rear end. Does not think she hit her head. No neck pain greater than usual.  Pt thinks that Dr Candiss Norse knows about her urinary problem.  Feels like it is becoming more of a problem.  Denies saddle anesthesia. Pt states that her urinary problems is due to age and having multiple babies.    Pertinent History DDD, imbalance. Pt states recently moving here from [place] to the [employer]. Back pain recently started again (had back surgery 2006 which helped before). Feels like her age is catching up on her. Bending forward, cooking bothers her. Pt sometimes feel like she has to use the bathroom more often but has not become enough of a problem for her. Has control of bladder or bowel function. Woke up with a headache and a slight fever this past Monday, went to [place] (was given a fall risk bracelet). Feels fine today. Pt states having problems adjusting to the humidity, got a humidifier, which helped her headaches.  Pt states having balance problems for the past 10 years.  Had a fall 1 month ago due to tripping on something. Feels like if she is not careful, she falls. Has never been hurt in a fall before. Her neurologist in [place] recommended for her to use a SPC.  Pt states that she does not have osteoporosis. Had PT for balance before which helped. Feels like her balance is somewhat better due to prior PT.  Has not noticed light headedness or dizziness when she falls. Just feels like she makes a wrong move and has a hard time stopping herself.  Pt states that her L leg bothers her (dermatology thing). 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  The leg is not healing as well as it should. Has diabetes.  Does not bother her when she walks.   Pt also adds that fatigue might play a factor  with her falls.   Patient Stated Goals Be able to go back and ride her bike, roller skate, go shopping (for longer hours (more than half a day)   Currently in Pain? Yes   Pain Score 4   back pain   Pain Onset More than a month ago                                 PT Education - 05/05/17 1722    Education provided Yes   Education Details ther-ex   [employer]) Educated Patient   Methods Explanation;Demonstration;Tactile cues;Verbal cues   Comprehension Returned demonstration;Verbalized understanding        Objectives  Increased time taken to listen to pt subjective reports   There-ex  Cervical AROM [place]: [facility], no pain/discomfort more than usual per pt.  Pt states that she did not hurt her neck. Wants to try the canalith repositioning technique.        Canalith repositioning:               Epley maneuver 4x                         Dizziness with first repetition during the 2nd position. None during the rest of the repetitions              Pt was recommended to sleep in her couch upright safely tonight to help maintain position of crystals in semicirular canals   There-ex continued  seated pallof press straight resisting red band 10x5 second holds for 1 sets    Seated manual trunk perturbation from PT with pt holding PVC rod 1 min x 3 to promote trunk muscle use   Improved exercise technique, movement at target joints, use of target muscles after min to mod verbal, visual, tactile cues.    Performed Canalith repositioning to help address dizziness. No red flags for neck observed. Dizinees during the first repetition. No dizziness during the rest. Continued working on gentle trunk strengthening to help with back symptoms.         PT Long Term Goals - 04/26/17 1408      PT LONG TERM GOAL #1   Title Patient will have a decrease in back pain to 3/10 or less at most to promote ability to perform standing tasks such as cooking.     Baseline 6/10 at most for the past 2 months (01/20/2017); 5/10 back pain at most (03/03/2017); 6/10 at most for the past 7 days (04/01/2017); 6-7/10 at most for  the past 7 days (04/26/2017)   Time 4   Period Weeks   Status On-going     PT LONG TERM GOAL #2   Title Patient will improve her Modified Oswestry Low Back Pain Disability Questionnaire by at least 12% as a demonstration of improved function.    Baseline 34% (01/20/2017); 22% (03/03/2017)   Time 4   Period Weeks   Status Achieved     PT LONG TERM GOAL #3   Title Patient will improve her LEFS score by at least 9 points as a demonnstration of improved function.    Baseline 54/80 (01/20/2017); 57/80 (03/03/2017); 48/80 (pt however had R knee discomfort after walking a lit the day before filling out the questionnaire) 04/20/2017.   Time 4   Period Weeks   Status On-going     PT LONG TERM GOAL #4   Title Pt will improve bilateral LE strength by at least 1/2 MMT grade to promote more steadiness with gait, decrease fall risk, and improve ability to perform standing tasks.    Time 4   Period Weeks   Status Partially Met     PT LONG TERM GOAL #5   Title Pt will improve her 5 times sit to stand time without using hands to 11 seconds or less to promote balance.   Baseline 12 seconds with use of hands (01/20/2017); 11.45 seconds with use of hands (03/03/2017); 12.6 seconds without use of hands (04/01/2017); 11.52 seconds wihtout use of hands (04/26/2017)   Time 4   Period Weeks   Status Partially Met     PT LONG TERM GOAL #6   Title Patient will report decreased dizziness with getting into and out of bed to promote patient comfort and improve balance.   Baseline Increased dizziness with getting into and out of bed (04/01/2017); Less overall dizziness with getting into and out of bed (04/26/2017)   Time 4   Period Weeks   Status On-going               Plan - 05/05/17 1618    Clinical Impression Statement Performed Canalith repositioning to help  address dizziness. No red flags for neck observed. Dizinees during the first repetition. No dizziness during the rest. Continued working on gentle trunk strengthening to help with back symptoms.    Rehab Potential Fair   Clinical Impairments Affecting Rehab Potential chronicity of condition   PT Frequency 2x / week   PT Duration 4 weeks   PT Treatment/Interventions Therapeutic exercise;Therapeutic activities;Manual techniques;Patient/family education;Neuromuscular re-education;Ultrasound;Gait training;Iontophoresis 29m/ml Dexamethasone;Electrical Stimulation;Aquatic Therapy   PT Next Visit Plan LE strengthenig, balance, thoracic extension, scapular strengthening, core strengthening   Consulted and Agree with Plan of Care Patient  pt states wanting to take care of her other doctor's appointments first      Patient will benefit from skilled therapeutic intervention in order to improve the following deficits and impairments:  Pain, Postural dysfunction, Improper body mechanics, Difficulty walking, Decreased strength, Decreased balance  Visit Diagnosis: Chronic bilateral low back pain, with sciatica presence unspecified  Muscle weakness (generalized)  History of falling  Unsteadiness on feet     Problem List There are no active problems to display for this patient.   MJoneen BoersPT, DPT   05/05/2017, 5:30 PM  [facility] [address] [place] [address]Phone: [phone]  Fax:  [phone] Name: BKiva NorlandMRN: [id number] of Birth: 3Jan 04, 1933

## 2017-05-12 ENCOUNTER — Ambulatory Visit: Payer: Medicare Other

## 2017-05-12 DIAGNOSIS — G8929 Other chronic pain: Secondary | ICD-10-CM

## 2017-05-12 DIAGNOSIS — R2681 Unsteadiness on feet: Secondary | ICD-10-CM

## 2017-05-12 DIAGNOSIS — M545 Low back pain: Principal | ICD-10-CM

## 2017-05-12 DIAGNOSIS — Z9181 History of falling: Secondary | ICD-10-CM

## 2017-05-12 DIAGNOSIS — M6281 Muscle weakness (generalized): Secondary | ICD-10-CM

## 2017-05-12 NOTE — Therapy (Signed)
[facility] [address] [place], [address] Phone: [phone]   Fax:  [phone]  Physical Therapy Treatment  Patient Details  Name: Kelsey Haynes MRN: [id number] Date of Birth: Mar 31, 1932 Referring Provider: Glendon Axe, MD  Encounter Date: 05/12/2017      PT End of Session - 05/12/17 1434    Visit Number 29   Number of Visits 21   Date for PT Re-Evaluation 05/27/17   Authorization Type 3   Authorization Time Period of 10 g code   PT Start Time 7782   PT Stop Time 1519   PT Time Calculation (min) 45 min   Activity Tolerance Patient tolerated treatment well   Behavior During Therapy [facility] for tasks assessed/performed      Past Medical History:  Diagnosis Date  . Cancer [facility]) 2006   breast cancer 2006  . Depression   . Diabetes mellitus without complication ([place])   . Hypertension   . Personal history of radiation therapy     Past Surgical History:  Procedure Laterality Date  . ABDOMINAL HYSTERECTOMY    . BREAST BIOPSY Right 2006   +  . BREAST CYST ASPIRATION Right   . endoscopic carpal tunnel release    . HALLUX VALGUS CORRECTION Left 1983  . HERNIA REPAIR     x2  . left foot surgery Left   . SHOULDER SURGERY Bilateral   . SPINE SURGERY     back    There were no vitals filed for this visit.      Subjective Assessment - 05/12/17 1436    Subjective Haven't had any dizziness. Sat up that first night (after last session) and it was miserable not being able to sleep. Was not dizzy since then even when getting into and out of her bed.  Did yoga earlier at 10 am and had a headache for an hour afterwards.  Not particularly having back pain currently.     Pertinent History DDD, imbalance. Pt states recently moving here from [place] to the [employer]. Back pain recently started again (had back surgery 2006 which helped before). Feels like her age is catching up on her. Bending  forward, cooking bothers her. Pt sometimes feel like she has to use the bathroom more often but has not become enough of a problem for her. Has control of bladder or bowel function. Woke up with a headache and a slight fever this past Monday, went to [place] (was given a fall risk bracelet). Feels fine today. Pt states having problems adjusting to the humidity, got a humidifier, which helped her headaches.  Pt states having balance problems for the past 10 years. Had a fall 1 month ago due to tripping on something. Feels like if she is not careful, she falls. Has never been hurt in a fall before. Her neurologist in [place] recommended for her to use a SPC.  Pt states that she does not have osteoporosis. Had PT for balance before which helped. Feels like her balance is somewhat better due to prior PT.  Has not noticed light headedness or dizziness when she falls. Just feels like she makes a wrong move and has a hard time stopping herself.  Pt states that her L leg bothers her (dermatology thing). 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  The leg is not healing as well as it should. Has diabetes.  Does not bother her when she walks.  Pt also adds that fatigue might play a factor with her falls.   Patient Stated Goals Be able to go back and ride her bike, roller skate, go shopping (for longer hours (more than half a day)   Currently in Pain? No/denies   Pain Score 0-No pain   Pain Onset More than a month ago                                 PT Education - 05/12/17 1517    Education provided Yes   Education Details ther-ex, HEP   Person(s) Educated Patient   Methods Explanation;Demonstration;Tactile cues;Verbal cues;Handout   Comprehension Returned demonstration;Verbalized understanding        Objectives    There-ex  Vitals obtained secondary to pt stating having a headache earlier. Blood pressure L arm sitting, mechanically taken: 111/57,  HR 86  Reviewed epley maneuver/ canalith repositioning with pt for L side. Handout provided. Pt was instructed to perform for other side (instead of L side if she feels like it is not helping or making it worse, also to only perform if someone is there to help her safely) Pt demonstrated and verbalized undrstanding.   Seated manually resisted shoulder extension with pt holding PVC rod 10x3 with 5 seconds to promote abdominal muscle strengthening    Side stepping 32 ft to the R and 32 ft tot he L to promote glute med muscle use and balance  Forward in and out ladder drill slow for balance 4x. LOB x 3. CGA  Seated manually resisted clam shell isometrics hips less than 90 degrees flexion 10x2 with 5 second holds   Seated hip adduction small physioball squeeze with glute max squeeze 10x5 seconds for 2 sets.    Improved exercise technique, movement at target joints, use of target muscles after min to mod verbal, visual, tactile cues.     Improved dizziness with canalith repositioning exercise from last session. Reviewed and gave epley maneuver as part of her HEP to help pt manage dizziness at home. Pt verbalized understanding. Continued working on trunk, hip strengthening and balance to help with back pain and decrease fall risk. No complain of increased pain throughout session.                PT Long Term Goals - 04/26/17 1408      PT LONG TERM GOAL #1   Title Patient will have a decrease in back pain to 3/10 or less at most to promote ability to perform standing tasks such as cooking.    Baseline 6/10 at most for the past 2 months (01/20/2017); 5/10 back pain at most (03/03/2017); 6/10 at most for the past 7 days (04/01/2017); 6-7/10 at most for the past 7 days (04/26/2017)   Time 4   Period Weeks   Status On-going     PT LONG TERM GOAL #2   Title Patient will improve her Modified Oswestry Low Back Pain Disability Questionnaire by at least 12% as a demonstration of improved  function.    Baseline 34% (01/20/2017); 22% (03/03/2017)   Time 4   Period Weeks   Status Achieved     PT LONG TERM GOAL #3   Title Patient will improve her LEFS score by at least 9 points as a demonnstration of improved function.    Baseline 54/80 (01/20/2017); 57/80 (03/03/2017); 48/80 (pt however had R knee discomfort after walking a lit the day before  filling out the questionnaire) 04/20/2017.   Time 4   Period Weeks   Status On-going     PT LONG TERM GOAL #4   Title Pt will improve bilateral LE strength by at least 1/2 MMT grade to promote more steadiness with gait, decrease fall risk, and improve ability to perform standing tasks.    Time 4   Period Weeks   Status Partially Met     PT LONG TERM GOAL #5   Title Pt will improve her 5 times sit to stand time without using hands to 11 seconds or less to promote balance.   Baseline 12 seconds with use of hands (01/20/2017); 11.45 seconds with use of hands (03/03/2017); 12.6 seconds without use of hands (04/01/2017); 11.52 seconds wihtout use of hands (04/26/2017)   Time 4   Period Weeks   Status Partially Met     PT LONG TERM GOAL #6   Title Patient will report decreased dizziness with getting into and out of bed to promote patient comfort and improve balance.   Baseline Increased dizziness with getting into and out of bed (04/01/2017); Less overall dizziness with getting into and out of bed (04/26/2017)   Time 4   Period Weeks   Status On-going               Plan - 05/12/17 1434    Clinical Impression Statement Improved dizziness with canalith repositioning exercise from last session. Reviewed and gave epley maneuver as part of her HEP to help pt manage dizziness at home. Pt verbalized understanding. Continued working on trunk, hip strengthening and balance to help with back pain and decrease fall risk. No complain of increased pain throughout session.     Rehab Potential Fair   Clinical Impairments Affecting Rehab Potential chronicity  of condition   PT Frequency 2x / week   PT Duration 4 weeks   PT Treatment/Interventions Therapeutic exercise;Therapeutic activities;Manual techniques;Patient/family education;Neuromuscular re-education;Ultrasound;Gait training;Iontophoresis 55m/ml Dexamethasone;Electrical Stimulation;Aquatic Therapy   PT Next Visit Plan LE strengthenig, balance, thoracic extension, scapular strengthening, core strengthening   Consulted and Agree with Plan of Care Patient  pt states wanting to take care of her other doctor's appointments first      Patient will benefit from skilled therapeutic intervention in order to improve the following deficits and impairments:  Pain, Postural dysfunction, Improper body mechanics, Difficulty walking, Decreased strength, Decreased balance  Visit Diagnosis: Chronic bilateral low back pain, with sciatica presence unspecified  Muscle weakness (generalized)  History of falling  Unsteadiness on feet     Problem List There are no active problems to display for this patient.  MJoneen BoersPT, DPT   05/12/2017, 7:18 PM  [facility] [address] [place] [address]Phone: [phone]  Fax:  [phone] Name: BMilanna KozlovMRN: [id number] of Birth: 303-05-1932

## 2017-05-12 NOTE — Patient Instructions (Signed)
Gave epley maneuver as part of her HEP to help manage her dizziness if it returns. Handout provided. Pt demonstrated understanding.

## 2017-05-18 ENCOUNTER — Ambulatory Visit: Payer: Medicare Other

## 2017-05-18 DIAGNOSIS — R2681 Unsteadiness on feet: Secondary | ICD-10-CM

## 2017-05-18 DIAGNOSIS — M545 Low back pain: Secondary | ICD-10-CM | POA: Diagnosis not present

## 2017-05-18 DIAGNOSIS — G8929 Other chronic pain: Secondary | ICD-10-CM

## 2017-05-18 DIAGNOSIS — M6281 Muscle weakness (generalized): Secondary | ICD-10-CM

## 2017-05-18 DIAGNOSIS — Z9181 History of falling: Secondary | ICD-10-CM

## 2017-05-18 NOTE — Therapy (Signed)
[place] [facility] [address], [place], [address] Phone: [phone]   Fax:  [phone]  Physical Therapy Treatment  Patient Details  Name: Kelsey Haynes MRN: [id number] Date of Birth: 10/30/1932 Referring Provider: Glendon Axe, MD  Encounter Date: 05/18/2017      PT End of Session - 05/18/17 1037    Visit Number 21   Number of Visits 29   Date for PT Re-Evaluation 05/27/17   Authorization Type 4   Authorization Time Period of 10 g code   PT Start Time 1037   PT Stop Time 1121   PT Time Calculation (min) 44 min   Activity Tolerance Patient tolerated treatment well   Behavior During Therapy [facility] for tasks assessed/performed      Past Medical History:  Diagnosis Date  . Cancer [facility]) 2006   breast cancer 2006  . Depression   . Diabetes mellitus without complication ([place])   . Hypertension   . Personal history of radiation therapy     Past Surgical History:  Procedure Laterality Date  . ABDOMINAL HYSTERECTOMY    . BREAST BIOPSY Right 2006   +  . BREAST CYST ASPIRATION Right   . endoscopic carpal tunnel release    . HALLUX VALGUS CORRECTION Left 1983  . HERNIA REPAIR     x2  . left foot surgery Left   . SHOULDER SURGERY Bilateral   . SPINE SURGERY     back    There were no vitals filed for this visit.      Subjective Assessment - 05/18/17 1040    Subjective Hasn't been dizzy. Back has been pretty good. Going to be in [place] for 2 months.    Pertinent History DDD, imbalance. Pt states recently moving here from [place] to the [employer]. Back pain recently started again (had back surgery 2006 which helped before). Feels like her age is catching up on her. Bending forward, cooking bothers her. Pt sometimes feel like she has to use the bathroom more often but has not become enough of a problem for her. Has control of bladder or bowel function. Woke up with a headache and a slight fever  this past Monday, went to [place] (was given a fall risk bracelet). Feels fine today. Pt states having problems adjusting to the humidity, got a humidifier, which helped her headaches.  Pt states having balance problems for the past 10 years. Had a fall 1 month ago due to tripping on something. Feels like if she is not careful, she falls. Has never been hurt in a fall before. Her neurologist in [place] recommended for her to use a SPC.  Pt states that she does not have osteoporosis. Had PT for balance before which helped. Feels like her balance is somewhat better due to prior PT.  Has not noticed light headedness or dizziness when she falls. Just feels like she makes a wrong move and has a hard time stopping herself.  Pt states that her L leg bothers her (dermatology thing). 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  The leg is not healing as well as it should. Has diabetes.  Does not bother her when she walks.   Pt also adds that fatigue might play a factor with her falls.   Patient Stated Goals Be able to go back and ride her bike, roller skate, go shopping (for longer hours (more than half a day)  Currently in Pain? No/denies   Pain Score 0-No pain  No back pain, just a feeling of tiredness in her back.    Pain Onset More than a month ago                                 PT Education - 05/18/17 1046    Education provided Yes   Education Details ther-ex, HEP   Person(s) Educated Patient   Methods Explanation;Demonstration;Tactile cues;Verbal cues   Comprehension Returned demonstration;Verbalized understanding        Objectives    There-ex    Seated manually resisted shoulder extension isometrics with pt holding PVC rod 10x2 with 5 seconds to promote abdominal muscle strengthening   Seated low rows resisting red band 10x5 seconds   Reviewed and given as part of her HEP up to 3x10 daily with 5 second holds. Pt demonstrated and  verbalized understanding.   Four square step exercise for balance 4x each direction. No LOB.  SLS with opposite tip toe assist and bilateral UE assist 5x5 seconds for 2 sets for R LE to promote R glute med strength   Side stepping 32 ft to the R and 32 ft tot he L to promote glute med muscle use and balance  Seated manually resisted clam shell isometrics hips less than 90 degrees flexion 10x2 with 5 second holds to promote glute med strength   Seated hip adduction small physioball squeeze with glute max squeeze 10x5 seconds for 2 sets.    Improved exercise technique, movement at target joints, use of target muscles after min to mod verbal, visual, tactile cues.  Good carryover of no dizziness and decreasing back pain. No LOB with standing exercises.          PT Long Term Goals - 04/26/17 1408      PT LONG TERM GOAL #1   Title Patient will have a decrease in back pain to 3/10 or less at most to promote ability to perform standing tasks such as cooking.    Baseline 6/10 at most for the past 2 months (01/20/2017); 5/10 back pain at most (03/03/2017); 6/10 at most for the past 7 days (04/01/2017); 6-7/10 at most for the past 7 days (04/26/2017)   Time 4   Period Weeks   Status On-going     PT LONG TERM GOAL #2   Title Patient will improve her Modified Oswestry Low Back Pain Disability Questionnaire by at least 12% as a demonstration of improved function.    Baseline 34% (01/20/2017); 22% (03/03/2017)   Time 4   Period Weeks   Status Achieved     PT LONG TERM GOAL #3   Title Patient will improve her LEFS score by at least 9 points as a demonnstration of improved function.    Baseline 54/80 (01/20/2017); 57/80 (03/03/2017); 48/80 (pt however had R knee discomfort after walking a lit the day before filling out the questionnaire) 04/20/2017.   Time 4   Period Weeks   Status On-going     PT LONG TERM GOAL #4   Title Pt will improve bilateral LE strength by at least 1/2 MMT grade to  promote more steadiness with gait, decrease fall risk, and improve ability to perform standing tasks.    Time 4   Period Weeks   Status Partially Met     PT LONG TERM GOAL #5   Title Pt will improve her 5  times sit to stand time without using hands to 11 seconds or less to promote balance.   Baseline 12 seconds with use of hands (01/20/2017); 11.45 seconds with use of hands (03/03/2017); 12.6 seconds without use of hands (04/01/2017); 11.52 seconds wihtout use of hands (04/26/2017)   Time 4   Period Weeks   Status Partially Met     PT LONG TERM GOAL #6   Title Patient will report decreased dizziness with getting into and out of bed to promote patient comfort and improve balance.   Baseline Increased dizziness with getting into and out of bed (04/01/2017); Less overall dizziness with getting into and out of bed (04/26/2017)   Time 4   Period Weeks   Status On-going               Plan - 05/18/17 1036    Clinical Impression Statement Good carryover of no dizziness and decreasing back pain. No LOB with standing exercises.   Rehab Potential Fair   Clinical Impairments Affecting Rehab Potential chronicity of condition   PT Frequency 2x / week   PT Duration 4 weeks   PT Treatment/Interventions Therapeutic exercise;Therapeutic activities;Manual techniques;Patient/family education;Neuromuscular re-education;Ultrasound;Gait training;Iontophoresis 61m/ml Dexamethasone;Electrical Stimulation;Aquatic Therapy   PT Next Visit Plan LE strengthenig, balance, thoracic extension, scapular strengthening, core strengthening   Consulted and Agree with Plan of Care Patient  pt states wanting to take care of her other doctor's appointments first      Patient will benefit from skilled therapeutic intervention in order to improve the following deficits and impairments:  Pain, Postural dysfunction, Improper body mechanics, Difficulty walking, Decreased strength, Decreased balance  Visit Diagnosis: Chronic  bilateral low back pain, with sciatica presence unspecified  Muscle weakness (generalized)  History of falling  Unsteadiness on feet     Problem List There are no active problems to display for this patient.    MJoneen BoersPT, DPT   05/18/2017, 11:38 AM  [facility] [address] [place] [address]Phone: [phone]  Fax:  [phone] Name: BDarlinda BellowsMRN: [id number] of Birth: 309/05/1932

## 2017-05-18 NOTE — Patient Instructions (Signed)
Gave seated low rows  10x5 seconds up to 3 sets daily resisting red band as part of her HEP. Pt demonstrated and verbalized understanding. Red band provided.

## 2017-05-20 ENCOUNTER — Ambulatory Visit: Payer: Medicare Other

## 2017-05-25 ENCOUNTER — Ambulatory Visit: Payer: Medicare Other

## 2017-05-25 DIAGNOSIS — M6281 Muscle weakness (generalized): Secondary | ICD-10-CM

## 2017-05-25 DIAGNOSIS — G8929 Other chronic pain: Secondary | ICD-10-CM

## 2017-05-25 DIAGNOSIS — M545 Low back pain: Secondary | ICD-10-CM | POA: Diagnosis not present

## 2017-05-25 DIAGNOSIS — R2681 Unsteadiness on feet: Secondary | ICD-10-CM

## 2017-05-25 DIAGNOSIS — Z9181 History of falling: Secondary | ICD-10-CM

## 2017-05-25 NOTE — Patient Instructions (Addendum)
Sitting on a chair:   Pull the red band with both hands towards your hip area to feel an abdominal contraction.    Hold for 5 seconds.    Repeat 10 times.    Perform 3 sets daily.    Also gave R SLS with opposite tip toe assist and bilateral UE assist 5x5 seconds for 3 sessions daily as part of her HEP to promote R glute med strength. Handout provided. Pt demonstrated and verbalized understanding.

## 2017-05-25 NOTE — Therapy (Addendum)
[place] [facility] [address], [place], [address] Phone: [phone]   Fax:  [phone]  Physical Therapy Treatment And Discharge Summary  Patient Details  Name: Kelsey Haynes MRN: [id number] Date of Birth: [birth year] Referring Provider: Glendon Axe, MD  Encounter Date: 05/25/2017      PT End of Session - 05/25/17 1520    Visit Number 22   Number of Visits 29   Date for PT Re-Evaluation 05/27/17   Authorization Type 5   Authorization Time Period of 10 g code   PT Start Time 1520   PT Stop Time 1605   PT Time Calculation (min) 45 min   Activity Tolerance Patient tolerated treatment well   Behavior During Therapy [facility] for tasks assessed/performed      Past Medical History:  Diagnosis Date  . Cancer [facility]) 2006   breast cancer 2006  . Depression   . Diabetes mellitus without complication ([place])   . Hypertension   . Personal history of radiation therapy     Past Surgical History:  Procedure Laterality Date  . ABDOMINAL HYSTERECTOMY    . BREAST BIOPSY Right 2006   +  . BREAST CYST ASPIRATION Right   . endoscopic carpal tunnel release    . HALLUX VALGUS CORRECTION Left 1983  . HERNIA REPAIR     x2  . left foot surgery Left   . SHOULDER SURGERY Bilateral   . SPINE SURGERY     back    There were no vitals filed for this visit.      Subjective Assessment - 05/25/17 1522    Subjective Has not had any dizziness. Has had back pain but usually short lived when the rests. Back is much better. The worst thing is the burning and numbness in the bottom of her feet. Puts Voltaren which helps.  Feels it more on the L side more than the R.  Pt states that her balance is better overall since starting PT. Balance gets in and out of times, sometimes balance is good, sometimes balance is harder. 7/10 back pain at worst for the past 7 days because of moving boxes.     Pertinent History DDD, imbalance. Pt states  recently moving here from [place] to the [employer]. Back pain recently started again (had back surgery 2006 which helped before). Feels like her age is catching up on her. Bending forward, cooking bothers her. Pt sometimes feel like she has to use the bathroom more often but has not become enough of a problem for her. Has control of bladder or bowel function. Woke up with a headache and a slight fever this past Monday, went to [place] (was given a fall risk bracelet). Feels fine today. Pt states having problems adjusting to the humidity, got a humidifier, which helped her headaches.  Pt states having balance problems for the past 10 years. Had a fall 1 month ago due to tripping on something. Feels like if she is not careful, she falls. Has never been hurt in a fall before. Her neurologist in [place] recommended for her to use a SPC.  Pt states that she does not have osteoporosis. Had PT for balance before which helped. Feels like her balance is somewhat better due to prior PT.  Has not noticed light headedness or dizziness when she falls. Just feels like she makes a wrong move and has a hard time stopping herself.  Pt states that her L leg bothers  her (dermatology thing). 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  The leg is not healing as well as it should. Has diabetes.  Does not bother her when she walks.   Pt also adds that fatigue might play a factor with her falls.   Patient Stated Goals Be able to go back and ride her bike, roller skate, go shopping (for longer hours (more than half a day)   Currently in Pain? No/denies   Pain Onset More than a month ago            [facility] PT Assessment - 05/25/17 1543      Observation/Other Assessments   Lower Extremity Functional Scale  48/80     Strength   Right Hip Flexion 4/5   Right Hip ABduction 4-/5   Left Hip Flexion 4/5   Left Hip ABduction 4/5                             PT Education -  05/25/17 1532    Education provided Yes   Education Details ther-ex, HEP   Person(s) Educated Patient   Methods Explanation;Demonstration;Tactile cues;Verbal cues;Handout   Comprehension Returned demonstration;Verbalized understanding        Objectives  Pt states that her balance is better overall since starting PT. Balance gets in and out of times, sometimes balance is good, sometimes balance is harder. 7/10 back pain at worst for the past 7 days because of moving boxes.      There-ex  Slump test for L LE increased L medial great toe tingling sensation when head was extended.   Standing bilateral scapular retraction resisting red band 10x2 with 5 second holds   Standing ankle DF/PF on rocker board with bilateral UE assist 2 min to promote LE mobility  Sit <> stand 3x without UE assist 3x. Bilateral knee discomfort which eases with rest.   Seated bilateral hip adduction ball squeeze 10x2 with 5 second holds with glute max squeeze   Seated manually resisted hip flexion, S/L hip abduction 1x each way for each LE   Sit <> stand 5x without UE assist 11.46 seconds   Reviewed Epley maneuver with pt. Pt verbalized understanding.    Seated low rows resisting red band 10x5 seconds for 2 sets   SLS with opposite tip toe assist and bilateral UE assist 5x5 seconds for 2 sets for R LE to promote R glute med strength    Reviewed and given as part of HEP  Pt was recommended to slightly bend hips and knees a little so long as she feels safe when picking up items such as a box from the floor to protect her back. Pt demonstrated and verbalized understanding.    Improved exercise technique, movement at target joints, use of target muscles after min to mod verbal, visual, tactile cues.    Pt continues to demonstrate good carry over of no dizziness with getting into and out of her bed at home. Slight feeling of something off with supine to sit transfer but no dizziness. Reviewed  Epley maneuver with pt again just in case her symptoms return. Pt verbalized understanding. No complain of increased back pain during session. Pt demonstrates overall improved functional LE strength and balance with pt being able to stand up from a chair 5 times without use of hands under 12 seconds as well as continues to be able to ambulate without use of  AD (used to use a walking stick). Overall, pt demonstrates improved back pain since initial evaluation based on pt subjective reports. Continued working on hip and trunk strengthening to help with back symptoms as well as reviewed ergonomic lifting (so long as pt feels safe with it) to help protect her back when moving stuff around at home such as boxes. Pt tolerated session well without aggravation of symptoms as well demonstrates independence with her home exercise program. Skilled physical therapy services discharged with patient continuing progress with her exercises at home.             PT Long Term Goals - 05/25/17 1824      PT LONG TERM GOAL #1   Title Patient will have a decrease in back pain to 3/10 or less at most to promote ability to perform standing tasks such as cooking.    Baseline 6/10 at most for the past 2 months (01/20/2017); 5/10 back pain at most (03/03/2017); 6/10 at most for the past 7 days (04/01/2017); 6-7/10 at most for the past 7 days (04/26/2017); 7/10 at most for the past 7 days because of moving boxes (05/25/2017)   Time 4   Period Weeks   Status On-going     PT LONG TERM GOAL #2   Title Patient will improve her Modified Oswestry Low Back Pain Disability Questionnaire by at least 12% as a demonstration of improved function.    Baseline 34% (01/20/2017); 22% (03/03/2017)   Time 4   Period Weeks   Status Achieved     PT LONG TERM GOAL #3   Title Patient will improve her LEFS score by at least 9 points as a demonnstration of improved function.    Baseline 54/80 (01/20/2017); 57/80 (03/03/2017); 48/80 (pt however had R knee  discomfort after walking a lit the day before filling out the questionnaire) 04/20/2017.; 48/80 (05/25/2017)   Time 4   Period Weeks   Status On-going     PT LONG TERM GOAL #4   Title Pt will improve bilateral LE strength by at least 1/2 MMT grade to promote more steadiness with gait, decrease fall risk, and improve ability to perform standing tasks.    Time 4   Period Weeks   Status Partially Met     PT LONG TERM GOAL #5   Title Pt will improve her 5 times sit to stand time without using hands to 11 seconds or less to promote balance.   Baseline 12 seconds with use of hands (01/20/2017); 11.45 seconds with use of hands (03/03/2017); 12.6 seconds without use of hands (04/01/2017); 11.52 seconds wihtout use of hands (04/26/2017);  11.46 seconds (05/25/2017)   Time 4   Period Weeks   Status Partially Met     PT LONG TERM GOAL #6   Title Patient will report decreased dizziness with getting into and out of bed to promote patient comfort and improve balance.   Baseline Increased dizziness with getting into and out of bed (04/01/2017); Less overall dizziness with getting into and out of bed (04/26/2017); pt states not having dizziness (05/25/2017)   Time 4   Period Weeks   Status Achieved               Plan - 05/25/17 1518    Clinical Impression Statement Pt continues to demonstrate good carry over of no dizziness with getting into and out of her bed at home. Slight feeling of something off with supine to sit transfer but no dizziness. Reviewed  Epley maneuver with pt again just in case her symptoms return. Pt verbalized understanding. No complain of increased back pain during session. Pt demonstrates overall improved functional LE strength and balance with pt being able to stand up from a chair 5 times without use of hands under 12 seconds as well as continues to be able to ambulate without use of AD (used to use a walking stick). Overall, pt demonstrates improved back pain since initial evaluation  based on pt subjective reports. Continued working on hip and trunk strengthening to help with back symptoms as well as reviewed ergonomic lifting (so long as pt feels safe with it) to help protect her back when moving stuff around at home such as boxes. Pt tolerated session well without aggravation of symptoms as well demonstrates independence with her home exercise program. Skilled physical therapy services discharged with patient continuing progress with her exercises at home.     Clinical Presentation Stable   Clinical Decision Making Low   Rehab Potential Fair   Clinical Impairments Affecting Rehab Potential chronicity of condition   PT Frequency --   PT Duration --   PT Treatment/Interventions Therapeutic exercise;Therapeutic activities;Manual techniques;Patient/family education;Neuromuscular re-education;Gait training   PT Next Visit Plan Continue progress with her home exercise program.    Consulted and Agree with Plan of Care Patient      Patient will benefit from skilled therapeutic intervention in order to improve the following deficits and impairments:  Pain, Postural dysfunction, Improper body mechanics, Difficulty walking, Decreased strength, Decreased balance  Visit Diagnosis: Chronic bilateral low back pain, with sciatica presence unspecified  Muscle weakness (generalized)  History of falling  Unsteadiness on feet       G-Codes - 31-May-2017 1843    Functional Assessment Tool Used (Outpatient Only) Modified Oswestry Low Back Pain Disability Questionnaire, LEFS, clinical presentation, patient interview   Functional Limitation Mobility: Walking and moving around   Mobility: Walking and Moving Around Goal Status [id number]) At least 1 percent but less than 20 percent impaired, limited or restricted   Mobility: Walking and Moving Around Discharge Status [id number]) At least 20 percent but less than 40 percent impaired, limited or restricted      Problem List There are no active  problems to display for this patient.   Joneen Boers PT, DPT   2017/05/31, 6:51 PM  [place] [facility] [address], [place], [address] Phone: [phone]   Fax:  [phone]  Name: Kelsey Haynes MRN: [id number] Date of Birth: 05-Jan-1932

## 2017-05-27 ENCOUNTER — Ambulatory Visit: Payer: Medicare Other

## 2018-02-09 ENCOUNTER — Other Ambulatory Visit: Payer: Self-pay | Admitting: Internal Medicine

## 2018-02-09 DIAGNOSIS — Z1231 Encounter for screening mammogram for malignant neoplasm of breast: Secondary | ICD-10-CM

## 2018-04-15 ENCOUNTER — Ambulatory Visit
Admission: RE | Admit: 2018-04-15 | Discharge: 2018-04-15 | Disposition: A | Discharge status: Home / Self Care | Payer: Medicare Other | Source: Ambulatory Visit | Attending: Internal Medicine | Admitting: Internal Medicine

## 2018-04-15 DIAGNOSIS — Z1231 Encounter for screening mammogram for malignant neoplasm of breast: Secondary | ICD-10-CM | POA: Insufficient documentation

## 2018-04-15 HISTORY — DX: Malignant neoplasm of unspecified site of unspecified female breast: C50.919

## 2018-08-25 ENCOUNTER — Emergency Department
Admission: EM | Admit: 2018-08-25 | Discharge: 2018-08-25 | Disposition: A | Discharge status: Home / Self Care | Payer: Medicare Other | Attending: Emergency Medicine | Admitting: Emergency Medicine

## 2018-08-25 ENCOUNTER — Other Ambulatory Visit: Payer: Self-pay

## 2018-08-25 ENCOUNTER — Encounter: Payer: Self-pay | Admitting: Emergency Medicine

## 2018-08-25 ENCOUNTER — Emergency Department: Payer: Medicare Other

## 2018-08-25 DIAGNOSIS — S0990XA Unspecified injury of head, initial encounter: Secondary | ICD-10-CM

## 2018-08-25 DIAGNOSIS — R51 Headache: Secondary | ICD-10-CM | POA: Diagnosis not present

## 2018-08-25 DIAGNOSIS — Y999 Unspecified external cause status: Secondary | ICD-10-CM | POA: Insufficient documentation

## 2018-08-25 DIAGNOSIS — Z87891 Personal history of nicotine dependence: Secondary | ICD-10-CM | POA: Insufficient documentation

## 2018-08-25 DIAGNOSIS — I10 Essential (primary) hypertension: Secondary | ICD-10-CM | POA: Insufficient documentation

## 2018-08-25 DIAGNOSIS — E119 Type 2 diabetes mellitus without complications: Secondary | ICD-10-CM | POA: Insufficient documentation

## 2018-08-25 DIAGNOSIS — Y9389 Activity, other specified: Secondary | ICD-10-CM | POA: Insufficient documentation

## 2018-08-25 DIAGNOSIS — S0181XA Laceration without foreign body of other part of head, initial encounter: Secondary | ICD-10-CM | POA: Insufficient documentation

## 2018-08-25 DIAGNOSIS — S80211A Abrasion, right knee, initial encounter: Secondary | ICD-10-CM | POA: Insufficient documentation

## 2018-08-25 DIAGNOSIS — Z79899 Other long term (current) drug therapy: Secondary | ICD-10-CM | POA: Insufficient documentation

## 2018-08-25 DIAGNOSIS — W101XXA Fall (on)(from) sidewalk curb, initial encounter: Secondary | ICD-10-CM | POA: Insufficient documentation

## 2018-08-25 DIAGNOSIS — Z853 Personal history of malignant neoplasm of breast: Secondary | ICD-10-CM | POA: Diagnosis not present

## 2018-08-25 DIAGNOSIS — S60416A Abrasion of right little finger, initial encounter: Secondary | ICD-10-CM | POA: Insufficient documentation

## 2018-08-25 DIAGNOSIS — Y92410 Unspecified street and highway as the place of occurrence of the external cause: Secondary | ICD-10-CM | POA: Insufficient documentation

## 2018-08-25 DIAGNOSIS — S0101XA Laceration without foreign body of scalp, initial encounter: Secondary | ICD-10-CM

## 2018-08-25 DIAGNOSIS — S0993XA Unspecified injury of face, initial encounter: Secondary | ICD-10-CM | POA: Diagnosis present

## 2018-08-25 MED ORDER — LIDOCAINE HCL (PF) 1 % IJ SOLN
5.0000 mL | Freq: Once | INTRAMUSCULAR | Status: AC
  Administered 2018-08-25: 5 mL via INTRADERMAL
  Filled 2018-08-25: qty 5

## 2018-08-25 MED ORDER — ACETAMINOPHEN 325 MG PO TABS
ORAL_TABLET | ORAL | Status: AC
  Administered 2018-08-25: 650 mg via ORAL
  Filled 2018-08-25: qty 2

## 2018-08-25 MED ORDER — ACETAMINOPHEN 325 MG PO TABS
650.0000 mg | ORAL_TABLET | Freq: Once | ORAL | Status: AC
  Administered 2018-08-25: 650 mg via ORAL

## 2018-08-25 NOTE — ED Provider Notes (Signed)
[facility] Emergency Department Provider Note  ____________________________________________  Time seen: Approximately 3:29 PM  I have reviewed the triage vital signs and the nursing notes.   HISTORY  Chief Complaint Fall    HPI Kelsey Haynes is a 84 y.o. female with a history of diabetes and hypertension who was getting off of a transport van when she tripped over the curb and fell hitting her right knee, right fifth finger, and forehead on the ground.  Sustained a laceration to the forehead.  No loss of consciousness.  She has some mild generalized headache which is nonradiating without aggravating or alleviating factors.  No neck pain vision changes paresthesias or weakness.      Past Medical History:  Diagnosis Date  . Breast cancer ([place]) right breast  . Cancer ([place]) 2006   breast cancer 2006  . Depression   . Diabetes mellitus without complication ([place])   . Hypertension   . Personal history of radiation therapy      There are no active problems to display for this patient.    Past Surgical History:  Procedure Laterality Date  . ABDOMINAL HYSTERECTOMY    . BREAST BIOPSY Right 2006   +  . BREAST CYST ASPIRATION Right   . endoscopic carpal tunnel release    . HALLUX VALGUS CORRECTION Left 1983  . HERNIA REPAIR     x2  . left foot surgery Left   . SHOULDER SURGERY Bilateral   . SPINE SURGERY     back     Prior to Admission medications   Medication Sig Start Date End Date Taking? Authorizing Provider  Acetaminophen (TYLENOL ARTHRITIS PAIN PO) Take 1 tablet by mouth every 4 (four) hours as needed.    [provider]  CALCIUM PO Take 2 tablets by mouth daily.    [provider]  Cholecalciferol (VITAMIN D3 PO) Take 2 tablets by mouth daily.    [provider]  cyanocobalamin 500 MCG tablet Take 500 mcg by mouth daily.    [provider]  fexofenadine (ALLEGRA) 180 MG tablet Take 180 mg by mouth daily  as needed for allergies or rhinitis.    [provider]  folic acid (FOLVITE) 0.5 MG tablet Take 0.5 mg by mouth daily.    [provider]  Multiple Vitamin (MULTI-VITAMINS) TABS Take 2 tablets by mouth daily.    [provider]  Multiple Vitamins-Minerals (HAIR SKIN AND NAILS FORMULA) TABS Take 2 tablets by mouth daily.    [provider]  Phenazopyridine HCl (AZO-STANDARD PO) Take 1 tablet by mouth daily as needed.    [provider]     Allergies Other   Family History  Problem Relation Age of Onset  . Breast cancer Neg Hx     Social History Social History   Tobacco Use  . Smoking status: Former Smoker    Packs/day: 0.10    Types: Cigarettes    Last attempt to quit: 12/28/1956    Years since quitting: 61.6  . Smokeless tobacco: Never Used  . Tobacco comment: smoked in college  Substance Use Topics  . Alcohol use: Yes    Comment: 1 drink per month  . Drug use: Not on file    Review of Systems  Constitutional:   No fever or chills.  ENT:   No sore throat. No rhinorrhea. Cardiovascular:   No chest pain or syncope. Respiratory:   No dyspnea or cough. Gastrointestinal:   Negative for abdominal pain,  vomiting and diarrhea.  Musculoskeletal: Mild right knee pain All other systems reviewed and are negative except as documented above in ROS and HPI.  ____________________________________________   PHYSICAL EXAM:  VITAL SIGNS: ED Triage Vitals  Enc Vitals Group     BP 08/25/18 1320 (!) 174/98     Pulse Rate 08/25/18 1320 93     Resp 08/25/18 1320 18     Temp 08/25/18 1320 97.7 F (36.5 C)     Temp Source 08/25/18 1320 Oral     SpO2 08/25/18 1320 98 %     Weight 08/25/18 1321 152 lb (68.9 kg)     Height 08/25/18 1321 4' 11.5" (1.511 m)     Head Circumference --      Peak Flow --      Pain Score 08/25/18 1321 9     Pain Loc --      Pain Edu? --      Excl. in [place]? --     Vital signs reviewed, nursing assessments  reviewed.   Constitutional:   Alert and oriented. Non-toxic appearance. Eyes:   Conjunctivae are normal. EOMI. PERRL. ENT      Head:   Normocephalic with 4 cm linear laceration over the right forehead, extends down to periosteum.      Nose:   No congestion/rhinnorhea.  No epistaxis      Mouth/Throat:   MMM, no pharyngeal erythema. No peritonsillar mass.  No intraoral injury.      Neck:   No meningismus. Full ROM.  No midline tenderness Hematological/Lymphatic/Immunilogical:   No cervical lymphadenopathy. Cardiovascular:   RRR. Symmetric bilateral radial and DP pulses.  No murmurs. Cap refill less than 2 seconds. Respiratory:   Normal respiratory effort without tachypnea/retractions. Breath sounds are clear and equal bilaterally. No wheezes/rales/rhonchi. Gastrointestinal:   Soft and nontender. Non distended. There is no CVA tenderness.  No rebound, rigidity, or guarding.  Musculoskeletal:   Normal range of motion in all extremities. No joint effusions.  No lower extremity tenderness.  No edema. Neurologic:   Normal speech and language.  Motor grossly intact. No acute focal neurologic deficits are appreciated.  Skin:    Skin is warm, dry with superficial abrasion over the lateral right knee and right fifth finger PIP joint.  No bony tenderness. ____________________________________________    LABS (pertinent positives/negatives) (all labs ordered are listed, but only abnormal results are displayed) Labs Reviewed - No data to display ____________________________________________   EKG    ____________________________________________    RADIOLOGY  Ct Head Wo Contrast  Result Date: 08/25/2018 CLINICAL DATA:  Tripped on sidewalk. [address] and hit head today. Initial encounter. EXAM: CT HEAD WITHOUT CONTRAST CT CERVICAL SPINE WITHOUT CONTRAST TECHNIQUE: Multidetector CT imaging of the head and cervical spine was performed following the standard protocol without intravenous contrast.  Multiplanar CT image reconstructions of the cervical spine were also generated. COMPARISON:  None. FINDINGS: CT HEAD FINDINGS Brain: Mild generalized atrophy and white matter changes are present bilaterally. No acute infarct, hemorrhage, or mass lesion is present. Ventricles are proportionate to the degree of atrophy. No significant extra-axial fluid collection present. The brainstem and cerebellum are normal. Vascular: Atherosclerotic calcifications are present within the cavernous internal carotid arteries. There is no hyperdense vessel. Skull: Calvarium is intact. No focal lytic or blastic lesions are present. Right supraorbital scalp laceration is present without an underlying fracture. No acute fractures are present. Sinuses/Orbits: The paranasal sinuses and mastoid air cells are clear. Globes and orbits are within normal  limits. CT CERVICAL SPINE FINDINGS Alignment: Grade 1 anterolisthesis at C3-4 measures 4.5 mm. AP alignment is otherwise anatomic. There straightening of the normal cervical lordosis. There is acquired fusion across the disc spaces at C4-5, C5-6, and C6-7. Skull base and vertebrae: The craniocervical junction is within normal limits apart from moderate degenerative changes at C1-2. Prominent soft tissue in some erosions suggests inflammatory arthritic change. No acute abnormalities are present. The anterolisthesis at C3-4 is degenerative. There may be fusion across the disc space at the uncovertebral joints bilaterally at C3-4. Anterior fusion is present at C2-3. Soft tissues and spinal canal: The soft tissues of the neck demonstrate a calcified left thyroid nodule. No significant adenopathy is present. No acute abnormality is present in the spinal canal. Disc levels: Moderate central and bilateral foraminal stenosis is present at C3-4 secondary to anterolisthesis and facet arthropathy. Uncovertebral spurring contributes to left foraminal narrowing at C5-6, C6-7, and C7-T1. Right foraminal  narrowing is present at C4-5, C5-6, and C6-7. Upper chest: The lung apices are clear. IMPRESSION: 1. Right supraorbital scalp laceration without underlying fracture. 2. Moderate atrophy and white matter changes bilaterally. This likely reflects the sequela of chronic microvascular ischemia. 3. Atherosclerosis. 4. Multilevel degenerative changes the cervical spine without acute fracture or traumatic subluxation. 5. Extensive fusion across the disc spaces in the cervical spine is choir. 6. Right greater than left osseous foraminal narrowing as described. Electronically Signed   By: San Morelle M.D.   On: 08/25/2018 14:18   Ct Cervical Spine Wo Contrast  Result Date: 08/25/2018 CLINICAL DATA:  Tripped on sidewalk. [address] and hit head today. Initial encounter. EXAM: CT HEAD WITHOUT CONTRAST CT CERVICAL SPINE WITHOUT CONTRAST TECHNIQUE: Multidetector CT imaging of the head and cervical spine was performed following the standard protocol without intravenous contrast. Multiplanar CT image reconstructions of the cervical spine were also generated. COMPARISON:  None. FINDINGS: CT HEAD FINDINGS Brain: Mild generalized atrophy and white matter changes are present bilaterally. No acute infarct, hemorrhage, or mass lesion is present. Ventricles are proportionate to the degree of atrophy. No significant extra-axial fluid collection present. The brainstem and cerebellum are normal. Vascular: Atherosclerotic calcifications are present within the cavernous internal carotid arteries. There is no hyperdense vessel. Skull: Calvarium is intact. No focal lytic or blastic lesions are present. Right supraorbital scalp laceration is present without an underlying fracture. No acute fractures are present. Sinuses/Orbits: The paranasal sinuses and mastoid air cells are clear. Globes and orbits are within normal limits. CT CERVICAL SPINE FINDINGS Alignment: Grade 1 anterolisthesis at C3-4 measures 4.5 mm. AP alignment is otherwise  anatomic. There straightening of the normal cervical lordosis. There is acquired fusion across the disc spaces at C4-5, C5-6, and C6-7. Skull base and vertebrae: The craniocervical junction is within normal limits apart from moderate degenerative changes at C1-2. Prominent soft tissue in some erosions suggests inflammatory arthritic change. No acute abnormalities are present. The anterolisthesis at C3-4 is degenerative. There may be fusion across the disc space at the uncovertebral joints bilaterally at C3-4. Anterior fusion is present at C2-3. Soft tissues and spinal canal: The soft tissues of the neck demonstrate a calcified left thyroid nodule. No significant adenopathy is present. No acute abnormality is present in the spinal canal. Disc levels: Moderate central and bilateral foraminal stenosis is present at C3-4 secondary to anterolisthesis and facet arthropathy. Uncovertebral spurring contributes to left foraminal narrowing at C5-6, C6-7, and C7-T1. Right foraminal narrowing is present at C4-5, C5-6, and C6-7. Upper chest: The  lung apices are clear. IMPRESSION: 1. Right supraorbital scalp laceration without underlying fracture. 2. Moderate atrophy and white matter changes bilaterally. This likely reflects the sequela of chronic microvascular ischemia. 3. Atherosclerosis. 4. Multilevel degenerative changes the cervical spine without acute fracture or traumatic subluxation. 5. Extensive fusion across the disc spaces in the cervical spine is choir. 6. Right greater than left osseous foraminal narrowing as described. Electronically Signed   By: San Morelle M.D.   On: 08/25/2018 14:18    ____________________________________________   PROCEDURES .Marland KitchenLaceration Repair Date/Time: 08/25/2018 3:31 PM Performed by: Carrie Mew, MD Authorized by: Carrie Mew, MD   Consent:    Consent obtained:  Verbal   Consent given by:  Patient   Risks discussed:  Infection, pain, retained foreign body,  poor cosmetic result and poor wound healing   Alternatives discussed:  No treatment Anesthesia (see MAR for exact dosages):    Anesthesia method:  Local infiltration   Local anesthetic:  Lidocaine 1% w/o epi Laceration details:    Location:  Scalp   Scalp location:  Frontal   Length (cm):  4 Repair type:    Repair type:  Complex Pre-procedure details:    Preparation:  Imaging obtained to evaluate for foreign bodies Exploration:    Limited defect created (wound extended): no     Hemostasis achieved with:  Direct pressure   Wound exploration: entire depth of wound probed and visualized     Wound extent: no foreign bodies/material noted, no underlying fracture noted and no vascular damage noted     Contaminated: no   Treatment:    Area cleansed with:  Saline and Betadine   Amount of cleaning:  Extensive   Irrigation solution:  Sterile saline   Irrigation method:  Pressure wash   Visualized foreign bodies/material removed: no     Debridement:  None   Undermining:  Minimal   Scar revision: no   Subcutaneous repair:    Suture size:  4-0   Suture material:  Fast-absorbing gut   Number of sutures:  1 Skin repair:    Repair method:  Sutures   Suture size:  4-0   Suture material:  Fast-absorbing gut   Suture technique:  Running   Number of sutures:  4 Approximation:    Approximation:  Close Post-procedure details:    Dressing:  Sterile dressing and adhesive bandage   Patient tolerance of procedure:  Tolerated well, no immediate complications    ____________________________________________  DIFFERENTIAL DIAGNOSIS   Subdural hematoma, epidural hematoma, intracranial hemorrhage, cervical spine fracture  CLINICAL IMPRESSION / ASSESSMENT AND PLAN / ED COURSE  Pertinent labs & imaging results that were available during my care of the patient were reviewed by me and considered in my medical decision making (see chart for details).      Clinical Course as of Aug 25 1528  Thu  Aug 25, 2018  1454 CT head and neck unremarkable for acute traumatic injury.  Wound was repaired.  Tetanus is up-to-date per patient.  Stable for discharge home at this time.   [PS]    Clinical Course User Index [PS] Carrie Mew, MD     ____________________________________________   FINAL CLINICAL IMPRESSION(S) / ED DIAGNOSES    Final diagnoses:  Laceration of scalp, initial encounter  Injury of head, initial encounter     ED Discharge Orders    None      Portions of this note were generated with dragon dictation software. Dictation errors may occur despite best attempts  at proofreading.    Carrie Mew, MD 08/25/18 [phone]

## 2018-08-25 NOTE — ED Notes (Addendum)
Pt with approx. 2 inch lac right forehead. Bleeding controlled. Gauze dressing applied. Pt a/o, vss

## 2018-08-25 NOTE — ED Triage Notes (Signed)
Pt presents to ED via [facility] c/o fall with head involvement. Pt reports she was getting out of transport bus and tripped over sidewalk, falling and hitting head. Denies blood thinner use. Tetanus <28yrs. EMS report 2" lac to forehead, wrapped by EMS. Abrasions to R hand and R knee.

## 2018-12-04 ENCOUNTER — Other Ambulatory Visit: Payer: Self-pay

## 2018-12-04 ENCOUNTER — Emergency Department: Payer: Medicare Other

## 2018-12-04 ENCOUNTER — Emergency Department
Admission: EM | Admit: 2018-12-04 | Discharge: 2018-12-04 | Disposition: A | Discharge status: Home / Self Care | Payer: Medicare Other | Attending: Emergency Medicine | Admitting: Emergency Medicine

## 2018-12-04 DIAGNOSIS — E119 Type 2 diabetes mellitus without complications: Secondary | ICD-10-CM | POA: Insufficient documentation

## 2018-12-04 DIAGNOSIS — Z853 Personal history of malignant neoplasm of breast: Secondary | ICD-10-CM | POA: Insufficient documentation

## 2018-12-04 DIAGNOSIS — Z87891 Personal history of nicotine dependence: Secondary | ICD-10-CM | POA: Diagnosis not present

## 2018-12-04 DIAGNOSIS — H539 Unspecified visual disturbance: Secondary | ICD-10-CM | POA: Insufficient documentation

## 2018-12-04 DIAGNOSIS — I10 Essential (primary) hypertension: Secondary | ICD-10-CM | POA: Diagnosis not present

## 2018-12-04 LAB — SEDIMENTATION RATE: SED RATE: 7 mm/h (ref 0–30)

## 2018-12-04 LAB — CBC
HCT: 39.6 % (ref 36.0–46.0)
HEMOGLOBIN: 12.9 g/dL (ref 12.0–15.0)
MCH: 28.6 pg (ref 26.0–34.0)
MCHC: 32.6 g/dL (ref 30.0–36.0)
MCV: 87.8 fL (ref 80.0–100.0)
PLATELETS: 167 10*3/uL (ref 150–400)
RBC: 4.51 MIL/uL (ref 3.87–5.11)
RDW: 12.1 % (ref 11.5–15.5)
WBC: 5.1 10*3/uL (ref 4.0–10.5)
nRBC: 0 % (ref 0.0–0.2)

## 2018-12-04 NOTE — ED Notes (Signed)
Pt arrives with paper from [facility] requesting blood tests. Spoke to Dr. Kerman Passey and received verbal order for ESR and CBC. Paperwork states CRP, ED MD stated that CRP can't be done at this facility. No scans at this time.

## 2018-12-04 NOTE — ED Notes (Signed)
[place] sent to lab at this time.  

## 2018-12-04 NOTE — ED Provider Notes (Signed)
[facility] Emergency Department Provider Note  ____________________________________________  Time seen: Approximately 7:22 PM  I have reviewed the triage vital signs and the nursing notes.   HISTORY  Chief Complaint Blurred Vision    HPI Kelsey Haynes is a 84 y.o. female with a history of breast cancer, diabetes, hypertension who was sent to the ED from the ophthalmology clinic today due to having blurry vision in the left eye that started at 9 AM.  It felt like it was improving by the time she went to ophthalmology but she states that after the thorough eye exam, her eye feels tired fatigued.  Otherwise she feels like her vision is back to normal.  She denies any significant headaches.  She denies jaw claudication or any jaw pain.  Never had anything like this before.   She was sent by the ophthalmologist with a request for checking ESR, CRP, CBC with concern for temporal arteritis.  She does report a few falls over the past couple months that were overall un-concerning with minor head trauma but now she wonders if she might of sustained some serious injury given the unusual symptoms today.  She does not take any blood thinners   Past Medical History:  Diagnosis Date  . Breast cancer ([place]) right breast  . Cancer ([place]) 2006   breast cancer 2006  . Depression   . Diabetes mellitus without complication ([place])   . Hypertension   . Personal history of radiation therapy      There are no active problems to display for this patient.    Past Surgical History:  Procedure Laterality Date  . ABDOMINAL HYSTERECTOMY    . BREAST BIOPSY Right 2006   +  . BREAST CYST ASPIRATION Right   . endoscopic carpal tunnel release    . HALLUX VALGUS CORRECTION Left 1983  . HERNIA REPAIR     x2  . left foot surgery Left   . SHOULDER SURGERY Bilateral   . SPINE SURGERY     back     Prior to Admission medications   Medication Sig Start Date End Date Taking?  Authorizing Provider  CALCIUM PO Take 2 tablets by mouth daily.   Yes [provider]  Cholecalciferol (VITAMIN D3 PO) Take 2 tablets by mouth daily.   Yes [provider]  cyanocobalamin 500 MCG tablet Take 500 mcg by mouth daily.   Yes [provider]  fexofenadine (ALLEGRA) 180 MG tablet Take 180 mg by mouth daily as needed for allergies or rhinitis.   Yes [provider]  folic acid (FOLVITE) 0.5 MG tablet Take 0.5 mg by mouth daily.   Yes [provider]  Multiple Vitamin (MULTI-VITAMINS) TABS Take 2 tablets by mouth daily.   Yes [provider]  Multiple Vitamins-Minerals (HAIR SKIN AND NAILS FORMULA) TABS Take 2 tablets by mouth daily.   Yes [provider]  Phenazopyridine HCl (AZO-STANDARD PO) Take 1 tablet by mouth daily as needed.   Yes [provider]  Acetaminophen (TYLENOL ARTHRITIS PAIN PO) Take 1 tablet by mouth every 4 (four) hours as needed.    [provider]     Allergies Other   Family History  Problem Relation Age of Onset  . Breast cancer Neg Hx     Social History Social History   Tobacco Use  . Smoking status: Former Smoker    Packs/day: 0.10    Types: Cigarettes    Last attempt to quit: 12/28/1956  Years since quitting: 61.9  . Smokeless tobacco: Never Used  . Tobacco comment: smoked in college  Substance Use Topics  . Alcohol use: Yes    Comment: 1 drink per month  . Drug use: Not on file    Review of Systems  Constitutional:   No fever or chills.  ENT:   No sore throat. No rhinorrhea. Cardiovascular:   No chest pain or syncope. Respiratory:   No dyspnea or cough. Gastrointestinal:   Negative for abdominal pain, vomiting and diarrhea.  Musculoskeletal:   Negative for focal pain or swelling All other systems reviewed and are negative except as documented above in ROS and HPI.  ____________________________________________   PHYSICAL EXAM:  VITAL SIGNS: ED  Triage Vitals  Enc Vitals Group     BP 12/04/18 1434 (!) 150/79     Pulse Rate 12/04/18 1434 95     Resp 12/04/18 1434 16     Temp 12/04/18 1434 97.7 F (36.5 C)     Temp Source 12/04/18 1434 Oral     SpO2 12/04/18 1434 96 %     Weight 12/04/18 1436 150 lb (68 kg)     Height 12/04/18 1436 '4\' 11"'$  (1.499 m)     Head Circumference --      Peak Flow --      Pain Score 12/04/18 1435 0     Pain Loc --      Pain Edu? --      Excl. in [place]? --     Vital signs reviewed, nursing assessments reviewed.   Constitutional:   Alert and oriented. Non-toxic appearance. Eyes:   Conjunctivae are normal. EOMI. pupils are pharmacologically dilated and unreactive. no nystagmus. ENT      Head:   Normocephalic and atraumatic.  Temporal arteries nontender.      Nose:   No congestion/rhinnorhea.       Mouth/Throat:   MMM, no pharyngeal erythema. No peritonsillar mass.       Neck:   No meningismus. Full ROM. Hematological/Lymphatic/Immunilogical:   No cervical lymphadenopathy. Cardiovascular:   RRR. Symmetric bilateral radial and DP pulses.  No murmurs. Cap refill less than 2 seconds. Respiratory:   Normal respiratory effort without tachypnea/retractions. Breath sounds are clear and equal bilaterally. No wheezes/rales/rhonchi. Gastrointestinal:   Soft and nontender. Non distended. There is no CVA tenderness.  No rebound, rigidity, or guarding.  Musculoskeletal:   Normal range of motion in all extremities. No joint effusions.  No lower extremity tenderness.  No edema. Neurologic:   Normal speech and language.  Cranial nerves III through XII intact No pronator drift, normal cerebellar function, normal gait. Motor grossly intact. No acute focal neurologic deficits are appreciated.  Skin:    Skin is warm, dry and intact. No rash noted.  No petechiae, purpura, or bullae.  ____________________________________________    LABS (pertinent positives/negatives) (all labs ordered are listed, but only abnormal  results are displayed) Labs Reviewed  SEDIMENTATION RATE  CBC   ____________________________________________   EKG    ____________________________________________    RADIOLOGY  Ct Head Wo Contrast  Result Date: 12/04/2018 CLINICAL DATA:  Blurred vision in left eye EXAM: CT HEAD WITHOUT CONTRAST TECHNIQUE: Contiguous axial images were obtained from the base of the skull through the vertex without intravenous contrast. COMPARISON:  08/25/2018 FINDINGS: Brain: No evidence of acute infarction, hemorrhage, hydrocephalus, extra-axial collection or mass lesion/mass effect. Subcortical white matter and periventricular small vessel ischemic changes. Vascular: No hyperdense vessel or unexpected calcification. Skull: Normal. Negative  for fracture or focal lesion. Sinuses/Orbits: The visualized paranasal sinuses are essentially clear. The mastoid air cells are unopacified. Other: None. IMPRESSION: No evidence of acute intracranial abnormality. Mild small vessel ischemic changes. Electronically Signed   By: Julian Hy M.D.   On: 12/04/2018 19:04    ____________________________________________   PROCEDURES Procedures  ____________________________________________  DIFFERENTIAL DIAGNOSIS   TIA, visual disturbance, subdural hematoma  CLINICAL IMPRESSION / ASSESSMENT AND PLAN / ED COURSE  Pertinent labs & imaging results that were available during my care of the patient were reviewed by me and considered in my medical decision making (see chart for details).      Clinical Course as of Dec 04 1921  Sun Dec 04, 2018  1733 No si/ sx of GCA at present. Labs nl. No indication for steroids. Will CT head, if non acute will DC back to oph. F/u tomorrow.    [PS]  1918 CT head negative.  Low suspicion for stroke or TIA at this point, patient could continue TIA work-up as an outpatient if warranted.  She has follow-up with ophthalmology tomorrow, she is stable for discharge home in good  condition at this time.   [PS]    Clinical Course User Index [PS] Carrie Mew, MD     ____________________________________________   FINAL CLINICAL IMPRESSION(S) / ED DIAGNOSES    Final diagnoses:  Visual disturbance     ED Discharge Orders    None      Portions of this note were generated with dragon dictation software. Dictation errors may occur despite best attempts at proofreading.    Carrie Mew, MD 12/04/18 1929

## 2018-12-04 NOTE — ED Triage Notes (Signed)
C/o blurred vision in L eye that began about 9am. States better than was Ambulance person. Denies double vision. Able to see peripherally.   A&O, ambulatory, speaking in clear complete sentences, no drift, no droop, no weakness noted. Denies blood thinner use. Wearing glasses. Denies any head trauma.   Was sent from [facility].

## 2018-12-04 NOTE — Discharge Instructions (Addendum)
Your ESR, CBC, and CT scan of the head today were all okay.  This hospital is not able to perform a CRP test, which your doctor also wanted.  Continue following up with your doctors to continue monitoring your symptoms.  Results for orders placed or performed during the hospital encounter of 12/04/18  Sedimentation rate  Result Value Ref Range   Sed Rate 7 0 - 30 mm/hr  CBC  Result Value Ref Range   WBC 5.1 4.0 - 10.5 K/uL   RBC 4.51 3.87 - 5.11 MIL/uL   Hemoglobin 12.9 12.0 - 15.0 g/dL   HCT 39.6 36.0 - 46.0 %   MCV 87.8 80.0 - 100.0 fL   MCH 28.6 26.0 - 34.0 pg   MCHC 32.6 30.0 - 36.0 g/dL   RDW 12.1 11.5 - 15.5 %   Platelets 167 150 - 400 K/uL   nRBC 0.0 0.0 - 0.2 %   Ct Head Wo Contrast  Result Date: 12/04/2018 CLINICAL DATA:  Blurred vision in left eye EXAM: CT HEAD WITHOUT CONTRAST TECHNIQUE: Contiguous axial images were obtained from the base of the skull through the vertex without intravenous contrast. COMPARISON:  08/25/2018 FINDINGS: Brain: No evidence of acute infarction, hemorrhage, hydrocephalus, extra-axial collection or mass lesion/mass effect. Subcortical white matter and periventricular small vessel ischemic changes. Vascular: No hyperdense vessel or unexpected calcification. Skull: Normal. Negative for fracture or focal lesion. Sinuses/Orbits: The visualized paranasal sinuses are essentially clear. The mastoid air cells are unopacified. Other: None. IMPRESSION: No evidence of acute intracranial abnormality. Mild small vessel ischemic changes. Electronically Signed   By: Julian Hy M.D.   On: 12/04/2018 19:04

## 2018-12-05 LAB — C-REACTIVE PROTEIN

## 2019-06-16 ENCOUNTER — Emergency Department
Admission: EM | Admit: 2019-06-16 | Discharge: 2019-06-16 | Disposition: A | Discharge status: Home / Self Care | Payer: Medicare Other | Attending: Student in an Organized Health Care Education/Training Program | Admitting: Student in an Organized Health Care Education/Training Program

## 2019-06-16 ENCOUNTER — Other Ambulatory Visit: Payer: Self-pay

## 2019-06-16 ENCOUNTER — Emergency Department: Payer: Medicare Other

## 2019-06-16 ENCOUNTER — Encounter: Payer: Self-pay | Admitting: Emergency Medicine

## 2019-06-16 DIAGNOSIS — Z923 Personal history of irradiation: Secondary | ICD-10-CM | POA: Insufficient documentation

## 2019-06-16 DIAGNOSIS — Z853 Personal history of malignant neoplasm of breast: Secondary | ICD-10-CM | POA: Insufficient documentation

## 2019-06-16 DIAGNOSIS — E119 Type 2 diabetes mellitus without complications: Secondary | ICD-10-CM | POA: Insufficient documentation

## 2019-06-16 DIAGNOSIS — Z79899 Other long term (current) drug therapy: Secondary | ICD-10-CM | POA: Diagnosis not present

## 2019-06-16 DIAGNOSIS — I10 Essential (primary) hypertension: Secondary | ICD-10-CM | POA: Diagnosis not present

## 2019-06-16 DIAGNOSIS — R079 Chest pain, unspecified: Secondary | ICD-10-CM | POA: Insufficient documentation

## 2019-06-16 DIAGNOSIS — R0789 Other chest pain: Secondary | ICD-10-CM | POA: Diagnosis present

## 2019-06-16 DIAGNOSIS — Z87891 Personal history of nicotine dependence: Secondary | ICD-10-CM | POA: Diagnosis not present

## 2019-06-16 LAB — CBC
HCT: 39.8 % (ref 36.0–46.0)
Hemoglobin: 13.2 g/dL (ref 12.0–15.0)
MCH: 29.7 pg (ref 26.0–34.0)
MCHC: 33.2 g/dL (ref 30.0–36.0)
MCV: 89.6 fL (ref 80.0–100.0)
Platelets: 165 10*3/uL (ref 150–400)
RBC: 4.44 MIL/uL (ref 3.87–5.11)
RDW: 12.3 % (ref 11.5–15.5)
WBC: 6.4 10*3/uL (ref 4.0–10.5)
nRBC: 0 % (ref 0.0–0.2)

## 2019-06-16 LAB — BASIC METABOLIC PANEL
Anion gap: 9 (ref 5–15)
BUN: 20 mg/dL (ref 8–23)
CO2: 28 mmol/L (ref 22–32)
Calcium: 8.8 mg/dL — ABNORMAL LOW (ref 8.9–10.3)
Chloride: 104 mmol/L (ref 98–111)
Creatinine, Ser: 1 mg/dL (ref 0.44–1.00)
GFR calc Af Amer: 59 mL/min — ABNORMAL LOW (ref >60)
GFR calc non Af Amer: 51 mL/min — ABNORMAL LOW (ref >60)
Glucose, Bld: 138 mg/dL — ABNORMAL HIGH (ref 70–99)
Potassium: 3.8 mmol/L (ref 3.5–5.1)
Sodium: 141 mmol/L (ref 135–145)

## 2019-06-16 LAB — TROPONIN I
Troponin I: <0.03 ng/mL (ref <0.03)
Troponin I: <0.03 ng/mL (ref <0.03)

## 2019-06-16 MED ORDER — LIDOCAINE 5 % EX PTCH
1.0000 | MEDICATED_PATCH | CUTANEOUS | Status: DC
  Administered 2019-06-16: 1 via TRANSDERMAL
  Filled 2019-06-16: qty 1

## 2019-06-16 NOTE — ED Provider Notes (Signed)
[facility] Department Provider Note    First MD Initiated Contact with Patient 06/16/19 1249     (approximate)  I have reviewed the triage vital signs and the nursing notes.   HISTORY  Chief Complaint Chest Pain    HPI Jelani Vreeland is a 85 y.o. female the below listed past medical history presents the ER for intermittent brief episodes of left chest wall pain.  Past week.  States that just resting pain will resolve.  Denies any diaphoresis or shortness of breath.  No nausea or vomiting.  Has had similar episodes in the past.  Has been having it intermittently for the past week.  Denies any pain at rest right now.  Pain is reproduced with pressing left chest.  She has difficulty describing the pain other than "achy."    Past Medical History:  Diagnosis Date  . Breast cancer ([place]) right breast  . Cancer ([place]) 2006   breast cancer 2006  . Depression   . Diabetes mellitus without complication ([place])   . Hypertension   . Personal history of radiation therapy    Family History  Problem Relation Age of Onset  . Breast cancer Neg Hx    Past Surgical History:  Procedure Laterality Date  . ABDOMINAL HYSTERECTOMY    . BREAST BIOPSY Right 2006   +  . BREAST CYST ASPIRATION Right   . endoscopic carpal tunnel release    . HALLUX VALGUS CORRECTION Left 1983  . HERNIA REPAIR     x2  . left foot surgery Left   . SHOULDER SURGERY Bilateral   . SPINE SURGERY     back   There are no active problems to display for this patient.     Prior to Admission medications   Medication Sig Start Date End Date Taking? Authorizing Provider  Acetaminophen (TYLENOL ARTHRITIS PAIN PO) Take 1 tablet by mouth every 4 (four) hours as needed.   Yes [provider]  diclofenac sodium (VOLTAREN) 1 % GEL APPLY 2G TOPICALLY 2 (TWO) TIMES DAILY. 05/04/18  Yes [provider]  Multiple Vitamin (MULTI-VITAMINS) TABS Take 2 tablets by mouth daily.   Yes  [provider]  Multiple Vitamins-Minerals (HAIR SKIN AND NAILS FORMULA) TABS Take 2 tablets by mouth daily.   Yes [provider]    Allergies Other    Social History Social History   Tobacco Use  . Smoking status: Former Smoker    Packs/day: 0.10    Types: Cigarettes    Quit date: 12/28/1956    Years since quitting: 62.5  . Smokeless tobacco: Never Used  . Tobacco comment: smoked in college  Substance Use Topics  . Alcohol use: Yes    Comment: 1 drink per month  . Drug use: Not on file    Review of Systems Patient denies headaches, rhinorrhea, blurry vision, numbness, shortness of breath, chest pain, edema, cough, abdominal pain, nausea, vomiting, diarrhea, dysuria, fevers, rashes or hallucinations unless otherwise stated above in HPI. ____________________________________________   PHYSICAL EXAM:  VITAL SIGNS: Vitals:   06/16/19 1251 06/16/19 1256  BP: (!) 162/87   Pulse:  86  Resp: 15 16  Temp:    SpO2:  100%    Constitutional: Alert and oriented.  Eyes: Conjunctivae are normal.  Head: Atraumatic. Nose: No congestion/rhinnorhea. Mouth/Throat: Mucous membranes are moist.   Neck: No stridor. Painless ROM.  Cardiovascular: Normal rate, regular rhythm. Grossly normal heart sounds.  Good peripheral circulation. Respiratory: Normal respiratory  effort.  No retractions. Lungs CTAB. Gastrointestinal: Soft and nontender. No distention. No abdominal bruits. No CVA tenderness. Genitourinary:  Musculoskeletal: Largely reproducible pain with palpation of the left anterior chest wall.  No overlying crepitus erythema or blistering.   No contusion.  No lower extremity tenderness nor edema.  No joint effusions. Neurologic:  Normal speech and language. No gross focal neurologic deficits are appreciated. No facial droop Skin:  Skin is warm, dry and intact. No rash noted. Psychiatric: Mood and affect are normal. Speech and behavior are normal.   ____________________________________________   LABS (all labs ordered are listed, but only abnormal results are displayed)  Results for orders placed or performed during the hospital encounter of 06/16/19 (from the past 24 hour(s))  Basic metabolic panel     Status: Abnormal   Collection Time: 06/16/19 10:53 AM  Result Value Ref Range   Sodium 141 135 - 145 mmol/L   Potassium 3.8 3.5 - 5.1 mmol/L   Chloride 104 98 - 111 mmol/L   CO2 28 22 - 32 mmol/L   Glucose, Bld 138 (H) 70 - 99 mg/dL   BUN 20 8 - 23 mg/dL   Creatinine, Ser 1.00 0.44 - 1.00 mg/dL   Calcium 8.8 (L) 8.9 - 10.3 mg/dL   GFR calc non Af Amer 51 (L) >60 mL/min   GFR calc Af Amer 59 (L) >60 mL/min   Anion gap 9 5 - 15  CBC     Status: None   Collection Time: 06/16/19 10:53 AM  Result Value Ref Range   WBC 6.4 4.0 - 10.5 K/uL   RBC 4.44 3.87 - 5.11 MIL/uL   Hemoglobin 13.2 12.0 - 15.0 g/dL   HCT 39.8 36.0 - 46.0 %   MCV 89.6 80.0 - 100.0 fL   MCH 29.7 26.0 - 34.0 pg   MCHC 33.2 30.0 - 36.0 g/dL   RDW 12.3 11.5 - 15.5 %   Platelets 165 150 - 400 K/uL   nRBC 0.0 0.0 - 0.2 %  Troponin I - ONCE - STAT     Status: None   Collection Time: 06/16/19 10:53 AM  Result Value Ref Range   Troponin I <0.03 <0.03 ng/mL  Troponin I - ONCE - STAT     Status: None   Collection Time: 06/16/19  1:58 PM  Result Value Ref Range   Troponin I <0.03 <0.03 ng/mL   ____________________________________________  EKG My review and personal interpretation at Time: 10:46   Indication: chest pain  Rate: 80  Rhythm: sinus Axis: normal Other: normal intervals, no stemi ____________________________________________  RADIOLOGY  I personally reviewed all radiographic images ordered to evaluate for the above acute complaints and reviewed radiology reports and findings.  These findings were personally discussed with the patient.  Please see medical record for radiology report.  ____________________________________________   PROCEDURES   Procedure(s) performed:  Procedures    Critical Care performed: no ____________________________________________   INITIAL IMPRESSION / ASSESSMENT AND PLAN / ED COURSE  Pertinent labs & imaging results that were available during my care of the patient were reviewed by me and considered in my medical decision making (see chart for details).   DDX: Skeletal pain, costochondritis, pleurisy, pneumonia, COVID-19, ACS, pneumonia  Gerrianne Aydelott is a 85 y.o. who presents to the ED with symptoms as described above.  Patient well-appearing in no acute distress.  Pain very reproducible.  EKG is nonischemic.  Will be observed in the ER for serial enzymes and on telemetry given  her age and risk factors.  Her abdominal exam is soft and benign.  Not consistent with dissection or PE.  No signs or symptoms of infectious process.  Clinical Course as of Jun 15 1450  Fri Jun 16, 2019  1446 Repeat troponin is negative.  Seems primarily musculoskeletal.  Patient's pain controlled.  Does appear stable appropriate for outpatient follow-up.   [PR]    Clinical Course User Index [PR] Merlyn Lot, MD    The patient was evaluated in Emergency Department today for the symptoms described in the history of present illness. He/she was evaluated in the context of the global COVID-19 pandemic, which necessitated consideration that the patient might be at risk for infection with the SARS-CoV-2 virus that causes COVID-19. Institutional protocols and algorithms that pertain to the evaluation of patients at risk for COVID-19 are in a state of rapid change based on information released by regulatory bodies including the CDC and federal and state organizations. These policies and algorithms were followed during the patient's care in the ED.  As part of my medical decision making, I reviewed the following data within the [place] notes reviewed and incorporated, Labs reviewed, notes from prior ED  visits and [place] Controlled Substance Database   ____________________________________________   FINAL CLINICAL IMPRESSION(S) / ED DIAGNOSES  Final diagnoses:  Chest pain      NEW MEDICATIONS STARTED DURING THIS VISIT:  New Prescriptions   No medications on file     Note:  This document was prepared using Dragon voice recognition software and may include unintentional dictation errors.    Merlyn Lot, MD 06/16/19 1454

## 2019-06-16 NOTE — ED Notes (Signed)
Pt c/o pain under L breast that started at approx 0600 today. Unsure what makes pain better/worse. Pain produceable with palpation however, unrelated to CP as patient states she has fibromyalgia. Pt states unsure if pain to L shoulder from CP or is from torn rotator cuff. Pt is alert and oriented, however noted to be poor historian when answering questions regarding CP.

## 2019-06-16 NOTE — ED Triage Notes (Signed)
Pt to ER states she had an episode 1-2 weeks ago of pain under her left breast and noted HTN.  States similar pain today and noted mild HTN again.  Pt states has been told in the past that she has costochondritis, but this pain is different.  PT denies radiation of pain, denies nausea or SHOB.  Does state she feels "something" but can't describe it as SHOB.

## 2019-07-03 ENCOUNTER — Other Ambulatory Visit
Admission: RE | Admit: 2019-07-03 | Discharge: 2019-07-03 | Disposition: A | Discharge status: Home / Self Care | Payer: Medicare Other | Source: Ambulatory Visit | Attending: Internal Medicine | Admitting: Internal Medicine

## 2019-07-03 ENCOUNTER — Other Ambulatory Visit: Payer: Self-pay

## 2019-07-03 DIAGNOSIS — Z1159 Encounter for screening for other viral diseases: Secondary | ICD-10-CM | POA: Insufficient documentation

## 2019-07-03 DIAGNOSIS — Z01812 Encounter for preprocedural laboratory examination: Secondary | ICD-10-CM | POA: Diagnosis present

## 2019-07-04 LAB — SARS CORONAVIRUS 2 (TAT 6-24 HRS): SARS Coronavirus 2: NEGATIVE

## 2019-07-06 ENCOUNTER — Encounter: Payer: Self-pay | Admitting: *Deleted

## 2019-07-06 ENCOUNTER — Ambulatory Visit
Admission: RE | Admit: 2019-07-06 | Discharge: 2019-07-06 | Disposition: A | Discharge status: Home / Self Care | Payer: Medicare Other | Attending: Internal Medicine | Admitting: Internal Medicine

## 2019-07-06 ENCOUNTER — Encounter
Admission: RE | Disposition: A | Discharge status: Home / Self Care | Payer: Self-pay | Source: Home / Self Care | Attending: Internal Medicine

## 2019-07-06 ENCOUNTER — Other Ambulatory Visit: Payer: Self-pay

## 2019-07-06 DIAGNOSIS — M1990 Unspecified osteoarthritis, unspecified site: Secondary | ICD-10-CM | POA: Insufficient documentation

## 2019-07-06 DIAGNOSIS — I10 Essential (primary) hypertension: Secondary | ICD-10-CM | POA: Diagnosis not present

## 2019-07-06 DIAGNOSIS — Z853 Personal history of malignant neoplasm of breast: Secondary | ICD-10-CM | POA: Insufficient documentation

## 2019-07-06 DIAGNOSIS — R011 Cardiac murmur, unspecified: Secondary | ICD-10-CM | POA: Insufficient documentation

## 2019-07-06 DIAGNOSIS — Z87891 Personal history of nicotine dependence: Secondary | ICD-10-CM | POA: Insufficient documentation

## 2019-07-06 DIAGNOSIS — E119 Type 2 diabetes mellitus without complications: Secondary | ICD-10-CM | POA: Diagnosis not present

## 2019-07-06 DIAGNOSIS — Z79899 Other long term (current) drug therapy: Secondary | ICD-10-CM | POA: Insufficient documentation

## 2019-07-06 DIAGNOSIS — Z7982 Long term (current) use of aspirin: Secondary | ICD-10-CM | POA: Diagnosis not present

## 2019-07-06 DIAGNOSIS — I200 Unstable angina: Secondary | ICD-10-CM | POA: Diagnosis present

## 2019-07-06 HISTORY — PX: LEFT HEART CATH AND CORONARY ANGIOGRAPHY: CATH118249

## 2019-07-06 SURGERY — LEFT HEART CATH AND CORONARY ANGIOGRAPHY
Anesthesia: Moderate Sedation | Laterality: Left

## 2019-07-06 SURGERY — LEFT HEART CATH AND CORONARY ANGIOGRAPHY
Anesthesia: Moderate Sedation

## 2019-07-06 MED ORDER — VERAPAMIL HCL 2.5 MG/ML IV SOLN
INTRAVENOUS | Status: DC | PRN
  Administered 2019-07-06: 2.5 mg via INTRA_ARTERIAL

## 2019-07-06 MED ORDER — VERAPAMIL HCL 2.5 MG/ML IV SOLN
INTRAVENOUS | Status: AC
  Filled 2019-07-06: qty 2

## 2019-07-06 MED ORDER — SODIUM CHLORIDE 0.9 % WEIGHT BASED INFUSION
200.0000 mL/h | INTRAVENOUS | Status: AC
  Administered 2019-07-06: 13:00:00 3 mL/kg/h via INTRAVENOUS

## 2019-07-06 MED ORDER — HEPARIN (PORCINE) IN NACL 1000-0.9 UT/500ML-% IV SOLN
INTRAVENOUS | Status: DC | PRN
  Administered 2019-07-06: 500 mL

## 2019-07-06 MED ORDER — HEPARIN SODIUM (PORCINE) 1000 UNIT/ML IJ SOLN
INTRAMUSCULAR | Status: AC
  Filled 2019-07-06: qty 1

## 2019-07-06 MED ORDER — SODIUM CHLORIDE 0.9 % IV SOLN: 250.0000 mL | INTRAVENOUS | Status: DC | PRN

## 2019-07-06 MED ORDER — HEPARIN SODIUM (PORCINE) 1000 UNIT/ML IJ SOLN
INTRAMUSCULAR | Status: DC | PRN
  Administered 2019-07-06: 4000 [IU] via INTRAVENOUS

## 2019-07-06 MED ORDER — ASPIRIN 81 MG PO CHEW: 81.0000 mg | CHEWABLE_TABLET | ORAL | Status: DC

## 2019-07-06 MED ORDER — HEPARIN (PORCINE) IN NACL 1000-0.9 UT/500ML-% IV SOLN
INTRAVENOUS | Status: AC
  Filled 2019-07-06: qty 1000

## 2019-07-06 MED ORDER — IOHEXOL 300 MG/ML  SOLN
INTRAMUSCULAR | Status: DC | PRN
  Administered 2019-07-06: 70 mL via INTRA_ARTERIAL

## 2019-07-06 MED ORDER — SODIUM CHLORIDE 0.9 % WEIGHT BASED INFUSION: 1.0000 mL/kg/h | INTRAVENOUS | Status: DC

## 2019-07-06 MED ORDER — FENTANYL CITRATE (PF) 100 MCG/2ML IJ SOLN
INTRAMUSCULAR | Status: DC | PRN
  Administered 2019-07-06: 25 ug via INTRAVENOUS

## 2019-07-06 MED ORDER — MIDAZOLAM HCL 2 MG/2ML IJ SOLN
INTRAMUSCULAR | Status: DC | PRN
  Administered 2019-07-06: 1 mg via INTRAVENOUS

## 2019-07-06 MED ORDER — SODIUM CHLORIDE 0.9% FLUSH: 3.0000 mL | INTRAVENOUS | Status: DC | PRN

## 2019-07-06 MED ORDER — SODIUM CHLORIDE 0.9% FLUSH: 3.0000 mL | Freq: Two times a day (BID) | INTRAVENOUS | Status: DC

## 2019-07-06 MED ORDER — FENTANYL CITRATE (PF) 100 MCG/2ML IJ SOLN
INTRAMUSCULAR | Status: AC
  Filled 2019-07-06: qty 2

## 2019-07-06 MED ORDER — MIDAZOLAM HCL 2 MG/2ML IJ SOLN
INTRAMUSCULAR | Status: AC
  Filled 2019-07-06: qty 2

## 2019-07-06 SURGICAL SUPPLY — 9 items
CATH INFINITI 5 FR JL3.5 (CATHETERS) ×1 IMPLANT
CATH INFINITI 5FR ANG PIGTAIL (CATHETERS) ×1 IMPLANT
CATH INFINITI JR4 5F (CATHETERS) ×1 IMPLANT
DEVICE RAD TR BAND REGULAR (VASCULAR PRODUCTS) ×1 IMPLANT
GLIDESHEATH SLEND SS 6F .021 (SHEATH) ×1 IMPLANT
KIT MANI 3VAL PERCEP (MISCELLANEOUS) ×2 IMPLANT
PACK CARDIAC CATH (CUSTOM PROCEDURE TRAY) ×2 IMPLANT
WIRE HITORQ VERSACORE ST 145CM (WIRE) ×1 IMPLANT
WIRE ROSEN-J .035X260CM (WIRE) ×1 IMPLANT

## 2019-07-06 NOTE — Discharge Instructions (Signed)
Radial Site Care Refer to this sheet in the next few weeks. These instructions provide you with information about caring for yourself after your procedure. Your health care provider may also give you more specific instructions. Your treatment has been planned according to current medical practices, but problems sometimes occur. Call your health care provider if you have any problems or questions after your procedure. What can I expect after the procedure? After your procedure, it is typical to have the following: Bruising at the radial site that usually fades within 1-2 weeks. Blood collecting in the tissue (hematoma) that may be painful to the touch. It should usually decrease in size and tenderness within 1-2 weeks.  Follow these instructions at home: Take medicines only as directed by your health care provider. If you are on a medication called Metformin please do not take for 48 hours after your procedure. Over the next 48hrs please increase your fluid intake of water and non caffeine beverages to flush the contrast dye out of your system.  You may shower 24 hours after the procedure  Leave your bandage on and gently wash the site with plain soap and water. Pat the area dry with a clean towel. Do not rub the site, because this may cause bleeding.  Remove your dressing 48hrs after your procedure and leave open to air.  Do not submerge your site in water for 7 days. This includes swimming and washing dishes.  Check your insertion site every day for redness, swelling, or drainage. Do not apply powder or lotion to the site. Do not flex or bend the affected arm for 24 hours or as directed by your health care provider. Do not push or pull heavy objects with the affected arm for 24 hours or as directed by your health care provider. Do not lift over 10 lb (4.5 kg) for 5 days after your procedure or as directed by your health care provider. Ask your health care provider when it is okay to: Return to  work or school. Resume usual physical activities or sports. Resume sexual activity. Do not drive home if you are discharged the same day as the procedure. Have someone else drive you. You may drive 48 hours after the procedure Do not operate machinery or power tools for 24 hours after the procedure. If your procedure was done as an outpatient procedure, which means that you went home the same day as your procedure, a responsible adult should be with you for the first 24 hours after you arrive home. Keep all follow-up visits as directed by your health care provider. This is important. Contact a health care provider if: You have a fever. You have chills. You have increased bleeding from the radial site. Hold pressure on the site. Get help right away if: You have unusual pain at the radial site. You have redness, warmth, or swelling at the radial site. You have drainage (other than a small amount of blood on the dressing) from the radial site. The radial site is bleeding, and the bleeding does not stop after 15 minutes of holding steady pressure on the site. Your arm or hand becomes pale, cool, tingly, or numb. This information is not intended to replace advice given to you by your health care provider. Make sure you discuss any questions you have with your health care provider. Document Released: 01/16/2011 Document Revised: 05/21/2016 Document Reviewed: 07/02/2014 Elsevier Interactive Patient Education  2018 [employer]  

## 2019-07-07 ENCOUNTER — Encounter: Payer: Self-pay | Admitting: Internal Medicine

## 2019-07-13 ENCOUNTER — Other Ambulatory Visit: Payer: Self-pay | Admitting: Internal Medicine

## 2019-07-13 DIAGNOSIS — Z1231 Encounter for screening mammogram for malignant neoplasm of breast: Secondary | ICD-10-CM

## 2019-08-08 ENCOUNTER — Other Ambulatory Visit: Payer: Self-pay

## 2019-08-08 ENCOUNTER — Ambulatory Visit
Admission: RE | Admit: 2019-08-08 | Discharge: 2019-08-08 | Disposition: A | Discharge status: Home / Self Care | Payer: Medicare Other | Source: Ambulatory Visit | Attending: Internal Medicine | Admitting: Internal Medicine

## 2019-08-08 DIAGNOSIS — Z1231 Encounter for screening mammogram for malignant neoplasm of breast: Secondary | ICD-10-CM | POA: Insufficient documentation

## 2019-11-07 ENCOUNTER — Other Ambulatory Visit: Payer: Self-pay

## 2019-11-07 ENCOUNTER — Encounter: Payer: Self-pay | Admitting: *Deleted

## 2019-11-10 ENCOUNTER — Other Ambulatory Visit: Payer: Self-pay

## 2019-11-10 ENCOUNTER — Other Ambulatory Visit
Admission: RE | Admit: 2019-11-10 | Discharge: 2019-11-10 | Disposition: A | Discharge status: Home / Self Care | Payer: Medicare Other | Source: Ambulatory Visit | Attending: Ophthalmology | Admitting: Ophthalmology

## 2019-11-10 DIAGNOSIS — Z01812 Encounter for preprocedural laboratory examination: Secondary | ICD-10-CM | POA: Diagnosis present

## 2019-11-10 DIAGNOSIS — Z20828 Contact with and (suspected) exposure to other viral communicable diseases: Secondary | ICD-10-CM | POA: Insufficient documentation

## 2019-11-10 LAB — SARS CORONAVIRUS 2 (TAT 6-24 HRS): SARS Coronavirus 2: NEGATIVE

## 2019-11-10 NOTE — Discharge Instructions (Signed)

## 2019-11-13 NOTE — Anesthesia Preprocedure Evaluation (Addendum)
Anesthesia Evaluation  Patient identified by MRN, date of birth, ID band Patient awake    Reviewed: Allergy & Precautions, NPO status , Patient's Chart, lab work & pertinent test results  History of Anesthesia Complications (+) PONV and history of anesthetic complications  Airway Mallampati: IV   Neck ROM: Full    Dental no notable dental hx.    Pulmonary former smoker (quit 1958),    Pulmonary exam normal breath sounds clear to auscultation       Cardiovascular hypertension, Normal cardiovascular exam Rhythm:Regular Rate:Normal     Neuro/Psych PSYCHIATRIC DISORDERS Depression negative neurological ROS     GI/Hepatic GERD  ,  Endo/Other  diabetes, Type 2  Renal/GU negative Renal ROS     Musculoskeletal   Abdominal   Peds  Hematology Breast CA   Anesthesia Other Findings   Reproductive/Obstetrics                            Anesthesia Physical Anesthesia Plan  ASA: II  Anesthesia Plan: MAC   Post-op Pain Management:    Induction: Intravenous  PONV Risk Score and Plan: 3 and TIVA, Midazolam and Treatment may vary due to age or medical condition  Airway Management Planned:   Additional Equipment:   Intra-op Plan:   Post-operative Plan:   Informed Consent: I have reviewed the patients History and Physical, chart, labs and discussed the procedure including the risks, benefits and alternatives for the proposed anesthesia with the patient or authorized representative who has indicated his/her understanding and acceptance.       Plan Discussed with: CRNA  Anesthesia Plan Comments:        Anesthesia Quick Evaluation

## 2019-11-14 ENCOUNTER — Ambulatory Visit
Admission: RE | Admit: 2019-11-14 | Discharge: 2019-11-14 | Disposition: A | Discharge status: Home / Self Care | Payer: Medicare Other | Attending: Ophthalmology | Admitting: Ophthalmology

## 2019-11-14 ENCOUNTER — Other Ambulatory Visit: Payer: Self-pay

## 2019-11-14 ENCOUNTER — Ambulatory Visit: Payer: Medicare Other | Admitting: Anesthesiology

## 2019-11-14 ENCOUNTER — Encounter
Admission: RE | Disposition: A | Discharge status: Home / Self Care | Payer: Self-pay | Source: Home / Self Care | Attending: Ophthalmology

## 2019-11-14 DIAGNOSIS — I10 Essential (primary) hypertension: Secondary | ICD-10-CM | POA: Diagnosis not present

## 2019-11-14 DIAGNOSIS — Z853 Personal history of malignant neoplasm of breast: Secondary | ICD-10-CM | POA: Diagnosis not present

## 2019-11-14 DIAGNOSIS — E1136 Type 2 diabetes mellitus with diabetic cataract: Secondary | ICD-10-CM | POA: Insufficient documentation

## 2019-11-14 DIAGNOSIS — M1990 Unspecified osteoarthritis, unspecified site: Secondary | ICD-10-CM | POA: Insufficient documentation

## 2019-11-14 DIAGNOSIS — H2512 Age-related nuclear cataract, left eye: Secondary | ICD-10-CM | POA: Diagnosis present

## 2019-11-14 HISTORY — DX: Other specified postprocedural states: Z98.890

## 2019-11-14 HISTORY — DX: Nausea with vomiting, unspecified: R11.2

## 2019-11-14 HISTORY — PX: CATARACT EXTRACTION W/PHACO: SHX586

## 2019-11-14 SURGERY — PHACOEMULSIFICATION, CATARACT, WITH IOL INSERTION
Anesthesia: Monitor Anesthesia Care | Site: Eye | Laterality: Left

## 2019-11-14 MED ORDER — MOXIFLOXACIN HCL 0.5 % OP SOLN
OPHTHALMIC | Status: DC | PRN
  Administered 2019-11-14: 0.2 mL via OPHTHALMIC

## 2019-11-14 MED ORDER — ACETAMINOPHEN 325 MG PO TABS: 650.0000 mg | ORAL_TABLET | Freq: Once | ORAL | Status: DC | PRN

## 2019-11-14 MED ORDER — LIDOCAINE HCL (PF) 2 % IJ SOLN
INTRAOCULAR | Status: DC | PRN
  Administered 2019-11-14: 1 mL

## 2019-11-14 MED ORDER — FENTANYL CITRATE (PF) 100 MCG/2ML IJ SOLN
INTRAMUSCULAR | Status: DC | PRN
  Administered 2019-11-14: 25 ug via INTRAVENOUS

## 2019-11-14 MED ORDER — BRIMONIDINE TARTRATE-TIMOLOL 0.2-0.5 % OP SOLN
OPHTHALMIC | Status: DC | PRN
  Administered 2019-11-14: 1 [drp] via OPHTHALMIC

## 2019-11-14 MED ORDER — ACETAMINOPHEN 160 MG/5ML PO SOLN: 325.0000 mg | ORAL | Status: DC | PRN

## 2019-11-14 MED ORDER — TETRACAINE HCL 0.5 % OP SOLN
1.0000 [drp] | OPHTHALMIC | Status: DC | PRN
  Administered 2019-11-14 (×3): 1 [drp] via OPHTHALMIC

## 2019-11-14 MED ORDER — ARMC OPHTHALMIC DILATING DROPS
1.0000 "application " | OPHTHALMIC | Status: DC | PRN
  Administered 2019-11-14 (×3): 1 via OPHTHALMIC

## 2019-11-14 MED ORDER — MIDAZOLAM HCL 2 MG/2ML IJ SOLN
INTRAMUSCULAR | Status: DC | PRN
  Administered 2019-11-14: 0.5 mg via INTRAVENOUS

## 2019-11-14 MED ORDER — EPINEPHRINE PF 1 MG/ML IJ SOLN
INTRAOCULAR | Status: DC | PRN
  Administered 2019-11-14: 71 mL via OPHTHALMIC

## 2019-11-14 MED ORDER — ONDANSETRON HCL 4 MG/2ML IJ SOLN: 4.0000 mg | Freq: Once | INTRAMUSCULAR | Status: DC | PRN

## 2019-11-14 MED ORDER — NA CHONDROIT SULF-NA HYALURON 40-17 MG/ML IO SOLN
INTRAOCULAR | Status: DC | PRN
  Administered 2019-11-14: 1 mL via INTRAOCULAR

## 2019-11-14 SURGICAL SUPPLY — 22 items
CANNULA ANT/CHMB 27G (MISCELLANEOUS) ×2 IMPLANT
CANNULA ANT/CHMB 27GA (MISCELLANEOUS) ×4 IMPLANT
GLOVE SURG LX 8.0 MICRO (GLOVE) ×2
GLOVE SURG LX STRL 8.0 MICRO (GLOVE) ×1 IMPLANT
GLOVE SURG TRIUMPH 8.0 PF LTX (GLOVE) ×2 IMPLANT
GOWN STRL REUS W/ TWL LRG LVL3 (GOWN DISPOSABLE) ×2 IMPLANT
GOWN STRL REUS W/TWL LRG LVL3 (GOWN DISPOSABLE) ×2
LENS IOL TECNIS ITEC 23.0 (Intraocular Lens) ×1 IMPLANT
MARKER SKIN DUAL TIP RULER LAB (MISCELLANEOUS) ×2 IMPLANT
NDL FILTER BLUNT 18X1 1/2 (NEEDLE) ×1 IMPLANT
NDL RETROBULBAR .5 NSTRL (NEEDLE) ×2 IMPLANT
NEEDLE FILTER BLUNT 18X 1/2SAF (NEEDLE) ×1
NEEDLE FILTER BLUNT 18X1 1/2 (NEEDLE) ×1 IMPLANT
PACK EYE AFTER SURG (MISCELLANEOUS) ×2 IMPLANT
PACK OPTHALMIC (MISCELLANEOUS) ×2 IMPLANT
PACK PORFILIO (MISCELLANEOUS) ×2 IMPLANT
SUT ETHILON 10-0 CS-B-6CS-B-6 (SUTURE)
SUTURE EHLN 10-0 CS-B-6CS-B-6 (SUTURE) IMPLANT
SYR 3ML LL SCALE MARK (SYRINGE) ×2 IMPLANT
SYR TB 1ML LUER SLIP (SYRINGE) ×2 IMPLANT
WATER STERILE IRR 250ML POUR (IV SOLUTION) ×2 IMPLANT
WIPE NON LINTING 3.25X3.25 (MISCELLANEOUS) ×2 IMPLANT

## 2019-11-14 NOTE — H&P (Signed)
All labs reviewed. Abnormal studies sent to patients PCP when indicated.  Previous H&P reviewed, patient examined, there are NO CHANGES.  Kelsey Fritzler Porfilio11/17/20207:22 AM

## 2019-11-14 NOTE — Op Note (Addendum)
PREOPERATIVE DIAGNOSIS:  Nuclear sclerotic cataract of the left eye.   POSTOPERATIVE DIAGNOSIS:  Cataract   OPERATIVE PROCEDURE:@   SURGEON:  Birder Robson, MD.   ANESTHESIA:  Anesthesiologist: Darrin Nipper, MD CRNA: Silvana Newness, CRNA  1.      Managed anesthesia care. 2.      0.4ml of Shugarcaine was instilled in the eye following the paracentesis.   COMPLICATIONS:  None.   TECHNIQUE:   Stop and chop   DESCRIPTION OF PROCEDURE:  The patient was examined and consented in the preoperative holding area where the aforementioned topical anesthesia was applied to the left eye and then brought back to the Operating Room where the right eye was prepped and draped in the usual sterile ophthalmic fashion and a lid speculum was placed. A paracentesis was created with the side port blade and the anterior chamber was filled with viscoelastic. A near clear corneal incision was performed with the steel keratome. A continuous curvilinear capsulorrhexis was performed with a cystotome followed by the capsulorrhexis forceps. Hydrodissection and hydrodelineation were carried out with BSS on a blunt cannula. The lens was removed in a stop and chop  technique and the remaining cortical material was removed with the irrigation-aspiration handpiece. The capsular bag was inflated with viscoelastic and the Technis [id number]  lens was placed in the capsular bag without complication. The remaining viscoelastic was removed from the eye with the irrigation-aspiration handpiece. The wounds were hydrated. The anterior chamber was flushed with BSS and the eye was inflated to physiologic pressure. 0.73ml of Vigamox was placed in the anterior chamber. The wounds were found to be water tight. The eye was dressed with Combigan. The patient was given protective glasses to wear throughout the day and a shield with which to sleep tonight. The patient was also given drops with which to begin a drop regimen today and will follow-up  with me in one day. Implant Name Type Inv. Item Serial No. Manufacturer Lot No. LRB No. Used Action  LENS IOL DIOP 23.0 - [id number] Intraocular Lens LENS IOL DIOP 23.0 [id number]  Left 1 Implanted   Procedure(s) with comments: CATARACT EXTRACTION PHACO AND INTRAOCULAR LENS PLACEMENT (IOC) LEFT DIABETIC (Left) - Diabetic - diet controlled  Electronically signed: Birder Robson 11/14/2019 7:55 AM

## 2019-11-14 NOTE — Transfer of Care (Signed)
Immediate Anesthesia Transfer of Care Note  Patient: Kelsey Haynes  Procedure(s) Performed: CATARACT EXTRACTION PHACO AND INTRAOCULAR LENS PLACEMENT (IOC) LEFT DIABETIC (Left Eye)  Patient Location: PACU  Anesthesia Type: MAC  Level of Consciousness: awake, alert  and patient cooperative  Airway and Oxygen Therapy: Patient Spontanous Breathing and Patient connected to supplemental oxygen  Post-op Assessment: Post-op Vital signs reviewed, Patient's Cardiovascular Status Stable, Respiratory Function Stable, Patent Airway and No signs of Nausea or vomiting  Post-op Vital Signs: Reviewed and stable  Complications: No apparent anesthesia complications

## 2019-11-14 NOTE — Anesthesia Postprocedure Evaluation (Signed)
Anesthesia Post Note  Patient: Dispensing optician  Procedure(s) Performed: CATARACT EXTRACTION PHACO AND INTRAOCULAR LENS PLACEMENT (IOC) LEFT DIABETIC (Left Eye)     Patient location during evaluation: PACU Anesthesia Type: MAC Level of consciousness: awake and alert, oriented and patient cooperative Pain management: pain level controlled Vital Signs Assessment: post-procedure vital signs reviewed and stable Respiratory status: spontaneous breathing, nonlabored ventilation and respiratory function stable Cardiovascular status: blood pressure returned to baseline and stable Postop Assessment: adequate PO intake Anesthetic complications: no    Darrin Nipper

## 2019-11-15 ENCOUNTER — Encounter: Payer: Self-pay | Admitting: Ophthalmology

## 2019-11-28 ENCOUNTER — Encounter: Payer: Self-pay | Admitting: *Deleted

## 2019-11-28 ENCOUNTER — Other Ambulatory Visit: Payer: Self-pay

## 2019-12-01 ENCOUNTER — Other Ambulatory Visit
Admission: RE | Admit: 2019-12-01 | Discharge: 2019-12-01 | Disposition: A | Discharge status: Home / Self Care | Payer: Medicare Other | Source: Ambulatory Visit | Attending: Ophthalmology | Admitting: Ophthalmology

## 2019-12-01 DIAGNOSIS — Z20828 Contact with and (suspected) exposure to other viral communicable diseases: Secondary | ICD-10-CM | POA: Insufficient documentation

## 2019-12-01 DIAGNOSIS — Z01812 Encounter for preprocedural laboratory examination: Secondary | ICD-10-CM | POA: Diagnosis present

## 2019-12-01 LAB — SARS CORONAVIRUS 2 (TAT 6-24 HRS): SARS Coronavirus 2: NEGATIVE

## 2019-12-04 NOTE — Discharge Instructions (Signed)

## 2019-12-04 NOTE — Anesthesia Preprocedure Evaluation (Addendum)
Anesthesia Evaluation  Patient identified by MRN, date of birth, ID band Patient awake    Reviewed: Allergy & Precautions, NPO status , Patient's Chart, lab work & pertinent test results  History of Anesthesia Complications (+) PONV and history of anesthetic complications  Airway Mallampati: IV   Neck ROM: Full    Dental no notable dental hx.    Pulmonary former smoker,    Pulmonary exam normal breath sounds clear to auscultation       Cardiovascular hypertension, Normal cardiovascular exam Rhythm:Regular Rate:Normal     Neuro/Psych PSYCHIATRIC DISORDERS Depression negative neurological ROS     GI/Hepatic GERD  ,  Endo/Other  diabetes, Type 2  Renal/GU negative Renal ROS     Musculoskeletal   Abdominal   Peds  Hematology Breast CA   Anesthesia Other Findings   Reproductive/Obstetrics                             Anesthesia Physical  Anesthesia Plan  ASA: II  Anesthesia Plan: MAC   Post-op Pain Management:    Induction: Intravenous  PONV Risk Score and Plan: 3 and TIVA, Midazolam and Treatment may vary due to age or medical condition  Airway Management Planned:   Additional Equipment:   Intra-op Plan:   Post-operative Plan:   Informed Consent: I have reviewed the patients History and Physical, chart, labs and discussed the procedure including the risks, benefits and alternatives for the proposed anesthesia with the patient or authorized representative who has indicated his/her understanding and acceptance.       Plan Discussed with: CRNA  Anesthesia Plan Comments:        Anesthesia Quick Evaluation

## 2019-12-05 ENCOUNTER — Ambulatory Visit
Admission: RE | Admit: 2019-12-05 | Discharge: 2019-12-05 | Disposition: A | Discharge status: Home / Self Care | Payer: Medicare Other | Attending: Ophthalmology | Admitting: Ophthalmology

## 2019-12-05 ENCOUNTER — Ambulatory Visit: Payer: Medicare Other | Admitting: Anesthesiology

## 2019-12-05 ENCOUNTER — Other Ambulatory Visit: Payer: Self-pay

## 2019-12-05 ENCOUNTER — Encounter
Admission: RE | Disposition: A | Discharge status: Home / Self Care | Payer: Self-pay | Source: Home / Self Care | Attending: Ophthalmology

## 2019-12-05 DIAGNOSIS — M1990 Unspecified osteoarthritis, unspecified site: Secondary | ICD-10-CM | POA: Diagnosis not present

## 2019-12-05 DIAGNOSIS — Z90710 Acquired absence of both cervix and uterus: Secondary | ICD-10-CM | POA: Insufficient documentation

## 2019-12-05 DIAGNOSIS — Z9842 Cataract extraction status, left eye: Secondary | ICD-10-CM | POA: Diagnosis not present

## 2019-12-05 DIAGNOSIS — E1136 Type 2 diabetes mellitus with diabetic cataract: Secondary | ICD-10-CM | POA: Diagnosis not present

## 2019-12-05 DIAGNOSIS — I10 Essential (primary) hypertension: Secondary | ICD-10-CM | POA: Insufficient documentation

## 2019-12-05 DIAGNOSIS — H2511 Age-related nuclear cataract, right eye: Secondary | ICD-10-CM | POA: Insufficient documentation

## 2019-12-05 DIAGNOSIS — Z853 Personal history of malignant neoplasm of breast: Secondary | ICD-10-CM | POA: Insufficient documentation

## 2019-12-05 DIAGNOSIS — F329 Major depressive disorder, single episode, unspecified: Secondary | ICD-10-CM | POA: Insufficient documentation

## 2019-12-05 DIAGNOSIS — Z87891 Personal history of nicotine dependence: Secondary | ICD-10-CM | POA: Diagnosis not present

## 2019-12-05 DIAGNOSIS — K219 Gastro-esophageal reflux disease without esophagitis: Secondary | ICD-10-CM | POA: Diagnosis not present

## 2019-12-05 DIAGNOSIS — R011 Cardiac murmur, unspecified: Secondary | ICD-10-CM | POA: Diagnosis not present

## 2019-12-05 HISTORY — PX: CATARACT EXTRACTION W/PHACO: SHX586

## 2019-12-05 SURGERY — PHACOEMULSIFICATION, CATARACT, WITH IOL INSERTION
Anesthesia: Monitor Anesthesia Care | Site: Eye | Laterality: Right

## 2019-12-05 MED ORDER — ONDANSETRON HCL 4 MG/2ML IJ SOLN: 4.0000 mg | Freq: Once | INTRAMUSCULAR | Status: DC | PRN

## 2019-12-05 MED ORDER — NA CHONDROIT SULF-NA HYALURON 40-17 MG/ML IO SOLN
INTRAOCULAR | Status: DC | PRN
  Administered 2019-12-05: 1 mL via INTRAOCULAR

## 2019-12-05 MED ORDER — ARMC OPHTHALMIC DILATING DROPS
1.0000 "application " | OPHTHALMIC | Status: DC | PRN
  Administered 2019-12-05 (×3): 1 via OPHTHALMIC

## 2019-12-05 MED ORDER — MIDAZOLAM HCL 2 MG/2ML IJ SOLN
INTRAMUSCULAR | Status: DC | PRN
  Administered 2019-12-05: 0.5 mg via INTRAVENOUS

## 2019-12-05 MED ORDER — LIDOCAINE HCL (PF) 2 % IJ SOLN
INTRAOCULAR | Status: DC | PRN
  Administered 2019-12-05: 2 mL

## 2019-12-05 MED ORDER — ACETAMINOPHEN 325 MG PO TABS
650.0000 mg | ORAL_TABLET | Freq: Once | ORAL | Status: AC | PRN
  Administered 2019-12-05: 650 mg via ORAL

## 2019-12-05 MED ORDER — BRIMONIDINE TARTRATE-TIMOLOL 0.2-0.5 % OP SOLN
OPHTHALMIC | Status: DC | PRN
  Administered 2019-12-05: 1 [drp] via OPHTHALMIC

## 2019-12-05 MED ORDER — EPINEPHRINE PF 1 MG/ML IJ SOLN
INTRAOCULAR | Status: DC | PRN
  Administered 2019-12-05: 61 mL via OPHTHALMIC

## 2019-12-05 MED ORDER — FENTANYL CITRATE (PF) 100 MCG/2ML IJ SOLN
INTRAMUSCULAR | Status: DC | PRN
  Administered 2019-12-05: 25 ug via INTRAVENOUS

## 2019-12-05 MED ORDER — ACETAMINOPHEN 160 MG/5ML PO SOLN: 325.0000 mg | ORAL | Status: AC | PRN

## 2019-12-05 MED ORDER — TETRACAINE HCL 0.5 % OP SOLN
1.0000 [drp] | OPHTHALMIC | Status: DC | PRN
  Administered 2019-12-05 (×3): 1 [drp] via OPHTHALMIC

## 2019-12-05 MED ORDER — MOXIFLOXACIN HCL 0.5 % OP SOLN
OPHTHALMIC | Status: DC | PRN
  Administered 2019-12-05: 0.2 mL via OPHTHALMIC

## 2019-12-05 SURGICAL SUPPLY — 20 items
CANNULA ANT/CHMB 27G (MISCELLANEOUS) ×2 IMPLANT
CANNULA ANT/CHMB 27GA (MISCELLANEOUS) ×4 IMPLANT
GLOVE SURG LX 8.0 MICRO (GLOVE) ×1
GLOVE SURG LX STRL 8.0 MICRO (GLOVE) ×1 IMPLANT
GLOVE SURG TRIUMPH 8.0 PF LTX (GLOVE) ×2 IMPLANT
GOWN STRL REUS W/ TWL LRG LVL3 (GOWN DISPOSABLE) ×2 IMPLANT
GOWN STRL REUS W/TWL LRG LVL3 (GOWN DISPOSABLE) ×2
LENS IOL TECNIS ITEC 23.0 (Intraocular Lens) ×1 IMPLANT
MARKER SKIN DUAL TIP RULER LAB (MISCELLANEOUS) ×2 IMPLANT
NDL FILTER BLUNT 18X1 1/2 (NEEDLE) ×1 IMPLANT
NDL RETROBULBAR .5 NSTRL (NEEDLE) ×2 IMPLANT
NEEDLE FILTER BLUNT 18X 1/2SAF (NEEDLE) ×1
NEEDLE FILTER BLUNT 18X1 1/2 (NEEDLE) ×1 IMPLANT
PACK EYE AFTER SURG (MISCELLANEOUS) ×2 IMPLANT
PACK OPTHALMIC (MISCELLANEOUS) ×2 IMPLANT
PACK PORFILIO (MISCELLANEOUS) ×2 IMPLANT
SYR 3ML LL SCALE MARK (SYRINGE) ×2 IMPLANT
SYR TB 1ML LUER SLIP (SYRINGE) ×2 IMPLANT
WATER STERILE IRR 250ML POUR (IV SOLUTION) ×2 IMPLANT
WIPE NON LINTING 3.25X3.25 (MISCELLANEOUS) ×2 IMPLANT

## 2019-12-05 NOTE — H&P (Signed)
All labs reviewed. Abnormal studies sent to patients PCP when indicated.  Previous H&P reviewed, patient examined, there are NO CHANGES.  Kelsey Vine Porfilio12/8/20207:21 AM

## 2019-12-05 NOTE — Anesthesia Procedure Notes (Signed)
Procedure Name: MAC Date/Time: 12/05/2019 7:36 AM Performed by: Georga Bora, CRNA Pre-anesthesia Checklist: Patient identified, Emergency Drugs available, Suction available, Patient being monitored and Timeout performed Patient Re-evaluated:Patient Re-evaluated prior to induction Oxygen Delivery Method: Nasal cannula

## 2019-12-05 NOTE — Op Note (Signed)
PREOPERATIVE DIAGNOSIS:  Nuclear sclerotic cataract of the right eye.   POSTOPERATIVE DIAGNOSIS:  H25.11 Cataract   OPERATIVE PROCEDURE:@   SURGEON:  Birder Robson, MD.   ANESTHESIA:  Anesthesiologist: Darrin Nipper, MD CRNA: Georga Bora, CRNA; Cameron Ali, CRNA  1.      Managed anesthesia care. 2.      0.19ml of Shugarcaine was instilled in the eye following the paracentesis.   COMPLICATIONS:  None.   TECHNIQUE:   Stop and chop   DESCRIPTION OF PROCEDURE:  The patient was examined and consented in the preoperative holding area where the aforementioned topical anesthesia was applied to the right eye and then brought back to the Operating Room where the right eye was prepped and draped in the usual sterile ophthalmic fashion and a lid speculum was placed. A paracentesis was created with the side port blade and the anterior chamber was filled with viscoelastic. A near clear corneal incision was performed with the steel keratome. A continuous curvilinear capsulorrhexis was performed with a cystotome followed by the capsulorrhexis forceps. Hydrodissection and hydrodelineation were carried out with BSS on a blunt cannula. The lens was removed in a stop and chop  technique and the remaining cortical material was removed with the irrigation-aspiration handpiece. The capsular bag was inflated with viscoelastic and the Technis [id number]  lens was placed in the capsular bag without complication. The remaining viscoelastic was removed from the eye with the irrigation-aspiration handpiece. The wounds were hydrated. The anterior chamber was flushed with BSS and the eye was inflated to physiologic pressure. 0.3ml of Vigamox was placed in the anterior chamber. The wounds were found to be water tight. The eye was dressed with Combigan. The patient was given protective glasses to wear throughout the day and a shield with which to sleep tonight. The patient was also given drops with which to begin a drop  regimen today and will follow-up with me in one day. Implant Name Type Inv. Item Serial No. Manufacturer Lot No. LRB No. Used Action  LENS IOL DIOP 23.0 - [id number] Intraocular Lens LENS IOL DIOP 23.0 [id number]  Right 1 Implanted   Procedure(s): CATARACT EXTRACTION PHACO AND INTRAOCULAR LENS PLACEMENT (IOC) RIGHT DIABETIC 7.58, 00:52.3 (Right)  Electronically signed: Birder Robson 12/05/2019 8:03 AM

## 2019-12-05 NOTE — Transfer of Care (Signed)
Immediate Anesthesia Transfer of Care Note  Patient: Kelsey Haynes  Procedure(s) Performed: CATARACT EXTRACTION PHACO AND INTRAOCULAR LENS PLACEMENT ([facility]) RIGHT DIABETIC 7.58, 00:52.3 (Right Eye)  Patient Location: PACU  Anesthesia Type: MAC  Level of Consciousness: awake, alert  and patient cooperative  Airway and Oxygen Therapy: Patient Spontanous Breathing and Patient connected to supplemental oxygen  Post-op Assessment: Post-op Vital signs reviewed, Patient's Cardiovascular Status Stable, Respiratory Function Stable, Patent Airway and No signs of Nausea or vomiting  Post-op Vital Signs: Reviewed and stable  Complications: No apparent anesthesia complications

## 2019-12-05 NOTE — Anesthesia Postprocedure Evaluation (Signed)
Anesthesia Post Note  Patient: Dispensing optician  Procedure(s) Performed: CATARACT EXTRACTION PHACO AND INTRAOCULAR LENS PLACEMENT ([facility]) RIGHT DIABETIC 7.58, 00:52.3 (Right Eye)     Patient location during evaluation: PACU Anesthesia Type: MAC Level of consciousness: awake and alert, oriented and patient cooperative Pain management: pain level controlled Vital Signs Assessment: post-procedure vital signs reviewed and stable Respiratory status: spontaneous breathing, nonlabored ventilation and respiratory function stable Cardiovascular status: blood pressure returned to baseline and stable Postop Assessment: adequate PO intake Anesthetic complications: no    Darrin Nipper

## 2019-12-06 ENCOUNTER — Encounter: Payer: Self-pay | Admitting: Ophthalmology

## 2020-05-10 ENCOUNTER — Emergency Department: Payer: Medicare Other

## 2020-05-10 ENCOUNTER — Encounter: Payer: Self-pay | Admitting: Emergency Medicine

## 2020-05-10 ENCOUNTER — Other Ambulatory Visit: Payer: Self-pay

## 2020-05-10 ENCOUNTER — Emergency Department
Admission: EM | Admit: 2020-05-10 | Discharge: 2020-05-10 | Disposition: A | Discharge status: Home / Self Care | Payer: Medicare Other | Attending: Emergency Medicine | Admitting: Emergency Medicine

## 2020-05-10 DIAGNOSIS — Z79899 Other long term (current) drug therapy: Secondary | ICD-10-CM | POA: Insufficient documentation

## 2020-05-10 DIAGNOSIS — I10 Essential (primary) hypertension: Secondary | ICD-10-CM | POA: Diagnosis not present

## 2020-05-10 DIAGNOSIS — Z853 Personal history of malignant neoplasm of breast: Secondary | ICD-10-CM | POA: Insufficient documentation

## 2020-05-10 DIAGNOSIS — E119 Type 2 diabetes mellitus without complications: Secondary | ICD-10-CM | POA: Insufficient documentation

## 2020-05-10 DIAGNOSIS — Z87891 Personal history of nicotine dependence: Secondary | ICD-10-CM | POA: Insufficient documentation

## 2020-05-10 DIAGNOSIS — Z923 Personal history of irradiation: Secondary | ICD-10-CM | POA: Diagnosis not present

## 2020-05-10 DIAGNOSIS — R0789 Other chest pain: Secondary | ICD-10-CM

## 2020-05-10 LAB — BASIC METABOLIC PANEL
Anion gap: 11 (ref 5–15)
BUN: 29 mg/dL — ABNORMAL HIGH (ref 8–23)
CO2: 26 mmol/L (ref 22–32)
Calcium: 9 mg/dL (ref 8.9–10.3)
Chloride: 101 mmol/L (ref 98–111)
Creatinine, Ser: 1.18 mg/dL — ABNORMAL HIGH (ref 0.44–1.00)
GFR calc Af Amer: 48 mL/min — ABNORMAL LOW (ref >60)
GFR calc non Af Amer: 42 mL/min — ABNORMAL LOW (ref >60)
Glucose, Bld: 155 mg/dL — ABNORMAL HIGH (ref 70–99)
Potassium: 3.4 mmol/L — ABNORMAL LOW (ref 3.5–5.1)
Sodium: 138 mmol/L (ref 135–145)

## 2020-05-10 LAB — TROPONIN I (HIGH SENSITIVITY)
Troponin I (High Sensitivity): 10 ng/L
Troponin I (High Sensitivity): 10 ng/L

## 2020-05-10 LAB — CBC
HCT: 39.3 % (ref 36.0–46.0)
Hemoglobin: 13.3 g/dL (ref 12.0–15.0)
MCH: 29.4 pg (ref 26.0–34.0)
MCHC: 33.8 g/dL (ref 30.0–36.0)
MCV: 86.9 fL (ref 80.0–100.0)
Platelets: 167 10*3/uL (ref 150–400)
RBC: 4.52 MIL/uL (ref 3.87–5.11)
RDW: 12.7 % (ref 11.5–15.5)
WBC: 9 10*3/uL (ref 4.0–10.5)
nRBC: 0 % (ref 0.0–0.2)

## 2020-05-10 MED ORDER — SODIUM CHLORIDE 0.9% FLUSH: 3.0000 mL | Freq: Once | INTRAVENOUS | Status: DC

## 2020-05-10 NOTE — ED Triage Notes (Signed)
Pt presents to ED via POV with c/o intermittent CP. Pt states was evaluated by EMS earlier today but did not come to hopsital. Pt states CP started under 1 breast then moved to the other then went away. Pt then states she was being seen by PT and "that number was over 100 and that's why I came". This RN able to clarify that patient meant the systolic on her BP. Pt denies active CP at this time. Pt currently wearing halter monitor at this time to monitor heart. Pt visualized in NAD at this time. VSS.

## 2020-05-10 NOTE — ED Provider Notes (Addendum)
[facility] EMERGENCY DEPARTMENT Provider Note   CSN: [id number] Arrival date & time: 05/10/20  1142     History Chief Complaint  Patient presents with  . Chest Pain    Kelsey Haynes is a 86 y.o. female history of diabetes, depression, hypertension, here presenting with chest pain. Patient had chest pressure earlier today.  Patient is eating a lot of salt recently and has been eating fried food. States that the pain lasts about an hour.  It was initially left-sided and then radiates to the right side and then is completely gone. She called an EMT and had a EKG that was normal.  She then called her doctor and was told to come here to rule out a heart attack.  Of note patient saw Dr. Clayborn Bigness, her cardiologist, yesterday for tachycardia.  Patient had a normal cath about a year ago. She had bradycardia before and her heart rate was 80 yesterday so she was not put on any beta-blockers.  She states that she is feeling fine right now.  The history is provided by the patient.       Past Medical History:  Diagnosis Date  . Breast cancer ([place]) right breast  . Cancer ([place]) 2006   breast cancer 2006  . Depression   . Diabetes mellitus without complication ([place])   . Hypertension   . Personal history of radiation therapy   . PONV (postoperative nausea and vomiting)     There are no problems to display for this patient.   Past Surgical History:  Procedure Laterality Date  . ABDOMINAL HYSTERECTOMY    . BREAST BIOPSY Right 2006   +  . BREAST CYST ASPIRATION Right   . CATARACT EXTRACTION W/PHACO Left 11/14/2019   Procedure: CATARACT EXTRACTION PHACO AND INTRAOCULAR LENS PLACEMENT ([address]) LEFT DIABETIC;  Surgeon: Birder Robson, MD;  Location: [facility];  Service: Ophthalmology;  Laterality: Left;  [place] 1:18 CDE 14.93  . CATARACT EXTRACTION W/PHACO Right 12/05/2019   Procedure: CATARACT EXTRACTION PHACO AND INTRAOCULAR LENS PLACEMENT ([facility]) RIGHT DIABETIC  7.58, 00:52.3;  Surgeon: Birder Robson, MD;  Location: [facility];  Service: Ophthalmology;  Laterality: Right;  . endoscopic carpal tunnel release    . HALLUX VALGUS CORRECTION Left 1983  . HERNIA REPAIR     x2  . left foot surgery Left   . LEFT HEART CATH AND CORONARY ANGIOGRAPHY Left 07/06/2019   Procedure: LEFT HEART CATH AND CORONARY ANGIOGRAPHY;  Surgeon: Yolonda Kida, MD;  Location: [facility];  Service: Cardiovascular;  Laterality: Left;  . SHOULDER SURGERY Bilateral   . SPINE SURGERY     back     OB History   No obstetric history on file.     Family History  Problem Relation Age of Onset  . Breast cancer Neg Hx     Social History   Tobacco Use  . Smoking status: Former Smoker    Packs/day: 0.10    Types: Cigarettes    Quit date: 12/28/1956    Years since quitting: 63.5  . Smokeless tobacco: Never Used  . Tobacco comment: smoked in college  Vaping Use  . Vaping Use: Never used  Substance Use Topics  . Alcohol use: Yes    Comment: 1 drink per month  . Drug use: Not on file    Home Medications Prior to Admission medications   Medication Sig Start Date End Date Taking? Authorizing Provider  acetaminophen (TYLENOL) 500 MG tablet Take 500 mg  by mouth every 6 (six) hours as needed for moderate pain or headache.    [provider]  diclofenac sodium (VOLTAREN) 1 % GEL Apply 2 g topically 2 (two) times a day.  05/04/18   [provider]  Multiple Vitamin (MULTI-VITAMINS) TABS Take 1 tablet by mouth daily.     [provider]  Multiple Vitamins-Minerals (HAIR SKIN AND NAILS FORMULA) TABS Take 2 each by mouth daily.     [provider]  Phenazopyridine HCl (AZO TABS PO) Take by mouth at bedtime as needed.    [provider]  predniSONE (DELTASONE) 20 MG tablet Take 20 mg by mouth daily as needed (Take 0.5 - 1 tab for foot pain).    [provider]    Allergies    Other  Review of Systems    Review of Systems  Cardiovascular: Positive for chest pain.  All other systems reviewed and are negative.   Physical Exam Updated Vital Signs BP 121/76   Pulse 85   Temp 98 F (36.7 C)   Resp 18   Ht 4\' 11"  (1.499 m)   Wt 72.6 kg   SpO2 97%   BMI 32.32 kg/m   Physical Exam Vitals and nursing note reviewed.  Constitutional:      Appearance: She is well-developed.     Comments: Eating zaxby's   HENT:     Head: Normocephalic.  Eyes:     Extraocular Movements: Extraocular movements intact.     Pupils: Pupils are equal, round, and reactive to light.  Cardiovascular:     Rate and Rhythm: Normal rate and regular rhythm.     Heart sounds: Normal heart sounds.  Pulmonary:     Effort: Pulmonary effort is normal.     Breath sounds: Normal breath sounds.  Abdominal:     General: Bowel sounds are normal.     Palpations: Abdomen is soft.  Musculoskeletal:        General: Normal range of motion.     Cervical back: Normal range of motion.  Skin:    General: Skin is warm.     Capillary Refill: Capillary refill takes less than 2 seconds.  Neurological:     General: No focal deficit present.     Mental Status: She is alert.  Psychiatric:        Mood and Affect: Mood normal.     ED Results / Procedures / Treatments   Labs (all labs ordered are listed, but only abnormal results are displayed) Labs Reviewed  BASIC METABOLIC PANEL - Abnormal; Notable for the following components:      Result Value   Potassium 3.4 (*)    Glucose, Bld 155 (*)    BUN 29 (*)    Creatinine, Ser 1.18 (*)    GFR calc non Af Amer 42 (*)    GFR calc Af Amer 48 (*)    All other components within normal limits  CBC  TROPONIN I (HIGH SENSITIVITY)  TROPONIN I (HIGH SENSITIVITY)    EKG EKG Interpretation  Date/Time:  Friday May 10 2020 11:46:12 EDT Ventricular Rate:  80 PR Interval:  130 QRS Duration: 82 QT Interval:  366 QTC Calculation: 422 R Axis:   -24 Text Interpretation: Sinus rhythm  with Premature atrial complexes in a pattern of bigeminy Possible Left atrial enlargement Minimal voltage criteria for LVH, may be normal variant ( R in aVL ) Nonspecific ST and T wave abnormality Abnormal ECG Confirmed by UNCONFIRMED, DOCTOR ([id number]),  editor Mel Almond, [place] [phone]) on 05/10/2020 12:09:19 PM Also confirmed by Wandra Arthurs [phone])  on 06/18/2020 9:11:53 AM   Radiology No results found.  Procedures Procedures (including critical care time)  Medications Ordered in ED Medications - No data to display  ED Course  I have reviewed the triage vital signs and the nursing notes.  Pertinent labs & imaging results that were available during my care of the patient were reviewed by me and considered in my medical decision making (see chart for details).    MDM Rules/Calculators/A&P                      Kelsey Haynes is a 86 y.o. female here presenting with chest pain. Chest pain earlier today.  Patient had a normal cath last year.  Patient has been eating salty food and her blood pressure is elevated.  She has been compliant with her blood pressure medicine.  Her troponin is negative x2, her chest x-ray is normal and her labs are otherwise unremarkable.  I think she may have symptomatic hypertension from eating too much salt.  Told her to cut down her salt intake and take her blood pressure medicines as prescribed.  She can follow-up with her cardiologist, Dr. Clayborn Bigness, outpatient.   Final Clinical Impression(s) / ED Diagnoses Final diagnoses:  Other chest pain  Essential hypertension    Rx / DC Orders ED Discharge Orders    None       Drenda Freeze, MD 05/10/20 1621    Drenda Freeze, MD 06/18/20 [phone]

## 2020-05-10 NOTE — ED Notes (Signed)
Esign not working pt verbalize discharge instructions and has no questions at this time

## 2020-05-10 NOTE — Discharge Instructions (Signed)
Blood pressure is elevated likely from your salt intake.  Please decrease your salt intake.  Take your blood pressure medicine as prescribed.  We obtain labs today and you do not have a heart attack currently.  Follow-up with your cardiologist, Dr. Clayborn Bigness, next week   Return to ER if you have worse chest pain, trouble breathing.

## 2020-05-15 ENCOUNTER — Other Ambulatory Visit: Payer: Self-pay | Admitting: Nurse Practitioner

## 2020-05-15 DIAGNOSIS — Z981 Arthrodesis status: Secondary | ICD-10-CM

## 2020-05-15 DIAGNOSIS — G608 Other hereditary and idiopathic neuropathies: Secondary | ICD-10-CM

## 2020-05-31 ENCOUNTER — Ambulatory Visit
Admission: RE | Admit: 2020-05-31 | Discharge: 2020-05-31 | Disposition: A | Discharge status: Home / Self Care | Payer: Medicare Other | Source: Ambulatory Visit | Attending: Nurse Practitioner | Admitting: Nurse Practitioner

## 2020-05-31 ENCOUNTER — Other Ambulatory Visit: Payer: Self-pay

## 2020-05-31 DIAGNOSIS — Z981 Arthrodesis status: Secondary | ICD-10-CM

## 2020-05-31 DIAGNOSIS — G608 Other hereditary and idiopathic neuropathies: Secondary | ICD-10-CM | POA: Insufficient documentation

## 2020-09-11 ENCOUNTER — Other Ambulatory Visit: Payer: Self-pay | Admitting: Internal Medicine

## 2020-09-11 DIAGNOSIS — Z1231 Encounter for screening mammogram for malignant neoplasm of breast: Secondary | ICD-10-CM

## 2020-10-01 ENCOUNTER — Ambulatory Visit: Payer: Medicare Other

## 2020-10-06 ENCOUNTER — Emergency Department: Payer: Medicare Other

## 2020-10-06 ENCOUNTER — Other Ambulatory Visit: Payer: Self-pay

## 2020-10-06 ENCOUNTER — Inpatient Hospital Stay
Admission: EM | Admit: 2020-10-06 | Discharge: 2020-10-10 | DRG: 185 | Disposition: A | Discharge status: Skilled Nursing Facility | Payer: Medicare Other | Attending: Internal Medicine | Admitting: Internal Medicine

## 2020-10-06 DIAGNOSIS — R079 Chest pain, unspecified: Secondary | ICD-10-CM | POA: Diagnosis not present

## 2020-10-06 DIAGNOSIS — E669 Obesity, unspecified: Secondary | ICD-10-CM | POA: Diagnosis present

## 2020-10-06 DIAGNOSIS — Z923 Personal history of irradiation: Secondary | ICD-10-CM

## 2020-10-06 DIAGNOSIS — S2249XA Multiple fractures of ribs, unspecified side, initial encounter for closed fracture: Secondary | ICD-10-CM | POA: Diagnosis not present

## 2020-10-06 DIAGNOSIS — E1142 Type 2 diabetes mellitus with diabetic polyneuropathy: Secondary | ICD-10-CM | POA: Diagnosis present

## 2020-10-06 DIAGNOSIS — Z87891 Personal history of nicotine dependence: Secondary | ICD-10-CM

## 2020-10-06 DIAGNOSIS — Z7982 Long term (current) use of aspirin: Secondary | ICD-10-CM

## 2020-10-06 DIAGNOSIS — I10 Essential (primary) hypertension: Secondary | ICD-10-CM | POA: Diagnosis not present

## 2020-10-06 DIAGNOSIS — Z91018 Allergy to other foods: Secondary | ICD-10-CM

## 2020-10-06 DIAGNOSIS — Y92009 Unspecified place in unspecified non-institutional (private) residence as the place of occurrence of the external cause: Secondary | ICD-10-CM

## 2020-10-06 DIAGNOSIS — Z90710 Acquired absence of both cervix and uterus: Secondary | ICD-10-CM

## 2020-10-06 DIAGNOSIS — Z79899 Other long term (current) drug therapy: Secondary | ICD-10-CM

## 2020-10-06 DIAGNOSIS — W19XXXA Unspecified fall, initial encounter: Secondary | ICD-10-CM | POA: Diagnosis not present

## 2020-10-06 DIAGNOSIS — R410 Disorientation, unspecified: Secondary | ICD-10-CM | POA: Diagnosis present

## 2020-10-06 DIAGNOSIS — W010XXA Fall on same level from slipping, tripping and stumbling without subsequent striking against object, initial encounter: Secondary | ICD-10-CM | POA: Diagnosis present

## 2020-10-06 DIAGNOSIS — Z6830 Body mass index (BMI) 30.0-30.9, adult: Secondary | ICD-10-CM

## 2020-10-06 DIAGNOSIS — I959 Hypotension, unspecified: Secondary | ICD-10-CM | POA: Diagnosis not present

## 2020-10-06 DIAGNOSIS — Z853 Personal history of malignant neoplasm of breast: Secondary | ICD-10-CM

## 2020-10-06 DIAGNOSIS — K219 Gastro-esophageal reflux disease without esophagitis: Secondary | ICD-10-CM | POA: Diagnosis present

## 2020-10-06 DIAGNOSIS — Z20822 Contact with and (suspected) exposure to covid-19: Secondary | ICD-10-CM | POA: Diagnosis present

## 2020-10-06 DIAGNOSIS — F32A Depression, unspecified: Secondary | ICD-10-CM | POA: Diagnosis present

## 2020-10-06 LAB — CBC WITH DIFFERENTIAL/PLATELET
Abs Immature Granulocytes: 0.03 10*3/uL (ref 0.00–0.07)
Basophils Absolute: 0 10*3/uL (ref 0.0–0.1)
Basophils Relative: 0 %
Eosinophils Absolute: 0.2 10*3/uL (ref 0.0–0.5)
Eosinophils Relative: 2 %
HCT: 39.9 % (ref 36.0–46.0)
Hemoglobin: 13.4 g/dL (ref 12.0–15.0)
Immature Granulocytes: 0 %
Lymphocytes Relative: 12 %
Lymphs Abs: 1.1 10*3/uL (ref 0.7–4.0)
MCH: 29.2 pg (ref 26.0–34.0)
MCHC: 33.6 g/dL (ref 30.0–36.0)
MCV: 86.9 fL (ref 80.0–100.0)
Monocytes Absolute: 0.6 10*3/uL (ref 0.1–1.0)
Monocytes Relative: 6 %
Neutro Abs: 7.6 10*3/uL (ref 1.7–7.7)
Neutrophils Relative %: 80 %
Platelets: 157 10*3/uL (ref 150–400)
RBC: 4.59 MIL/uL (ref 3.87–5.11)
RDW: 12.4 % (ref 11.5–15.5)
WBC: 9.5 10*3/uL (ref 4.0–10.5)
nRBC: 0 % (ref 0.0–0.2)

## 2020-10-06 LAB — URINALYSIS, COMPLETE (UACMP) WITH MICROSCOPIC
Bilirubin Urine: NEGATIVE
Glucose, UA: NEGATIVE mg/dL
Hgb urine dipstick: NEGATIVE
Ketones, ur: NEGATIVE mg/dL
Leukocytes,Ua: NEGATIVE
Nitrite: NEGATIVE
Protein, ur: NEGATIVE mg/dL
Specific Gravity, Urine: 1.012 (ref 1.005–1.030)
pH: 6 (ref 5.0–8.0)

## 2020-10-06 LAB — BASIC METABOLIC PANEL
Anion gap: 13 (ref 5–15)
BUN: 22 mg/dL (ref 8–23)
CO2: 23 mmol/L (ref 22–32)
Calcium: 8.8 mg/dL — ABNORMAL LOW (ref 8.9–10.3)
Chloride: 102 mmol/L (ref 98–111)
Creatinine, Ser: 1.03 mg/dL — ABNORMAL HIGH (ref 0.44–1.00)
GFR, Estimated: 49 mL/min — ABNORMAL LOW (ref >60)
Glucose, Bld: 198 mg/dL — ABNORMAL HIGH (ref 70–99)
Potassium: 4.6 mmol/L (ref 3.5–5.1)
Sodium: 138 mmol/L (ref 135–145)

## 2020-10-06 LAB — RESPIRATORY PANEL BY RT PCR (FLU A&B, COVID)
Influenza A by PCR: NEGATIVE
Influenza B by PCR: NEGATIVE
SARS Coronavirus 2 by RT PCR: NEGATIVE

## 2020-10-06 MED ORDER — TRAMADOL HCL 50 MG PO TABS
100.0000 mg | ORAL_TABLET | Freq: Once | ORAL | Status: AC
  Administered 2020-10-06: 100 mg via ORAL
  Filled 2020-10-06: qty 2

## 2020-10-06 MED ORDER — ONDANSETRON HCL 4 MG/2ML IJ SOLN
4.0000 mg | Freq: Once | INTRAMUSCULAR | Status: AC
  Administered 2020-10-06: 4 mg via INTRAVENOUS
  Filled 2020-10-06: qty 2

## 2020-10-06 MED ORDER — LIDOCAINE 5 % EX PTCH
1.0000 | MEDICATED_PATCH | Freq: Once | CUTANEOUS | Status: AC
  Administered 2020-10-06: 1 via TRANSDERMAL
  Filled 2020-10-06: qty 1

## 2020-10-06 MED ORDER — MORPHINE SULFATE (PF) 2 MG/ML IV SOLN
2.0000 mg | INTRAVENOUS | Status: AC | PRN
  Administered 2020-10-06 – 2020-10-07 (×2): 2 mg via INTRAVENOUS
  Filled 2020-10-06 (×2): qty 1

## 2020-10-06 NOTE — ED Triage Notes (Signed)
See first nurse note. Pt reports mechanical falling due to shoes. C/o rib pain that increases with deep inhalation. Pt denies hitting head, or LOC, denies any CP or SOB.  A&o x 4 NAD noted.

## 2020-10-06 NOTE — ED Notes (Signed)
Pt states having a mechanical fall today.

## 2020-10-06 NOTE — ED Provider Notes (Signed)
[facility] Emergency Department Provider Note ____________________________________________  Time seen: 14  I have reviewed the triage vital signs and the nursing notes.  HISTORY  Chief Complaint  Fall and Chest Pain  HPI Kelsey Haynes is a 86 y.o. female presents to the ED via EMS from her nursing facility.  Patient apparently sustained a mechanical fall just prior to arrival.   She describes tripping over her shoes, and falling hitting the left side of her chest and rib.  She admits to "hearing" her ribs fracture.  She denies any cough, hemoptysis, or other injury at this time.  Patient reports left rib pain that is worse with movement and palpation.  She denies any head injury, loss of consciousness, nausea, vomiting, or dizziness.  Past Medical History:  Diagnosis Date  . Breast cancer ([place]) right breast  . Cancer ([place]) 2006   breast cancer 2006  . Depression   . Diabetes mellitus without complication ([place])   . Hypertension   . Personal history of radiation therapy   . PONV (postoperative nausea and vomiting)     There are no problems to display for this patient.   Past Surgical History:  Procedure Laterality Date  . ABDOMINAL HYSTERECTOMY    . BREAST BIOPSY Right 2006   +  . BREAST CYST ASPIRATION Right   . CATARACT EXTRACTION W/PHACO Left 11/14/2019   Procedure: CATARACT EXTRACTION PHACO AND INTRAOCULAR LENS PLACEMENT ([place]) LEFT DIABETIC;  Surgeon: Birder Robson, MD;  Location: [facility];  Service: Ophthalmology;  Laterality: Left;  [place] 1:18 CDE 14.93  . CATARACT EXTRACTION W/PHACO Right 12/05/2019   Procedure: CATARACT EXTRACTION PHACO AND INTRAOCULAR LENS PLACEMENT ([facility]) RIGHT DIABETIC 7.58, 00:52.3;  Surgeon: Birder Robson, MD;  Location: [facility];  Service: Ophthalmology;  Laterality: Right;  . endoscopic carpal tunnel release    . HALLUX VALGUS CORRECTION Left 1983  . HERNIA REPAIR     x2  . left foot surgery  Left   . LEFT HEART CATH AND CORONARY ANGIOGRAPHY Left 07/06/2019   Procedure: LEFT HEART CATH AND CORONARY ANGIOGRAPHY;  Surgeon: Yolonda Kida, MD;  Location: [facility];  Service: Cardiovascular;  Laterality: Left;  . SHOULDER SURGERY Bilateral   . SPINE SURGERY     back    Prior to Admission medications   Medication Sig Start Date End Date Taking? Authorizing Provider  acetaminophen (TYLENOL) 500 MG tablet Take 500 mg by mouth every 6 (six) hours as needed for moderate pain or headache.    [provider]  diclofenac sodium (VOLTAREN) 1 % GEL Apply 2 g topically 2 (two) times a day.  05/04/18   [provider]  Multiple Vitamin (MULTI-VITAMINS) TABS Take 1 tablet by mouth daily.     [provider]  Multiple Vitamins-Minerals (HAIR SKIN AND NAILS FORMULA) TABS Take 2 each by mouth daily.     [provider]  Phenazopyridine HCl (AZO TABS PO) Take by mouth at bedtime as needed.    [provider]  predniSONE (DELTASONE) 20 MG tablet Take 20 mg by mouth daily as needed (Take 0.5 - 1 tab for foot pain).    [provider]    Allergies Other  Family History  Problem Relation Age of Onset  . Breast cancer Neg Hx     Social History Social History   Tobacco Use  . Smoking status: Former Smoker    Packs/day: 0.10    Types: Cigarettes    Quit  date: 12/28/1956    Years since quitting: 63.8  . Smokeless tobacco: Never Used  . Tobacco comment: smoked in college  Vaping Use  . Vaping Use: Never used  Substance Use Topics  . Alcohol use: Yes    Comment: 1 drink per month  . Drug use: Not on file    Review of Systems  Constitutional: Negative for fever. Eyes: Negative for visual changes. ENT: Negative for sore throat. Cardiovascular: Negative for chest pain. Respiratory: Negative for shortness of breath. Gastrointestinal: Negative for abdominal pain, vomiting and diarrhea. Genitourinary: Negative for  dysuria. Musculoskeletal: Negative for back pain.  Reports left chest wall pain as above. Skin: Negative for rash. Neurological: Negative for headaches, focal weakness or numbness. ____________________________________________  PHYSICAL EXAM:  VITAL SIGNS: ED Triage Vitals  Enc Vitals Group     BP 10/06/20 1717 (!) 173/90     Pulse Rate 10/06/20 1717 96     Resp 10/06/20 1717 16     Temp 10/06/20 1717 98.2 F (36.8 C)     Temp src --      SpO2 10/06/20 1717 96 %     Weight 10/06/20 1720 150 lb (68 kg)     Height 10/06/20 1720 4\' 11"  (1.499 m)     Head Circumference --      Peak Flow --      Pain Score 10/06/20 1720 8     Pain Loc --      Pain Edu? --      Excl. in [place]? --     Constitutional: Alert and oriented. Well appearing and in no distress. Head: Normocephalic and atraumatic. Eyes: Conjunctivae are normal. Normal extraocular movements Neck: Supple.  No midline tenderness is appreciated. Cardiovascular: Normal rate, regular rhythm. Normal distal pulses. Respiratory: Normal respiratory effort. No wheezes/rales/rhonchi.  No chest wall deformity or abrasion or ecchymosis is noted. Tenderness over the anterolateral left rib cage. Gastrointestinal: Soft and nontender. No distention. Musculoskeletal: Normal spinal alignment. No hip deformity, dislocation, or leg length discrepancy. Left elbow with superficial abrasion and some mild soft tissue swelling. Nontender with normal range of motion in all other extremities.  Neurologic: Normal speech and language. No gross focal neurologic deficits are appreciated. Skin:  Skin is warm, dry and intact. No rash noted. ____________________________________________   RADIOLOGY  DG Left Ribs w/ CXR  IMPRESSION: Mildly displaced fractures involving the left lateral 3rd through 9th ribs.  No evidence of pneumothorax or hemothorax.  DG Left Hip 2V w/ Pelvis  IMPRESSION: No acute displaced fracture or  dislocation. ____________________________________________  LABORATORY  Labs Reviewed  BASIC METABOLIC PANEL - Abnormal; Notable for the following components:      Result Value   Glucose, Bld 198 (*)    Creatinine, Ser 1.03 (*)    Calcium 8.8 (*)    GFR, Estimated 49 (*)    All other components within normal limits  URINALYSIS, COMPLETE (UACMP) WITH MICROSCOPIC - Abnormal; Notable for the following components:   Color, Urine YELLOW (*)    APPearance HAZY (*)    Bacteria, UA RARE (*)    All other components within normal limits  RESPIRATORY PANEL BY RT PCR (FLU A&B, COVID)  CBC WITH DIFFERENTIAL/PLATELET  ____________________________________________  PROCEDURES  Tramadol 100 mg PO Lidoderm Patch 5%  Zofran 4 mg IVP Morphine 2 mg IVP x 2 prn  Procedures ____________________________________________  INITIAL IMPRESSION / ASSESSMENT AND PLAN / ED COURSE  Presents to the ED for evaluation of injury sustained while mechanical  fall at home.  Patient currently tripped over her shoes or reports that her soles got stuck.  She landed on her left chest wall, and presents with acute pain.  She denies any head injury or LOC.  She is overall been stable during her course in the ED.  No signs of acute respiratory distress.  X-ray of the chest and ribs does reveal multiple mildly displaced fractures involving the left lateral third through ninth ribs.  Patient without any other indication of a pneumothorax or hemothorax on exam.  We discussed exam findings with the patient and her husband is present in the room.  They were agreeable to admission at this time for pain control as well as some time to arrange for SNF care at the [facility].  Patient will be screened as appropriate for inpatient admission at this time.  ----------------------------------------- 11:42 PM on 10/06/2020 ----------------------------------------- S/w Dr. Sidney Ace: Patient still reporting pain at 7/10 following IV  pain medicine. He will admit patient for pain control and SNF placement as appropriate.   Kelsey Haynes was evaluated in Emergency Department on 10/06/2020 for the symptoms described in the history of present illness. She was evaluated in the context of the global COVID-19 pandemic, which necessitated consideration that the patient might be at risk for infection with the SARS-CoV-2 virus that causes COVID-19. Institutional protocols and algorithms that pertain to the evaluation of patients at risk for COVID-19 are in a state of rapid change based on information released by regulatory bodies including the CDC and federal and state organizations. These policies and algorithms were followed during the patient's care in the ED. ____________________________________________  FINAL CLINICAL IMPRESSION(S) / ED DIAGNOSES  Final diagnoses:  Multiple rib fractures involving four or more ribs  Fall in home, initial encounter      Melvenia Needles, PA-C 10/06/20 2344    Nance Pear, MD 10/07/20 [phone]

## 2020-10-06 NOTE — ED Triage Notes (Signed)
EMS Report: Pt presents to ED via [facility] with c/o mechanical fall from [place] [facility]. Per EMS pt c/o L rib pain that is worse with movement/palpation.    190/90

## 2020-10-06 NOTE — ED Provider Notes (Signed)
Did do a brief bedside evaluation on this patient who fractured multiple ribs. Pain appeared to be adequately controlled at the time of my examination. Discussed with patient importance of admission for further pain control and pulmonary toilet.    Nance Pear, MD 10/06/20 [phone]

## 2020-10-07 DIAGNOSIS — Z7982 Long term (current) use of aspirin: Secondary | ICD-10-CM | POA: Diagnosis not present

## 2020-10-07 DIAGNOSIS — Z853 Personal history of malignant neoplasm of breast: Secondary | ICD-10-CM | POA: Diagnosis not present

## 2020-10-07 DIAGNOSIS — R079 Chest pain, unspecified: Secondary | ICD-10-CM | POA: Diagnosis present

## 2020-10-07 DIAGNOSIS — S2242XA Multiple fractures of ribs, left side, initial encounter for closed fracture: Secondary | ICD-10-CM

## 2020-10-07 DIAGNOSIS — F32A Depression, unspecified: Secondary | ICD-10-CM | POA: Diagnosis present

## 2020-10-07 DIAGNOSIS — S2242XD Multiple fractures of ribs, left side, subsequent encounter for fracture with routine healing: Secondary | ICD-10-CM | POA: Diagnosis not present

## 2020-10-07 DIAGNOSIS — E1142 Type 2 diabetes mellitus with diabetic polyneuropathy: Secondary | ICD-10-CM | POA: Diagnosis present

## 2020-10-07 DIAGNOSIS — S2249XA Multiple fractures of ribs, unspecified side, initial encounter for closed fracture: Secondary | ICD-10-CM | POA: Diagnosis present

## 2020-10-07 DIAGNOSIS — Z79899 Other long term (current) drug therapy: Secondary | ICD-10-CM | POA: Diagnosis not present

## 2020-10-07 DIAGNOSIS — R410 Disorientation, unspecified: Secondary | ICD-10-CM | POA: Diagnosis present

## 2020-10-07 DIAGNOSIS — Z20822 Contact with and (suspected) exposure to covid-19: Secondary | ICD-10-CM | POA: Diagnosis present

## 2020-10-07 DIAGNOSIS — K219 Gastro-esophageal reflux disease without esophagitis: Secondary | ICD-10-CM | POA: Diagnosis present

## 2020-10-07 DIAGNOSIS — W010XXA Fall on same level from slipping, tripping and stumbling without subsequent striking against object, initial encounter: Secondary | ICD-10-CM | POA: Diagnosis present

## 2020-10-07 DIAGNOSIS — Z923 Personal history of irradiation: Secondary | ICD-10-CM | POA: Diagnosis not present

## 2020-10-07 DIAGNOSIS — I959 Hypotension, unspecified: Secondary | ICD-10-CM | POA: Diagnosis not present

## 2020-10-07 DIAGNOSIS — Z90710 Acquired absence of both cervix and uterus: Secondary | ICD-10-CM | POA: Diagnosis not present

## 2020-10-07 DIAGNOSIS — Z91018 Allergy to other foods: Secondary | ICD-10-CM | POA: Diagnosis not present

## 2020-10-07 DIAGNOSIS — E669 Obesity, unspecified: Secondary | ICD-10-CM | POA: Diagnosis present

## 2020-10-07 DIAGNOSIS — Z87891 Personal history of nicotine dependence: Secondary | ICD-10-CM | POA: Diagnosis not present

## 2020-10-07 DIAGNOSIS — Z6830 Body mass index (BMI) 30.0-30.9, adult: Secondary | ICD-10-CM | POA: Diagnosis not present

## 2020-10-07 DIAGNOSIS — I10 Essential (primary) hypertension: Secondary | ICD-10-CM | POA: Diagnosis present

## 2020-10-07 LAB — GLUCOSE, CAPILLARY
Glucose-Capillary: 181 mg/dL — ABNORMAL HIGH (ref 70–99)
Glucose-Capillary: 157 mg/dL — ABNORMAL HIGH (ref 70–99)
Glucose-Capillary: 116 mg/dL — ABNORMAL HIGH (ref 70–99)
Glucose-Capillary: 150 mg/dL — ABNORMAL HIGH (ref 70–99)

## 2020-10-07 LAB — CBC
HCT: 39 % (ref 36.0–46.0)
Hemoglobin: 12.9 g/dL (ref 12.0–15.0)
MCH: 28.8 pg (ref 26.0–34.0)
MCHC: 33.1 g/dL (ref 30.0–36.0)
MCV: 87.1 fL (ref 80.0–100.0)
Platelets: 156 10*3/uL (ref 150–400)
RBC: 4.48 MIL/uL (ref 3.87–5.11)
RDW: 12.2 % (ref 11.5–15.5)
WBC: 7.1 10*3/uL (ref 4.0–10.5)
nRBC: 0 % (ref 0.0–0.2)

## 2020-10-07 LAB — BASIC METABOLIC PANEL
Anion gap: 11 (ref 5–15)
BUN: 21 mg/dL (ref 8–23)
CO2: 26 mmol/L (ref 22–32)
Calcium: 8.4 mg/dL — ABNORMAL LOW (ref 8.9–10.3)
Chloride: 100 mmol/L (ref 98–111)
Creatinine, Ser: 0.92 mg/dL (ref 0.44–1.00)
GFR, Estimated: 56 mL/min — ABNORMAL LOW (ref >60)
Glucose, Bld: 158 mg/dL — ABNORMAL HIGH (ref 70–99)
Potassium: 3.9 mmol/L (ref 3.5–5.1)
Sodium: 137 mmol/L (ref 135–145)

## 2020-10-07 LAB — HEMOGLOBIN A1C
Hgb A1c MFr Bld: 6.5 % — ABNORMAL HIGH (ref 4.8–5.6)
Mean Plasma Glucose: 139.85 mg/dL

## 2020-10-07 MED ORDER — INSULIN ASPART 100 UNIT/ML ~~LOC~~ SOLN
0.0000 [IU] | Freq: Three times a day (TID) | SUBCUTANEOUS | Status: DC
  Filled 2020-10-07 (×2): qty 1

## 2020-10-07 MED ORDER — ACETAMINOPHEN 325 MG PO TABS
650.0000 mg | ORAL_TABLET | Freq: Four times a day (QID) | ORAL | Status: DC | PRN
  Administered 2020-10-07: 650 mg via ORAL
  Filled 2020-10-07: qty 2

## 2020-10-07 MED ORDER — ACETAMINOPHEN 650 MG RE SUPP: 650.0000 mg | Freq: Four times a day (QID) | RECTAL | Status: DC | PRN

## 2020-10-07 MED ORDER — MORPHINE SULFATE (PF) 2 MG/ML IV SOLN
2.0000 mg | INTRAVENOUS | Status: DC | PRN
  Administered 2020-10-07: 2 mg via INTRAVENOUS
  Filled 2020-10-07: qty 1

## 2020-10-07 MED ORDER — ADULT MULTIVITAMIN W/MINERALS CH
1.0000 | ORAL_TABLET | Freq: Every day | ORAL | Status: DC
  Administered 2020-10-08 – 2020-10-10 (×3): 1 via ORAL
  Filled 2020-10-07 (×3): qty 1

## 2020-10-07 MED ORDER — TRAZODONE HCL 50 MG PO TABS: 25.0000 mg | ORAL_TABLET | Freq: Every evening | ORAL | Status: DC | PRN

## 2020-10-07 MED ORDER — MAGNESIUM HYDROXIDE 400 MG/5ML PO SUSP
30.0000 mL | Freq: Every day | ORAL | Status: DC | PRN
  Administered 2020-10-08: 30 mL via ORAL
  Filled 2020-10-07 (×2): qty 30

## 2020-10-07 MED ORDER — LIDOCAINE 5 % EX PTCH
1.0000 | MEDICATED_PATCH | CUTANEOUS | Status: DC
  Administered 2020-10-07 – 2020-10-10 (×4): 1 via TRANSDERMAL
  Filled 2020-10-07 (×5): qty 1

## 2020-10-07 MED ORDER — GABAPENTIN 100 MG PO CAPS
100.0000 mg | ORAL_CAPSULE | Freq: Three times a day (TID) | ORAL | Status: DC
  Administered 2020-10-07 – 2020-10-10 (×10): 100 mg via ORAL
  Filled 2020-10-07 (×12): qty 1

## 2020-10-07 MED ORDER — ONDANSETRON HCL 4 MG/2ML IJ SOLN: 4.0000 mg | Freq: Four times a day (QID) | INTRAMUSCULAR | Status: DC | PRN

## 2020-10-07 MED ORDER — OXYCODONE HCL 5 MG PO TABS
5.0000 mg | ORAL_TABLET | Freq: Four times a day (QID) | ORAL | Status: DC | PRN
  Administered 2020-10-08: 5 mg via ORAL
  Filled 2020-10-07: qty 1

## 2020-10-07 MED ORDER — ONDANSETRON HCL 4 MG PO TABS: 4.0000 mg | ORAL_TABLET | Freq: Four times a day (QID) | ORAL | Status: DC | PRN

## 2020-10-07 MED ORDER — SODIUM CHLORIDE 0.9% FLUSH: 3.0000 mL | INTRAVENOUS | Status: DC | PRN

## 2020-10-07 MED ORDER — SODIUM CHLORIDE 0.9 % IV SOLN: 250.0000 mL | INTRAVENOUS | Status: DC | PRN

## 2020-10-07 MED ORDER — MELATONIN 5 MG PO TABS
5.0000 mg | ORAL_TABLET | Freq: Every evening | ORAL | Status: DC | PRN
  Filled 2020-10-07: qty 1

## 2020-10-07 MED ORDER — SODIUM CHLORIDE 0.9% FLUSH
3.0000 mL | Freq: Two times a day (BID) | INTRAVENOUS | Status: DC
  Administered 2020-10-07 – 2020-10-08 (×3): 3 mL via INTRAVENOUS

## 2020-10-07 MED ORDER — LABETALOL HCL 5 MG/ML IV SOLN: 20.0000 mg | INTRAVENOUS | Status: DC | PRN

## 2020-10-07 MED ORDER — PANTOPRAZOLE SODIUM 40 MG PO TBEC
40.0000 mg | DELAYED_RELEASE_TABLET | Freq: Every day | ORAL | Status: DC
  Administered 2020-10-07 – 2020-10-10 (×4): 40 mg via ORAL
  Filled 2020-10-07 (×4): qty 1

## 2020-10-07 MED ORDER — ENOXAPARIN SODIUM 40 MG/0.4ML ~~LOC~~ SOLN
40.0000 mg | SUBCUTANEOUS | Status: DC
  Administered 2020-10-07 – 2020-10-09 (×3): 40 mg via SUBCUTANEOUS
  Filled 2020-10-07 (×3): qty 0.4

## 2020-10-07 MED ORDER — ASPIRIN 81 MG PO CHEW
81.0000 mg | CHEWABLE_TABLET | Freq: Every day | ORAL | Status: DC
  Administered 2020-10-07 – 2020-10-10 (×4): 81 mg via ORAL
  Filled 2020-10-07 (×4): qty 1

## 2020-10-07 MED ORDER — IBUPROFEN 400 MG PO TABS
200.0000 mg | ORAL_TABLET | ORAL | Status: DC | PRN
  Administered 2020-10-08 – 2020-10-09 (×4): 200 mg via ORAL
  Filled 2020-10-07 (×4): qty 1

## 2020-10-07 NOTE — H&P (Signed)
PATIENT NAME: Kelsey Haynes    MR#:  [id number]  DATE OF BIRTH:  03/18/1932  DATE OF ADMISSION:  10/06/2020  PRIMARY CARE PHYSICIAN: Baxter Hire, MD   REQUESTING/REFERRING PHYSICIAN: Olegario Shearer beacon, PA-C  CHIEF COMPLAINT:   Chief Complaint  Patient presents with  . Fall  . Chest Pain    HISTORY OF PRESENT ILLNESS:  Kelsey Haynes  is a 86 y.o. Caucasian female with a known history of type 2 diabetes mellitus, hypertension, depression and breast cancer, presented to [facility] accidental mechanical fall.  Patient change her shoes and later on tripped on her own feet per her report with subsequent fall on her left side.  She had subsequent left-sided rib pain that was initially severe.  She has been having difficulty taking deep breath secondarily.  No paresthesias or focal muscle weakness.  She denies any presyncope or syncope.  No headache or dizziness or blurred vision.  No cough or wheezing or hemoptysis.  Upon presentation to the emergency room, blood pressure was 173/90 with otherwise normal vital signs.  Labs revealed a potassium 4.6 with a glucose of 198 and unremarkable CBC.  Influenza antigens and COVID-19 PCR came back negative.  UA will was unremarkable.  Left hip x-ray came back negative for fracture.  Ribs x-ray showed nondisplaced fractures involving the left lateral third through ninth ribs.  The patient was given 4 mg of IV Zofran with 2 mg of IV morphine sulfate, 100 mg of p.o. Toradol and Lidoderm patch.  She was still having persistent left-sided rib pain.  She will be admitted to a observation medical bed for further evaluation and management.   PAST MEDICAL HISTORY:   Past Medical History:  Diagnosis Date  . Breast cancer ([place]) right breast  . Cancer ([place]) 2006   breast cancer 2006  . Depression   . Diabetes mellitus without complication ([place])   . Hypertension   . Personal history of radiation therapy   . PONV  (postoperative nausea and vomiting)     PAST SURGICAL HISTORY:   Past Surgical History:  Procedure Laterality Date  . ABDOMINAL HYSTERECTOMY    . BREAST BIOPSY Right 2006   +  . BREAST CYST ASPIRATION Right   . CATARACT EXTRACTION W/PHACO Left 11/14/2019   Procedure: CATARACT EXTRACTION PHACO AND INTRAOCULAR LENS PLACEMENT ([place]) LEFT DIABETIC;  Surgeon: Birder Robson, MD;  Location: [place];  Service: Ophthalmology;  Laterality: Left;  [place] 1:18 CDE 14.93  . CATARACT EXTRACTION W/PHACO Right 12/05/2019   Procedure: CATARACT EXTRACTION PHACO AND INTRAOCULAR LENS PLACEMENT ([facility]) RIGHT DIABETIC 7.58, 00:52.3;  Surgeon: Birder Robson, MD;  Location: [place];  Service: Ophthalmology;  Laterality: Right;  . endoscopic carpal tunnel release    . HALLUX VALGUS CORRECTION Left 1983  . HERNIA REPAIR     x2  . left foot surgery Left   . LEFT HEART CATH AND CORONARY ANGIOGRAPHY Left 07/06/2019   Procedure: LEFT HEART CATH AND CORONARY ANGIOGRAPHY;  Surgeon: Yolonda Kida, MD;  Location: [facility];  Service: Cardiovascular;  Laterality: Left;  . SHOULDER SURGERY Bilateral   . SPINE SURGERY     back    SOCIAL HISTORY:   Social History   Tobacco Use  . Smoking status: Former Smoker    Packs/day: 0.10    Types: Cigarettes    Quit date: 12/28/1956    Years since quitting: 63.8  . Smokeless  tobacco: Never Used  . Tobacco comment: smoked in college  Substance Use Topics  . Alcohol use: Yes    Comment: 1 drink per month    FAMILY HISTORY:   Family History  Problem Relation Age of Onset  . Breast cancer Neg Hx     DRUG ALLERGIES:   Allergies  Allergen Reactions  . Other Swelling and Other (See Comments)    Hazel nuts    REVIEW OF SYSTEMS:   ROS As per history of present illness. All pertinent systems were reviewed above. Constitutional, HEENT, cardiovascular, respiratory, GI, GU, musculoskeletal, neuro, psychiatric, endocrine,  integumentary and hematologic systems were reviewed and are otherwise negative/unremarkable except for positive findings mentioned above in the HPI.   MEDICATIONS AT HOME:   Prior to Admission medications   Medication Sig Start Date End Date Taking? Authorizing Provider  acetaminophen (TYLENOL) 325 MG tablet Take 650 mg by mouth every 6 (six) hours as needed for moderate pain or headache.    Yes [provider]  aspirin 81 MG chewable tablet Chew 1 tablet by mouth daily. 05/29/20 05/29/21 Yes [provider]  diclofenac sodium (VOLTAREN) 1 % GEL Apply 2 g topically 2 (two) times a day.  05/04/18  Yes [provider]  gabapentin (NEURONTIN) 100 MG capsule Take 200 mg in the morning, 100 mg in the afternoon, and 100 mg at night for one week, then increase to 200 mg in the morning, 200 mg in the afternoon, and 100 mg at night for one week, then increase to 200 mg three times per day. 10/02/20  Yes [provider]  ibuprofen (ADVIL) 200 MG tablet Take 200 mg by mouth every 6 (six) hours as needed.   Yes [provider]  melatonin 3 MG TABS tablet Take 1 tablet by mouth at bedtime. 04/16/20  Yes [provider]  Multiple Vitamin (MULTI-VITAMINS) TABS Take 1 tablet by mouth daily.    Yes [provider]  omeprazole (PRILOSEC) 20 MG capsule Take 20 mg by mouth every morning. 09/30/20  Yes [provider]  Multiple Vitamins-Minerals (HAIR SKIN AND NAILS FORMULA) TABS Take 2 each by mouth daily.  Patient not taking: Reported on 10/07/2020    [provider]  Phenazopyridine HCl (AZO TABS PO) Take by mouth at bedtime as needed. Patient not taking: Reported on 10/07/2020    [provider]  predniSONE (DELTASONE) 20 MG tablet Take 20 mg by mouth daily as needed (Take 0.5 - 1 tab for foot pain). Patient not taking: Reported on 10/07/2020    [provider]      VITAL SIGNS:  Blood pressure (!) 154/82, pulse 91,  temperature 98.2 F (36.8 C), resp. rate 14, height 4\' 11"  (1.499 m), weight 68 kg, SpO2 96 %.  PHYSICAL EXAMINATION:  Physical Exam  GENERAL:  86 y.o.-year-old Caucasian female patient lying in the bed with mild respiratory distress with deep breathing from pain. EYES: Pupils equal, round, reactive to light and accommodation. No scleral icterus. Extraocular muscles intact.  HEENT: Head atraumatic, normocephalic. Oropharynx and nasopharynx clear.  NECK:  Supple, no jugular venous distention. No thyroid enlargement, no tenderness.  LUNGS: Normal breath sounds bilaterally, no wheezing, rales,rhonchi or crepitation. No use of accessory muscles of respiration.  CARDIOVASCULAR: Regular rate and rhythm, S1, S2 normal. No murmurs, rubs, or gallops.  ABDOMEN: Soft, nondistended, nontender. Bowel sounds present. No organomegaly or mass.  EXTREMITIES: No pedal edema, cyanosis, or clubbing.  NEUROLOGIC: Cranial nerves II through XII  are intact. Muscle strength 5/5 in all extremities. Sensation intact. Gait not checked. Musculoskeletal: Left-sided chest wall tenderness from 3rd-9th ribs. PSYCHIATRIC: The patient is alert and oriented x 3.  Normal affect and good eye contact. SKIN: No obvious rash, lesion, or ulcer.   LABORATORY PANEL:   CBC Recent Labs  Lab 10/06/20 2015  WBC 9.5  HGB 13.4  HCT 39.9  PLT 157   ------------------------------------------------------------------------------------------------------------------  Chemistries  Recent Labs  Lab 10/06/20 2015  NA 138  K 4.6  CL 102  CO2 23  GLUCOSE 198*  BUN 22  CREATININE 1.03*  CALCIUM 8.8*   ------------------------------------------------------------------------------------------------------------------  Cardiac Enzymes No results for input(s): TROPONINI in the last 168 hours. ------------------------------------------------------------------------------------------------------------------  RADIOLOGY:  DG Ribs  Unilateral W/Chest Left  Result Date: 10/06/2020 CLINICAL DATA:  Fall.  Left chest and rib pain. EXAM: LEFT RIBS AND CHEST - 3+ VIEW COMPARISON:  Chest radiograph on 05/10/2020 FINDINGS: Mildly displaced fractures are seen involving the left lateral 3rd through 9th ribs. No evidence of pneumothorax or hemothorax. Both lungs are clear. IMPRESSION: Mildly displaced fractures involving the left lateral 3rd through 9th ribs. No evidence of pneumothorax or hemothorax. Electronically Signed   By: Marlaine Hind M.D.   On: 10/06/2020 19:25   DG Hip Unilat W or Wo Pelvis 2-3 Views Left  Result Date: 10/06/2020 CLINICAL DATA:  Left hip pain status post fall. EXAM: DG HIP (WITH OR WITHOUT PELVIS) 2-3V LEFT COMPARISON:  None. FINDINGS: There is no acute displaced fracture or dislocation. There are degenerative changes bilaterally. IMPRESSION: No acute displaced fracture or dislocation. Electronically Signed   By: Constance Holster M.D.   On: 10/06/2020 22:54      IMPRESSION AND PLAN:  1.  Left-sided multiple rib fractures involving left third through ninth ribs secondary to mechanical fall. -The patient will be admitted to observation an medical bed. -Pain management will be provided. -Pulmonary toiletry will be provided as needed.  2.  Uncontrolled hypertension, like secondary to pain. -We will continue her antihypertensives. -As needed IV labetalol will be added.  3.  Type 2 diabetes mellitus with peripheral neuropathy. -We will continue her Neurontin. -She will be placed on supplement coverage with NovoLog.  4.  DVT prophylaxis. The subtenons Lovenox.    All the records are reviewed and case discussed with ED provider. The plan of care was discussed in details with the patient (and family). I answered all questions. The patient agreed to proceed with the above mentioned plan. Further management will depend upon hospital course.   CODE STATUS: Full code  Status is: Observation  The  patient remains OBS appropriate and will d/c before 2 midnights.  Dispo: The patient is from: Group home independent living              Anticipated d/c is to: Group home independent living              Anticipated d/c date is: 1 day              Patient currently is not medically stable to d/c.   TOTAL TIME TAKING CARE OF THIS PATIENT: 55 minutes.    Christel Mormon M.D on 10/07/2020 at 1:51 AM  [facility]   From 7 PM-7 AM, contact night-coverage [email or web address]  CC: Primary care physician; Baxter Hire, MD

## 2020-10-07 NOTE — Progress Notes (Signed)
Report called to Sierra, RN.

## 2020-10-07 NOTE — Evaluation (Addendum)
Occupational Therapy Evaluation Patient Details Name: Kelsey Haynes MRN: [id number] DOB: 26-Feb-1932 Today's Date: 10/07/2020    History of Present Illness Kelsey Haynes  is a 86 y.o. Caucasian female with a known history of type 2 diabetes mellitus, hypertension, depression and breast cancer, presented to [facility] accidental mechanical fall.  Patient change her shoes and later on tripped on her own feet per her report with subsequent fall on her left side.  She had subsequent left-sided rib pain that was initially severe. Ribs x-ray showed nondisplaced fractures involving the left lateral third through ninth ribs.   Clinical Impression   Ms Roskos was seen for OT evaluation this date. Prior to hospital admission, pt was MOD I for mobility and ADLs using 4WW. Per conversation c [place] [facility] [employer], pt lives c 8yo husband in their independent living apartments. Pt presents to acute OT demonstrating impaired ADL performance and functional mobility 2/2 impaired functional use of dominant LUE, decreased activity tolerance, functional strength/ROM/balance deficits, and poor command following. Pt currently requires MOD A exit L side of bed. MIN A self-feeding seated EOB. MAX A don B socks seated EOB. MOD A + HHA sit<>stand, unable to take steps at EOB. Pt endorses dizziness t/o session, vitals stable t/o, full orthostatics not obtained. Anticipate MAX A for BSC t/f, MOD A for UBD. Pt would benefit from skilled OT to address noted impairments and functional limitations (see below for any additional details) in order to maximize safety and independence while minimizing falls risk and caregiver burden. Upon hospital discharge, recommend STR to maximize pt safety and return to PLOF.  Seated: BP 134/60, MAP 82, HR 88 Supine following endorsed dizziness in standing: 148/66, MAP 90, HR 91    Follow Up Recommendations  SNF    Equipment Recommendations  Other (comment) (TBD at next venue of  care)    Recommendations for Other Services       Precautions / Restrictions Precautions Precautions: Fall Precaution Comments: rib fxs Restrictions Weight Bearing Restrictions: No      Mobility Bed Mobility Overal bed mobility: Needs Assistance Bed Mobility: Sit to Supine;Supine to Sit     Supine to sit: Mod assist;HOB elevated Sit to supine: Max assist      Transfers Overall transfer level: Needs assistance Equipment used: 1 person hand held assist Transfers: Sit to/from Stand Sit to Stand: Mod assist    General transfer comment: Pt unable to take steps at EOB    Balance Overall balance assessment: Needs assistance Sitting-balance support: Single extremity supported;Feet supported Sitting balance-Leahy Scale: Fair     Standing balance support: Bilateral upper extremity supported Standing balance-Leahy Scale: Poor        ADL either performed or assessed with clinical judgement   ADL Overall ADL's : Needs assistance/impaired    General ADL Comments: MIN A self-feeding seated EOB. MAX A don B socks seated EOB. Anticipate MAX A for BSC t/f, MOD A for UBD      Vision Baseline Vision/History: Wears glasses Wears Glasses: At all times Additional Comments: Pt noted to initially misjudge distance to scoop food, improves c repetition            Pertinent Vitals/Pain Pain Assessment: Faces Faces Pain Scale: Hurts little more Pain Location: L flank Pain Descriptors / Indicators: Dull;Grimacing Pain Intervention(s): Limited activity within patient's tolerance;Monitored during session;Repositioned     Hand Dominance Left   Extremity/Trunk Assessment Upper Extremity Assessment Upper Extremity Assessment: LUE deficits/detail;RUE deficits/detail RUE Deficits / Details: AROM  WFL grossly. Grip 4+/5 LUE Deficits / Details: PROM shoulder flexion ~45*, grip 3+/5   Lower Extremity Assessment Lower Extremity Assessment: Generalized weakness (wearing R knee brace  )       Communication Communication Communication: Receptive difficulties   Cognition Arousal/Alertness: Awake/alert Behavior During Therapy: Flat affect Overall Cognitive Status: No family/caregiver present to determine baseline cognitive functioning        General Comments: Follows 1 step commands c increased time and repetition. When asked to rate pain on scale pt responded "you're asking me to rate how good my applesauce is?" Pt unable to answer certain home setup questions stating "i don't remember"   General Comments  Seated: BP 134/60, MAP 82, HR 88. Supine (emdorsed dizziness in standing): 148/66, MAP 90, HR 91    Exercises Exercises: Other exercises Other Exercises Other Exercises: Pt educated re: OT role, DME recs, d/c recs, falls prevention, ECS Other Exercises: LBD, self-feeding, sup<>sit, sit<>stand, sitting/standing balance/tolerance   Shoulder Instructions      Home Living Family/patient expects to be discharged to:: Assisted living      Home Equipment: Walker - 4 wheels;Cane - single point;Grab bars - tub/shower   Additional Comments: [place] [employer] States could hire help       Prior Functioning/Environment Level of Independence: Independent with assistive device(s)        Comments: Endorses falls hx.         OT Problem List: Decreased strength;Decreased range of motion;Decreased activity tolerance;Impaired balance (sitting and/or standing);Decreased safety awareness;Impaired UE functional use      OT Treatment/Interventions: Self-care/ADL training;Therapeutic exercise;Energy conservation;DME and/or AE instruction;Therapeutic activities;Patient/family education;Balance training    OT Goals(Current goals can be found in the care plan section) Acute Rehab OT Goals Patient Stated Goal: To feel better OT Goal Formulation: With patient Time For Goal Achievement: 10/21/20 Potential to Achieve Goals: Good ADL Goals Pt Will Perform  Grooming: with modified independence;sitting Pt Will Perform Lower Body Dressing: with min assist;sitting/lateral leans Pt Will Transfer to Toilet: with min guard assist;ambulating;bedside commode (c LRAD PRN)  OT Frequency: Min 1X/week   Barriers to D/C: Decreased caregiver support             AM-PAC OT "6 Clicks" Daily Activity     Outcome Measure Help from another person eating meals?: A Little Help from another person taking care of personal grooming?: A Little Help from another person toileting, which includes using toliet, bedpan, or urinal?: A Lot Help from another person bathing (including washing, rinsing, drying)?: A Lot Help from another person to put on and taking off regular upper body clothing?: A Lot Help from another person to put on and taking off regular lower body clothing?: A Lot 6 Click Score: 14   End of Session Equipment Utilized During Treatment: Oxygen (1.5L [place])  Activity Tolerance: Patient limited by fatigue Patient left: in bed;with call bell/phone within reach  OT Visit Diagnosis: Other abnormalities of gait and mobility (R26.89);Repeated falls (R29.6);Muscle weakness (generalized) (M62.81)                Time: 6195-0932 OT Time Calculation (min): 30 min Charges:  OT General Charges $OT Visit: 1 Visit OT Evaluation $OT Eval Moderate Complexity: 1 Mod OT Treatments $Self Care/Home Management : 8-22 mins $Therapeutic Activity: 8-22 mins  Dessie Coma, M.S. OTR/L  10/07/20, 10:20 AM  ascom [phone]

## 2020-10-07 NOTE — Evaluation (Signed)
Physical Therapy Evaluation Patient Details Name: Kelsey Haynes MRN: [id number] DOB: [birth year] Today's Date: 10/07/2020   History of Present Illness  Laporsche Haynes  is a 86 y.o. Caucasian female with a known history of type 2 diabetes mellitus, hypertension, depression and breast cancer, presented to [facility] after a  mechanical fall.  Patient change her shoes and later on tripped on her own feet per her report with subsequent fall on her left side.  She had subsequent left-sided rib pain that was initially severe. Ribs x-ray showed nondisplaced fractures involving the left lateral third through ninth ribs.  Clinical Impression  86 yo Female presents to hospital s/p fall with left rib fractures. Patient was living at [facility] in independent living with her husband. She reports using a rollator for most ambulation and has had trouble with her balance from time to time. Patient received pain meds during PT evaluation and was very lethargic after pain meds which limited some mobility. She did require increased time/effort for most ADLs. Patient required min A for supine to sitting edge of bed. She was able to scoot her feet off the bed but did require assistance for lifting trunk to sitting position. She was able to transfer sit<>stand and stand pivot to bedside commode with min A with RW. Patient does report increased dizziness throughout session. Vitals assessed. While supine BP: 147/74, upon sitting edge of bed, BP increased to 157/112; after sitting for 2-3 min, BP decreased to 137/76; SPO2 levels were >98% throughout on 2L O2. Patient does require increased cues for safety awareness and positioning. She was able to exhibit good control with RW. She would benefit from additional skilled PT intervention to improve strength, balance and mobility. Concerned about safety at home as she is limited to short distance ambulation and is very lethargic having trouble following instruction.  Patient agreeable to short term rehab;     Follow Up Recommendations SNF    Equipment Recommendations  Other (comment) (to be determined;)    Recommendations for Other Services       Precautions / Restrictions Precautions Precautions: Fall Precaution Comments: rib fxs Restrictions Weight Bearing Restrictions: No      Mobility  Bed Mobility Overal bed mobility: Needs Assistance Bed Mobility: Sit to Supine;Supine to Sit     Supine to sit: Min assist;HOB elevated Sit to supine: Max assist   General bed mobility comments: required increased time/effort; patient able to scoot LE Off bed but does require assistance for lifting trunk to sitting  Transfers Overall transfer level: Needs assistance Equipment used: Rolling walker (2 wheeled) Transfers: Sit to/from [facility] Sit to Stand: Min assist Stand pivot transfers: Min assist       General transfer comment: patient required increased time/effort. She reports increased fatigue and dizziness. vitals monitored; She was able to transfer sit<>stand from bedside commode x2 reps with min A and perform stand pivot transfer to recliner x1 rep; does require cues for hand placement for safety awareness;  Ambulation/Gait Ambulation/Gait assistance: Min assist Gait Distance (Feet): 5 Feet Assistive device: Rolling walker (2 wheeled) Gait Pattern/deviations: Decreased step length - right;Decreased step length - left;Step-through pattern Gait velocity: decreased   General Gait Details: does require increased time and effort. Required min A for safety with cues for foot placement. Patient able to manage walker well with good walker placement  Stairs            Wheelchair Mobility    Modified Rankin (Stroke Patients Only)  Balance Overall balance assessment: Needs assistance Sitting-balance support: Single extremity supported;Feet supported Sitting balance-Leahy Scale: Fair     Standing balance  support: Bilateral upper extremity supported Standing balance-Leahy Scale: Fair Standing balance comment: requires RW and min A for safety;                             Pertinent Vitals/Pain Pain Assessment: 0-10 Pain Score: 3  Faces Pain Scale: Hurts little more Pain Location: left flank Pain Descriptors / Indicators: Aching;Sore Pain Intervention(s): Limited activity within patient's tolerance;Repositioned;RN gave pain meds during session    [place] expects to be discharged to:: Other (Comment) ([place], independent living;)               Home Equipment: Environmental consultant - 4 wheels;Cane - single point;Grab bars - tub/shower Additional Comments: [place] [employer] States could hire help     Prior Function Level of Independence: Independent with assistive device(s)         Comments: Endorses falls hx. would use Rollator for most ambulation     Hand Dominance   Dominant Hand: Left    Extremity/Trunk Assessment   Upper Extremity Assessment Upper Extremity Assessment: LUE deficits/detail;RUE deficits/detail RUE Deficits / Details: AROM WFL grossly. Grip 4+/5 LUE Deficits / Details: PROM shoulder flexion ~45*, grip 3+/5    Lower Extremity Assessment Lower Extremity Assessment: Generalized weakness (grossly 4/5)    Cervical / Trunk Assessment Cervical / Trunk Assessment: Kyphotic  Communication   Communication: Receptive difficulties  Cognition Arousal/Alertness: Lethargic Behavior During Therapy: Flat affect Overall Cognitive Status: No family/caregiver present to determine baseline cognitive functioning                                 General Comments: Follows 1 step commands c increased time and repetition. Pt very lethargic and often falls asleep requiring repeat instruction/questions; this could be related to medication;      General Comments General comments (skin integrity, edema, etc.):  Seated: BP 134/60, MAP 82, HR 88. Supine (emdorsed dizziness in standing): 148/66, MAP 90, HR 91    Exercises Other Exercises Other Exercises: Pt educated re: OT role, DME recs, d/c recs, falls prevention, ECS Other Exercises: LBD, self-feeding, sup<>sit, sit<>stand, sitting/standing balance/tolerance   Assessment/Plan    PT Assessment Patient needs continued PT services  PT Problem List Decreased strength;Decreased mobility;Decreased safety awareness;Decreased activity tolerance;Decreased balance       PT Treatment Interventions DME instruction;Gait training;Functional mobility training;Therapeutic activities;Therapeutic exercise;Balance training;Patient/family education    PT Goals (Current goals can be found in the Care Plan section)  Acute Rehab PT Goals Patient Stated Goal: to get better PT Goal Formulation: With patient Time For Goal Achievement: 10/21/20 Potential to Achieve Goals: Good    Frequency Min 2X/week   Barriers to discharge Decreased caregiver support lives with spouse, unsure how much assistance he can give;    Co-evaluation               AM-PAC PT "6 Clicks" Mobility  Outcome Measure Help needed turning from your back to your side while in a flat bed without using bedrails?: A Lot Help needed moving from lying on your back to sitting on the side of a flat bed without using bedrails?: A Little Help needed moving to and from a bed to a chair (including a wheelchair)?: A Little Help needed  standing up from a chair using your arms (e.g., wheelchair or bedside chair)?: A Little Help needed to walk in hospital room?: A Little Help needed climbing 3-5 steps with a railing? : A Lot 6 Click Score: 16    End of Session Equipment Utilized During Treatment: Gait belt Activity Tolerance: Patient limited by fatigue Patient left: in chair;with call bell/phone within reach;with chair alarm set Nurse Communication: Mobility status PT Visit Diagnosis:  Unsteadiness on feet (R26.81);Muscle weakness (generalized) (M62.81)    Time: 7124-5809 PT Time Calculation (min) (ACUTE ONLY): 52 min   Charges:   PT Evaluation $PT Eval Low Complexity: 1 Low            Kelda Azad PT, DPT 10/07/2020, 12:32 PM

## 2020-10-07 NOTE — Progress Notes (Addendum)
PROGRESS NOTE    Kelsey Haynes  GUR:[id number] DOB: 08/08/1932 DOA: 10/06/2020 PCP: Baxter Hire, MD   Chief Complain: Fall, chest pain  Brief Narrative:  Patient is a 86 year old female with history of type 2 diabetes mellitus, hypertension, depression, breast cancer who was brought to the emergency department after she fell.  She fell when she was trying to change her shoes, falling on the left side.  After the fall, she started having left-sided chest pain and difficulty taking deep breath.  On presentation she was mildly hypertensive.  Imagings done in the emergency room did not show any fractures but showed left-sided mildly displaced  fractures involving the third through ninth ribs.  She has history of chronic falls.  Patient was admitted for the evaluation of PT/OT and pain control.  PT/OT recommended skilled nursing facility on discharge.  TOC consulted. Patient is medically stable for discharge to skilled nursing facility as soon as bed is available.    Assessment & Plan:   Active Problems:   Multiple rib fractures   Fall/left-sided multiple rib fractures: Chest x-ray as above.  Currently pain is well controlled.  Continue pain management.  I have encouraged the patient to use incentive spirometer to prevent atelectasis. Patient reports history of chronic falls at home.  She lives with her husband.  She moves with the with the help of walker/cane at baseline.  PT/OT consulted who recommended skilled nursing facility on discharge. She is currently on 2 L of oxygen per minute, will try to taper the oxygen.  Hypertension: No history of hypertension and does not take any medications at home.  She was hypertensive on presentation and has remained mildly hypertensive which could be associated with the pain.Monitor blood pressure.  Continue as needed meds.  If she continues to remain hypertensive, will consider putting her on antihypertensives on discharge.  Type 2 diabetes  mellitus with peripheral neuropathy: Continue sliding scale insulin.  Resume her home medications on discharge.  Neurontin for neuropathy  GERD: Takes PPI at home.           DVT prophylaxis:Lovenox Code Status: Full Family Communication: Called and discussed with  husband on phone Status is: Observation  The patient remains OBS appropriate and will d/c before 2 midnights.  Dispo: The patient is from: Home              Anticipated d/c is to: SNF              Anticipated d/c date is: 2 days              Patient currently is medically stable to d/c.      Consultants: None  Procedures:None  Antimicrobials:  Anti-infectives (From admission, onward)   None      Subjective: Patient seen and examined the bedside this morning.  Not in distress.  She states her pain is better on current pain medicine regimen but see has pain when she moves.  I encouraged her to use incentive spirometer.  Objective: Vitals:   10/07/20 0802 10/07/20 0900 10/07/20 1037 10/07/20 1115  BP: (!) 128/57 (!) 147/63  (!) 160/79  Pulse: 83 81  90  Resp: 11 11  15   Temp:  98.6 F (37 C) 97.9 F (36.6 C) 98.2 F (36.8 C)  TempSrc:  Oral Oral Oral  SpO2: 96% 97%  99%  Weight:      Height:       No intake or output data in the 24 hours  ending 10/07/20 1139 Filed Weights   10/06/20 1720  Weight: 68 kg    Examination:  General exam: Not in distress, pleasant elderly female HEENT:PERRL,Oral mucosa moist, Ear/Nose normal on gross exam Respiratory system: Bilateral equal air entry, normal vesicular breath sounds, no wheezes or crackles .  Tenderness on the left chest Cardiovascular system: S1 & S2 heard, RRR. No JVD, murmurs, rubs, gallops or clicks. No pedal edema. Gastrointestinal system: Abdomen is nondistended, soft and nontender. No organomegaly or masses felt. Normal bowel sounds heard. Central nervous system: Alert and oriented. No focal neurological deficits. Extremities: No edema, no  clubbing ,no cyanosis Skin: No rashes, lesions or ulcers,no icterus ,no pallor   Data Reviewed: I have personally reviewed following labs and imaging studies  CBC: Recent Labs  Lab 10/06/20 2015 10/07/20 0451  WBC 9.5 7.1  NEUTROABS 7.6  --   HGB 13.4 12.9  HCT 39.9 39.0  MCV 86.9 87.1  PLT 157 035   Basic Metabolic Panel: Recent Labs  Lab 10/06/20 2015 10/07/20 0451  NA 138 137  K 4.6 3.9  CL 102 100  CO2 23 26  GLUCOSE 198* 158*  BUN 22 21  CREATININE 1.03* 0.92  CALCIUM 8.8* 8.4*   GFR: Estimated Creatinine Clearance: 36.8 mL/min (by C-G formula based on SCr of 0.92 mg/dL). Liver Function Tests: No results for input(s): AST, ALT, ALKPHOS, BILITOT, PROT, ALBUMIN in the last 168 hours. No results for input(s): LIPASE, AMYLASE in the last 168 hours. No results for input(s): AMMONIA in the last 168 hours. Coagulation Profile: No results for input(s): INR, PROTIME in the last 168 hours. Cardiac Enzymes: No results for input(s): CKTOTAL, CKMB, CKMBINDEX, TROPONINI in the last 168 hours. BNP (last 3 results) No results for input(s): PROBNP in the last 8760 hours. HbA1C: No results for input(s): HGBA1C in the last 72 hours. CBG: Recent Labs  Lab 10/07/20 0813 10/07/20 1118  GLUCAP 181* 157*   Lipid Profile: No results for input(s): CHOL, HDL, LDLCALC, TRIG, CHOLHDL, LDLDIRECT in the last 72 hours. Thyroid Function Tests: No results for input(s): TSH, T4TOTAL, FREET4, T3FREE, THYROIDAB in the last 72 hours. Anemia Panel: No results for input(s): VITAMINB12, FOLATE, FERRITIN, TIBC, IRON, RETICCTPCT in the last 72 hours. Sepsis Labs: No results for input(s): PROCALCITON, LATICACIDVEN in the last 168 hours.  Recent Results (from the past 240 hour(s))  Respiratory Panel by RT PCR (Flu A&B, Covid) - Nasopharyngeal Swab     Status: None   Collection Time: 10/06/20  8:15 PM   Specimen: Nasopharyngeal Swab  Result Value Ref Range Status   SARS Coronavirus 2 by RT  PCR NEGATIVE NEGATIVE Final    Comment: (NOTE) SARS-CoV-2 target nucleic acids are NOT DETECTED.  The SARS-CoV-2 RNA is generally detectable in upper respiratoy specimens during the acute phase of infection. The lowest concentration of SARS-CoV-2 viral copies this assay can detect is 131 copies/mL. A negative result does not preclude SARS-Cov-2 infection and should not be used as the sole basis for treatment or other patient management decisions. A negative result may occur with  improper specimen collection/handling, submission of specimen other than nasopharyngeal swab, presence of viral mutation(s) within the areas targeted by this assay, and inadequate number of viral copies (<131 copies/mL). A negative result must be combined with clinical observations, patient history, and epidemiological information. The expected result is Negative.  Fact Sheet for Patients:  PinkCheek.be  Fact Sheet for Healthcare Providers:  GravelBags.it  This test is no t yet approved  or cleared by the [place] and  has been authorized for detection and/or diagnosis of SARS-CoV-2 by FDA under an Emergency Use Authorization (EUA). This EUA will remain  in effect (meaning this test can be used) for the duration of the COVID-19 declaration under Section 564(b)(1) of the Act, 21 U.S.C. section 360bbb-3(b)(1), unless the authorization is terminated or revoked sooner.     Influenza A by PCR NEGATIVE NEGATIVE Final   Influenza B by PCR NEGATIVE NEGATIVE Final    Comment: (NOTE) The Xpert Xpress SARS-CoV-2/FLU/RSV assay is intended as an aid in  the diagnosis of influenza from Nasopharyngeal swab specimens and  should not be used as a sole basis for treatment. Nasal washings and  aspirates are unacceptable for Xpert Xpress SARS-CoV-2/FLU/RSV  testing.  Fact Sheet for Patients: PinkCheek.be  Fact Sheet for  Healthcare Providers: GravelBags.it  This test is not yet approved or cleared by the [place] FDA and  has been authorized for detection and/or diagnosis of SARS-CoV-2 by  FDA under an Emergency Use Authorization (EUA). This EUA will remain  in effect (meaning this test can be used) for the duration of the  Covid-19 declaration under Section 564(b)(1) of the Act, 21  U.S.C. section 360bbb-3(b)(1), unless the authorization is  terminated or revoked. Performed at [facility], [address], [place], [place] [address]          Radiology Studies: DG Ribs Unilateral W/Chest Left  Result Date: 10/06/2020 CLINICAL DATA:  Fall.  Left chest and rib pain. EXAM: LEFT RIBS AND CHEST - 3+ VIEW COMPARISON:  Chest radiograph on 05/10/2020 FINDINGS: Mildly displaced fractures are seen involving the left lateral 3rd through 9th ribs. No evidence of pneumothorax or hemothorax. Both lungs are clear. IMPRESSION: Mildly displaced fractures involving the left lateral 3rd through 9th ribs. No evidence of pneumothorax or hemothorax. Electronically Signed   By: Marlaine Hind M.D.   On: 10/06/2020 19:25   DG Hip Unilat W or Wo Pelvis 2-3 Views Left  Result Date: 10/06/2020 CLINICAL DATA:  Left hip pain status post fall. EXAM: DG HIP (WITH OR WITHOUT PELVIS) 2-3V LEFT COMPARISON:  None. FINDINGS: There is no acute displaced fracture or dislocation. There are degenerative changes bilaterally. IMPRESSION: No acute displaced fracture or dislocation. Electronically Signed   By: Constance Holster M.D.   On: 10/06/2020 22:54        Scheduled Meds: . aspirin  81 mg Oral Daily  . enoxaparin (LOVENOX) injection  40 mg Subcutaneous Q24H  . gabapentin  100 mg Oral TID  . insulin aspart  0-9 Units Subcutaneous TID PC & HS  . lidocaine  1 patch Transdermal Q24H  . multivitamin with minerals  1 tablet Oral Daily  . pantoprazole  40 mg Oral Daily  . sodium chloride flush   3 mL Intravenous Q12H   Continuous Infusions: . sodium chloride       LOS: 0 days    Time spent: More than 50% of that time was spent in counseling and/or coordination of care.      Shelly Coss, MD [facility] P10/10/2020, 11:39 AM

## 2020-10-07 NOTE — ED Notes (Signed)
Pt scooted to end of bed and was sitting on end of bed. 3RNs assisted pt back to bed. Pt noted to have urinated in depends. Depends removed and new chux placed under pt.

## 2020-10-07 NOTE — ED Notes (Signed)
pts oxygen dropped to 89% on room air. Pt placed on 2lpm via [place] of oxygen and oxygen is now 97%

## 2020-10-08 DIAGNOSIS — S2242XA Multiple fractures of ribs, left side, initial encounter for closed fracture: Secondary | ICD-10-CM | POA: Diagnosis not present

## 2020-10-08 LAB — GLUCOSE, CAPILLARY
Glucose-Capillary: 140 mg/dL — ABNORMAL HIGH (ref 70–99)
Glucose-Capillary: 137 mg/dL — ABNORMAL HIGH (ref 70–99)
Glucose-Capillary: 244 mg/dL — ABNORMAL HIGH (ref 70–99)
Glucose-Capillary: 200 mg/dL — ABNORMAL HIGH (ref 70–99)

## 2020-10-08 MED ORDER — AMLODIPINE BESYLATE 10 MG PO TABS
10.0000 mg | ORAL_TABLET | Freq: Every day | ORAL | Status: DC
  Administered 2020-10-08 – 2020-10-10 (×3): 10 mg via ORAL
  Filled 2020-10-08 (×3): qty 1

## 2020-10-08 NOTE — TOC Initial Note (Signed)
Transition of Care [facility]) - Initial/Assessment Note    Patient Details  Name: Kelsey Haynes MRN: [id number] Date of Birth: 1932-02-10  [facility] of [facility]) CM/SW Contact:    Shelbie Ammons, RN Phone Number: 10/08/2020, 4:01 PM  Clinical Narrative:   RNCM met with patient and husband in room. Patient talking on phone with daughter. Discussed current recommendations for SNF placement at d/c. Patient and husband live at [facility] and request that she be transferred over there when ready for discharge. RNCM completed PASSR including faxing in additional information, completed FL2 and sent bed request to SNF.  RNCM will continue to follow.            Expected Discharge Plan: Skilled Nursing Facility Barriers to Discharge: No Barriers Identified   Patient Goals and CMS Choice        Expected Discharge Plan and Services Expected Discharge Plan: [place]   Discharge Planning Services: CM Consult Post Acute Care Choice: [place] Living arrangements for the past 2 months: Apartment                                      Prior Living Arrangements/Services Living arrangements for the past 2 months: Apartment Lives with:: Spouse Patient language and need for interpreter reviewed:: Yes        Need for Family Participation in Patient Care: Yes (Comment) Care giver support system in place?: Yes (comment)   Criminal Activity/Legal Involvement Pertinent to Current Situation/Hospitalization: No - Comment as needed  Activities of Daily Living Home Assistive Devices/Equipment: Walker (specify type) ADL Screening (condition at time of admission) Patient's cognitive ability adequate to safely complete daily activities?: Yes Is the patient deaf or have difficulty hearing?: No Does the patient have difficulty seeing, even when wearing glasses/contacts?: No Does the patient have difficulty concentrating, remembering, or making decisions?:  Yes Patient able to express need for assistance with ADLs?: Yes Does the patient have difficulty dressing or bathing?: No Independently performs ADLs?: Yes (appropriate for developmental age) Does the patient have difficulty walking or climbing stairs?: Yes Weakness of Legs: None Weakness of Arms/Hands: None  Permission Sought/Granted                  Emotional Assessment Appearance:: Appears stated age Attitude/Demeanor/Rapport: Engaged   Orientation: : Oriented to Situation, Oriented to  Time, Oriented to Place, Oriented to Self Alcohol / Substance Use: Not Applicable Psych Involvement: No (comment)  Admission diagnosis:  Multiple rib fractures [S22.49XA] Fall in home, initial encounter [W19.XXXA, Y70.929] Multiple rib fractures involving four or more ribs [S22.49XA] Patient Active Problem List   Diagnosis Date Noted  . Multiple rib fractures 10/06/2020   PCP:  Baxter Hire, MD Pharmacy:   [id number] [place] [id number]: [phone]: [phone]    Social Determinants of Health (SDOH) Interventions    Readmission Risk Interventions No flowsheet data found.

## 2020-10-08 NOTE — NC FL2 (Addendum)
[place] LEVEL OF CARE SCREENING TOOL     IDENTIFICATION  Patient Name: Kelsey Haynes Birthdate: [birth year] Sex: female Admission Date (Current Location): 10/06/2020  [place] and [place] Number:  Engineering geologist and Address:  [facility], [address], [place], [place] [address]      Provider Number: [id number]  Attending Physician Name and Address:  Shelly Coss, MD  Relative Name and Phone Number:  Charnette Younkin [phone]    Current Level of Care: Hospital Recommended Level of Care: [place] Prior Approval Number:    Date Approved/Denied:   PASRR Number:    Discharge Plan: SNF    Current Diagnoses: Patient Active Problem List   Diagnosis Date Noted  . Multiple rib fractures 10/06/2020    Orientation RESPIRATION BLADDER Height & Weight     Self, Time, Situation, Place  Normal External catheter Weight: 68 kg Height:  4\' 11"  (149.9 cm)  BEHAVIORAL SYMPTOMS/MOOD NEUROLOGICAL BOWEL NUTRITION STATUS      Continent Diet (Carb Modified)  AMBULATORY STATUS COMMUNICATION OF NEEDS Skin   Extensive Assist Verbally Normal, Bruising                       Personal Care Assistance Level of Assistance  Dressing, Feeding, Bathing Bathing Assistance: Limited assistance Feeding assistance: Independent Dressing Assistance: Limited assistance     Functional Limitations Info  Speech, Hearing, Sight Sight Info: Adequate Hearing Info: Adequate Speech Info: Adequate    SPECIAL CARE FACTORS FREQUENCY  PT (By licensed PT), OT (By licensed OT)                    Contractures Contractures Info: Not present    Additional Factors Info  Code Status, Allergies Code Status Info: Full Allergies Info: Hazel Nuts           Current Medications (10/08/2020):  This is the current hospital active medication list Current Facility-Administered Medications  Medication Dose Route Frequency Provider Last  Rate Last Admin  . 0.9 %  sodium chloride infusion  250 mL Intravenous PRN Mansy, Jan A, MD      . acetaminophen (TYLENOL) tablet 650 mg  650 mg Oral Q6H PRN Mansy, Jan A, MD   650 mg at 10/07/20 0829   Or  . acetaminophen (TYLENOL) suppository 650 mg  650 mg Rectal Q6H PRN Mansy, Jan A, MD      . amLODipine (NORVASC) tablet 10 mg  10 mg Oral Daily Shelly Coss, MD   10 mg at 10/08/20 0835  . aspirin chewable tablet 81 mg  81 mg Oral Daily Mansy, Jan A, MD   81 mg at 10/08/20 0835  . enoxaparin (LOVENOX) injection 40 mg  40 mg Subcutaneous Q24H Mansy, Jan A, MD   40 mg at 10/07/20 2109  . gabapentin (NEURONTIN) capsule 100 mg  100 mg Oral TID Mansy, Jan A, MD   100 mg at 10/08/20 0835  . ibuprofen (ADVIL) tablet 200 mg  200 mg Oral Q4H PRN Mansy, Jan A, MD   200 mg at 10/08/20 1217  . insulin aspart (novoLOG) injection 0-9 Units  0-9 Units Subcutaneous TID PC & HS Mansy, Jan A, MD      . labetalol (NORMODYNE) injection 20 mg  20 mg Intravenous Q3H PRN Mansy, Jan A, MD      . lidocaine (LIDODERM) 5 % 1 patch  1 patch Transdermal Q24H Mansy, Arvella Merles, MD   1 patch  at 10/08/20 0611  . magnesium hydroxide (MILK OF MAGNESIA) suspension 30 mL  30 mL Oral Daily PRN Mansy, Jan A, MD      . melatonin tablet 5 mg  5 mg Oral QHS PRN Mansy, Jan A, MD      . morphine 2 MG/ML injection 2 mg  2 mg Intravenous Q2H PRN Mansy, Jan A, MD   2 mg at 10/07/20 1142  . multivitamin with minerals tablet 1 tablet  1 tablet Oral Daily Mansy, Jan A, MD   1 tablet at 10/08/20 0835  . ondansetron (ZOFRAN) tablet 4 mg  4 mg Oral Q6H PRN Mansy, Jan A, MD       Or  . ondansetron [facility]) injection 4 mg  4 mg Intravenous Q6H PRN Mansy, Jan A, MD      . oxyCODONE (Oxy IR/ROXICODONE) immediate release tablet 5 mg  5 mg Oral Q6H PRN Shelly Coss, MD   5 mg at 10/08/20 0208  . pantoprazole (PROTONIX) EC tablet 40 mg  40 mg Oral Daily Mansy, Jan A, MD   40 mg at 10/08/20 0835  . sodium chloride flush (NS) 0.9 % injection 3 mL  3  mL Intravenous Q12H Mansy, Jan A, MD   3 mL at 10/08/20 0852  . sodium chloride flush (NS) 0.9 % injection 3 mL  3 mL Intravenous PRN Mansy, Jan A, MD      . traZODone (DESYREL) tablet 25 mg  25 mg Oral QHS PRN Mansy, Arvella Merles, MD         Discharge Medications: Please see discharge summary for a list of discharge medications.  Relevant Imaging Results:  Relevant Lab Results:   Additional Information SS# [id number]  Shelbie Ammons, RN

## 2020-10-08 NOTE — Progress Notes (Signed)
Physical Therapy Treatment Patient Details Name: Kelsey Haynes MRN: [id number] DOB: Sep 03, 1932 Today's Date: 10/08/2020    History of Present Illness Kelsey Haynes  is a 86 y.o. Caucasian female with a known history of type 2 diabetes mellitus, hypertension, depression and breast cancer, presented to [facility] after a  mechanical fall.  Patient change her shoes and later on tripped on her own feet per her report with subsequent fall on her left side.  She had subsequent left-sided rib pain that was initially severe. Ribs x-ray showed nondisplaced fractures involving the left lateral third through ninth ribs.    PT Comments    Pt was very lethargic with spouse at bedside upon arriving. She agrees to session with minimal encouragement. Only oriented to self. Per Rn, no narcotics have been given this date and pain did not limit session progression. She was able to exit L side of bed with increased time and mod A. Stood to [employer] with mod assist. Needs constant vcs and assistance to ambulate ~ 10 ft total. Very slow step to gait pattern with shuffling step pattern. Poor foot clearance with inconsistently step length. She is at high fall risk. Overall, session continued to be limited by cognition more so than pain or physical limitations. Recommend SNF at DC to assist pt with returning to PLOF.Acute PT will continue to follow per current POC. At conclusion of session; pt was in bed, with bed alarm set, call bell in reach, and spouse at bedside.     Follow Up Recommendations  SNF     Equipment Recommendations  Rolling walker with 5" wheels (spouse reports only having 4WW/rollator)    Recommendations for Other Services       Precautions / Restrictions Precautions Precautions: Fall Precaution Comments: rib fxs Restrictions Weight Bearing Restrictions: No    Mobility  Bed Mobility Overal bed mobility: Needs Assistance Bed Mobility: Supine to Sit;Sit to Supine     Supine to sit: Mod  assist;Max assist;HOB elevated Sit to supine: Max assist   General bed mobility comments: pt required increased time to exit L side of bed.TC/Vc s for progression, sequencing, and safety.  Transfers Overall transfer level: Needs assistance Equipment used: Rolling walker (2 wheeled) Transfers: Sit to/from Stand Sit to Stand: Mod assist;From elevated surface         General transfer comment: pt required mod assist to stand form slightly elevated bed height. pt struggles with initiation of movements. poor motor planning throughout session.  Ambulation/Gait Ambulation/Gait assistance: Min assist Gait Distance (Feet): 10 Feet Assistive device: Rolling walker (2 wheeled) Gait Pattern/deviations: Decreased step length - right;Decreased step length - left;Step-through pattern Gait velocity: decreased   General Gait Details: Pt ambulated with RW ~ 10 ft. limited by fatigue and lethargy. pt very slow moving and requires constant assist + vcs for advancement.       Balance Overall balance assessment: Needs assistance Sitting-balance support: Bilateral upper extremity supported;Feet supported Sitting balance-Leahy Scale: Good Sitting balance - Comments: no LOB in sitting   Standing balance support: Bilateral upper extremity supported Standing balance-Leahy Scale: Fair Standing balance comment: poor posture and constant assist for safety, reliant on RW for balance       Cognition Arousal/Alertness: Lethargic Behavior During Therapy:  (lethargic and disoriented/confused) Overall Cognitive Status: No family/caregiver present to determine baseline cognitive functioning      General Comments: Pt's spouse at bedside states cognition is slightly improved today from yesterday but still off from baseline. pt only oriented to self  but is able to follow commands inconsistently              Pertinent Vitals/Pain Pain Assessment: Faces Faces Pain Scale: Hurts little more Pain Location: L  hip/side Pain Descriptors / Indicators: Aching;Sore Pain Intervention(s): Limited activity within patient's tolerance;Monitored during session;Repositioned           PT Goals (current goals can now be found in the care plan section) Acute Rehab PT Goals Patient Stated Goal: none stated Progress towards PT goals: Progressing toward goals (cognition deficits limit progression)    Frequency    Min 2X/week      PT Plan Current plan remains appropriate       AM-PAC PT "6 Clicks" Mobility   Outcome Measure  Help needed turning from your back to your side while in a flat bed without using bedrails?: A Lot Help needed moving from lying on your back to sitting on the side of a flat bed without using bedrails?: A Lot Help needed moving to and from a bed to a chair (including a wheelchair)?: A Lot Help needed standing up from a chair using your arms (e.g., wheelchair or bedside chair)?: A Lot Help needed to walk in hospital room?: A Lot Help needed climbing 3-5 steps with a railing? : A Lot 6 Click Score: 12    End of Session Equipment Utilized During Treatment: Gait belt Activity Tolerance: Patient limited by fatigue Patient left: in bed;with call bell/phone within reach;with bed alarm set Nurse Communication: Mobility status PT Visit Diagnosis: Unsteadiness on feet (R26.81);Muscle weakness (generalized) (M62.81)     Time: 5747-3403 PT Time Calculation (min) (ACUTE ONLY): 28 min  Charges:  $Gait Training: 8-22 mins $Therapeutic Activity: 8-22 mins                     Julaine Fusi PTA 10/08/20, 3:46 PM

## 2020-10-08 NOTE — Progress Notes (Addendum)
PROGRESS NOTE    Kelsey Haynes  BWG:[id number] DOB: Oct 08, 1932 DOA: 10/06/2020 PCP: Baxter Hire, MD   Chief Complain: Fall, chest pain  Brief Narrative:  Patient is a 86 year old female with history of type 2 diabetes mellitus, hypertension, depression, breast cancer who was brought to the emergency department after she fell.  She fell when she was trying to change her shoes, falling on the left side.  After the fall, she started having left-sided chest pain and difficulty taking deep breath.  On presentation she was mildly hypertensive.  Imagings done in the emergency room did not show any hip fractures but showed left-sided mildly displaced  fractures involving the third through ninth ribs.  She has history of chronic falls.  Patient was admitted for the evaluation of PT/OT and pain control.  PT/OT recommended skilled nursing facility on discharge.  TOC consulted.  Hospital course remarkable for delirium/confusion. She is currently waiting for skilled nursing facility placement.   Assessment & Plan:   Active Problems:   Multiple rib fractures   Fall/left-sided multiple rib fractures: Chest x-ray as above. She does not complain of pain this morning.   I have encouraged the patient to use incentive spirometer to prevent atelectasis. Patient reports history of chronic falls at home.  She lives with her husband.  She moves with the with the help of walker/cane at baseline.  PT/OT consulted who recommended skilled nursing facility on discharge.  Confusion: No history of dementia.  But patient appeared confused today.  Not agitated, sitting on the chair.  She said she was in a beauty parlor.  This is delirium which could have been triggered due to new environment, or pain medications.  We will try to minimize the narcotics.  Will frequently try to reorient her.  I have requested the RN to keep the room bright with daylight .  Continue melatonin.  We can use Haldol in case of severe  agitation but I would not restart it now.  If she continues to remain confused or agitated, we can consider putting her on low-dose Seroquel.  We will continue to monitor mental status.  Low suspicion for metabolic causes for her confusion. As per the husband, she is mostly oriented but confused sometimes.  Hypertension: No history of hypertension and does not take any medications at home.  She was hypertensive on presentation and has remained mildly hypertensive .Started on amlodipine.  Continue as needed medications for severe hypertension.  Type 2 diabetes mellitus with peripheral neuropathy: Continue sliding scale insulin.  Resume her home medications on discharge.  Neurontin for neuropathy  GERD: Takes PPI at home.          DVT prophylaxis:Lovenox Code Status: Full Family Communication: Called and discussed with  husband on phone on 10/08/2020.   Status is: Inpatient   Dispo: The patient is from: Home              Anticipated d/c is to: SNF              Anticipated d/c date is: 1-2 days              Patient currently is medically stable to d/c.      Consultants: None  Procedures:None  Antimicrobials:  Anti-infectives (From admission, onward)   None      Subjective: Patient seen and examined at the bedside this morning.  Appears comfortable.  Hypotensive this morning.  Denies any significant pain on the left chest.  Completely confused today  but not agitated and follows commands.  Objective: Vitals:   10/07/20 2001 10/07/20 2334 10/08/20 0505 10/08/20 0743  BP: (!) 165/86 (!) 161/76 (!) 153/82 (!) 179/78  Pulse: 87 96 (!) 106 98  Resp: 15 16 15 18   Temp: 98.3 F (36.8 C) 99 F (37.2 C) 98.9 F (37.2 C) (!) 100.8 F (38.2 C)  TempSrc: Oral Oral Oral Oral  SpO2: 94% 93% (!) 85% 95%  Weight:      Height:        Intake/Output Summary (Last 24 hours) at 10/08/2020 0747 Last data filed at 10/07/2020 1853 Gross per 24 hour  Intake 0 ml  Output --  Net 0  ml   Filed Weights   10/06/20 1720  Weight: 68 kg    Examination:  General exam: Pleasantly confused elderly female HEENT:PERRL,Oral mucosa moist, Ear/Nose normal on gross exam Respiratory system: Bilateral equal air entry, normal vesicular breath sounds, no wheezes or crackles .  Tenderness on the left chest Cardiovascular system: S1 & S2 heard, RRR. No JVD, murmurs, rubs, gallops or clicks. Gastrointestinal system: Abdomen is nondistended, soft and nontender. No organomegaly or masses felt. Normal bowel sounds heard. Central nervous system: Alert and awake but not oriented, obeys commands Extremities: No edema, no clubbing ,no cyanosis Skin: No rashes, lesions or ulcers,no icterus ,no pallor    Data Reviewed: I have personally reviewed following labs and imaging studies  CBC: Recent Labs  Lab 10/06/20 2015 10/07/20 0451  WBC 9.5 7.1  NEUTROABS 7.6  --   HGB 13.4 12.9  HCT 39.9 39.0  MCV 86.9 87.1  PLT 157 242   Basic Metabolic Panel: Recent Labs  Lab 10/06/20 2015 10/07/20 0451  NA 138 137  K 4.6 3.9  CL 102 100  CO2 23 26  GLUCOSE 198* 158*  BUN 22 21  CREATININE 1.03* 0.92  CALCIUM 8.8* 8.4*   GFR: Estimated Creatinine Clearance: 36.8 mL/min (by C-G formula based on SCr of 0.92 mg/dL). Liver Function Tests: No results for input(s): AST, ALT, ALKPHOS, BILITOT, PROT, ALBUMIN in the last 168 hours. No results for input(s): LIPASE, AMYLASE in the last 168 hours. No results for input(s): AMMONIA in the last 168 hours. Coagulation Profile: No results for input(s): INR, PROTIME in the last 168 hours. Cardiac Enzymes: No results for input(s): CKTOTAL, CKMB, CKMBINDEX, TROPONINI in the last 168 hours. BNP (last 3 results) No results for input(s): PROBNP in the last 8760 hours. HbA1C: Recent Labs    10/07/20 0451  HGBA1C 6.5*   CBG: Recent Labs  Lab 10/07/20 0813 10/07/20 1118 10/07/20 1608 10/07/20 2131 10/08/20 0744  GLUCAP 181* 157* 116* 150*  140*   Lipid Profile: No results for input(s): CHOL, HDL, LDLCALC, TRIG, CHOLHDL, LDLDIRECT in the last 72 hours. Thyroid Function Tests: No results for input(s): TSH, T4TOTAL, FREET4, T3FREE, THYROIDAB in the last 72 hours. Anemia Panel: No results for input(s): VITAMINB12, FOLATE, FERRITIN, TIBC, IRON, RETICCTPCT in the last 72 hours. Sepsis Labs: No results for input(s): PROCALCITON, LATICACIDVEN in the last 168 hours.  Recent Results (from the past 240 hour(s))  Respiratory Panel by RT PCR (Flu A&B, Covid) - Nasopharyngeal Swab     Status: None   Collection Time: 10/06/20  8:15 PM   Specimen: Nasopharyngeal Swab  Result Value Ref Range Status   SARS Coronavirus 2 by RT PCR NEGATIVE NEGATIVE Final    Comment: (NOTE) SARS-CoV-2 target nucleic acids are NOT DETECTED.  The SARS-CoV-2 RNA is generally detectable in  upper respiratoy specimens during the acute phase of infection. The lowest concentration of SARS-CoV-2 viral copies this assay can detect is 131 copies/mL. A negative result does not preclude SARS-Cov-2 infection and should not be used as the sole basis for treatment or other patient management decisions. A negative result may occur with  improper specimen collection/handling, submission of specimen other than nasopharyngeal swab, presence of viral mutation(s) within the areas targeted by this assay, and inadequate number of viral copies (<131 copies/mL). A negative result must be combined with clinical observations, patient history, and epidemiological information. The expected result is Negative.  Fact Sheet for Patients:  PinkCheek.be  Fact Sheet for Healthcare Providers:  GravelBags.it  This test is no t yet approved or cleared by the [place] FDA and  has been authorized for detection and/or diagnosis of SARS-CoV-2 by FDA under an Emergency Use Authorization (EUA). This EUA will remain  in effect  (meaning this test can be used) for the duration of the COVID-19 declaration under Section 564(b)(1) of the Act, 21 U.S.C. section 360bbb-3(b)(1), unless the authorization is terminated or revoked sooner.     Influenza A by PCR NEGATIVE NEGATIVE Final   Influenza B by PCR NEGATIVE NEGATIVE Final    Comment: (NOTE) The Xpert Xpress SARS-CoV-2/FLU/RSV assay is intended as an aid in  the diagnosis of influenza from Nasopharyngeal swab specimens and  should not be used as a sole basis for treatment. Nasal washings and  aspirates are unacceptable for Xpert Xpress SARS-CoV-2/FLU/RSV  testing.  Fact Sheet for Patients: PinkCheek.be  Fact Sheet for Healthcare Providers: GravelBags.it  This test is not yet approved or cleared by the [place] FDA and  has been authorized for detection and/or diagnosis of SARS-CoV-2 by  FDA under an Emergency Use Authorization (EUA). This EUA will remain  in effect (meaning this test can be used) for the duration of the  Covid-19 declaration under Section 564(b)(1) of the Act, 21  U.S.C. section 360bbb-3(b)(1), unless the authorization is  terminated or revoked. Performed at [facility], [address], [place], [place] [address]          Radiology Studies: DG Ribs Unilateral W/Chest Left  Result Date: 10/06/2020 CLINICAL DATA:  Fall.  Left chest and rib pain. EXAM: LEFT RIBS AND CHEST - 3+ VIEW COMPARISON:  Chest radiograph on 05/10/2020 FINDINGS: Mildly displaced fractures are seen involving the left lateral 3rd through 9th ribs. No evidence of pneumothorax or hemothorax. Both lungs are clear. IMPRESSION: Mildly displaced fractures involving the left lateral 3rd through 9th ribs. No evidence of pneumothorax or hemothorax. Electronically Signed   By: Marlaine Hind M.D.   On: 10/06/2020 19:25   DG Hip Unilat W or Wo Pelvis 2-3 Views Left  Result Date: 10/06/2020 CLINICAL DATA:   Left hip pain status post fall. EXAM: DG HIP (WITH OR WITHOUT PELVIS) 2-3V LEFT COMPARISON:  None. FINDINGS: There is no acute displaced fracture or dislocation. There are degenerative changes bilaterally. IMPRESSION: No acute displaced fracture or dislocation. Electronically Signed   By: Constance Holster M.D.   On: 10/06/2020 22:54        Scheduled Meds: . amLODipine  10 mg Oral Daily  . aspirin  81 mg Oral Daily  . enoxaparin (LOVENOX) injection  40 mg Subcutaneous Q24H  . gabapentin  100 mg Oral TID  . insulin aspart  0-9 Units Subcutaneous TID PC & HS  . lidocaine  1 patch Transdermal Q24H  . multivitamin with minerals  1 tablet Oral Daily  . pantoprazole  40 mg Oral Daily  . sodium chloride flush  3 mL Intravenous Q12H   Continuous Infusions: . sodium chloride       LOS: 1 day    Time spent: 25 mins,More than 50% of that time was spent in counseling and/or coordination of care.      Shelly Coss, MD [facility] P10/11/2020, 7:47 AM

## 2020-10-09 ENCOUNTER — Encounter: Payer: Self-pay | Admitting: Internal Medicine

## 2020-10-09 DIAGNOSIS — S2242XD Multiple fractures of ribs, left side, subsequent encounter for fracture with routine healing: Secondary | ICD-10-CM

## 2020-10-09 LAB — CBC WITH DIFFERENTIAL/PLATELET
Abs Immature Granulocytes: 0.02 10*3/uL (ref 0.00–0.07)
Basophils Absolute: 0 10*3/uL (ref 0.0–0.1)
Basophils Relative: 0 %
Eosinophils Absolute: 0.1 10*3/uL (ref 0.0–0.5)
Eosinophils Relative: 1 %
HCT: 37.6 % (ref 36.0–46.0)
Hemoglobin: 12.8 g/dL (ref 12.0–15.0)
Immature Granulocytes: 0 %
Lymphocytes Relative: 18 %
Lymphs Abs: 1.4 10*3/uL (ref 0.7–4.0)
MCH: 29 pg (ref 26.0–34.0)
MCHC: 34 g/dL (ref 30.0–36.0)
MCV: 85.1 fL (ref 80.0–100.0)
Monocytes Absolute: 1 10*3/uL (ref 0.1–1.0)
Monocytes Relative: 13 %
Neutro Abs: 5.4 10*3/uL (ref 1.7–7.7)
Neutrophils Relative %: 68 %
Platelets: 146 10*3/uL — ABNORMAL LOW (ref 150–400)
RBC: 4.42 MIL/uL (ref 3.87–5.11)
RDW: 12 % (ref 11.5–15.5)
WBC: 7.9 10*3/uL (ref 4.0–10.5)
nRBC: 0 % (ref 0.0–0.2)

## 2020-10-09 LAB — BASIC METABOLIC PANEL
Anion gap: 10 (ref 5–15)
BUN: 17 mg/dL (ref 8–23)
CO2: 29 mmol/L (ref 22–32)
Calcium: 8.6 mg/dL — ABNORMAL LOW (ref 8.9–10.3)
Chloride: 97 mmol/L — ABNORMAL LOW (ref 98–111)
Creatinine, Ser: 0.87 mg/dL (ref 0.44–1.00)
GFR, Estimated: >60 mL/min (ref >60)
Glucose, Bld: 157 mg/dL — ABNORMAL HIGH (ref 70–99)
Potassium: 3.7 mmol/L (ref 3.5–5.1)
Sodium: 136 mmol/L (ref 135–145)

## 2020-10-09 LAB — GLUCOSE, CAPILLARY
Glucose-Capillary: 162 mg/dL — ABNORMAL HIGH (ref 70–99)
Glucose-Capillary: 153 mg/dL — ABNORMAL HIGH (ref 70–99)
Glucose-Capillary: 136 mg/dL — ABNORMAL HIGH (ref 70–99)
Glucose-Capillary: 179 mg/dL — ABNORMAL HIGH (ref 70–99)

## 2020-10-09 NOTE — Progress Notes (Signed)
Progress Note    Kelsey Haynes  YTK:[id number] DOB: 07/08/1932  DOA: 10/06/2020 PCP: Baxter Hire, MD      Brief Narrative:    Medical records reviewed and are as summarized below:  Kelsey Haynes is a 86 y.o. female with history of type 2 diabetes mellitus, hypertension, depression, breast cancer who was brought to the emergency department after she fell.  She fell when she was trying to change her shoes, falling on the left side.  After the fall, she started having left-sided chest pain and difficulty taking deep breath.  On presentation she was mildly hypertensive.  Imagings done in the emergency room did not show any hip fractures but showed left-sided mildly displaced  fractures involving the third through ninth ribs.  She has history of chronic falls.  Patient was admitted for the evaluation of PT/OT and pain control.  PT/OT recommended skilled nursing facility on discharge.  TOC consulted.  Hospital course remarkable for delirium/confusion.      Assessment/Plan:   Active Problems:   Multiple rib fractures   Body mass index is 30.3 kg/m.  (Obesity)   Fall/left-sided multiple rib fractures:  Analgesics as needed for pain.  PT and OT recommended further rehabilitation and skilled nursing facility.  Confusion/delirium:  She is forgetful and does not remember that she had spoken to the social worker previously.  She seems to be getting better.  Hypertension: She was started on amlodipine for this visit.  BP is better.  Continue amlodipine.  Type 2 diabetes mellitus with peripheral neuropathy:  Humalog as needed for hyperglycemia.  Continue Neurontin.   GERD:  Continue Protonix.      Diet Order            Diet Carb Modified Fluid consistency: Thin; Room service appropriate? Yes  Diet effective now                    Consultants:  None  Procedures:  None    Medications:   . amLODipine  10 mg Oral Daily  . aspirin  81 mg Oral  Daily  . enoxaparin (LOVENOX) injection  40 mg Subcutaneous Q24H  . gabapentin  100 mg Oral TID  . insulin aspart  0-9 Units Subcutaneous TID PC & HS  . lidocaine  1 patch Transdermal Q24H  . multivitamin with minerals  1 tablet Oral Daily  . pantoprazole  40 mg Oral Daily  . sodium chloride flush  3 mL Intravenous Q12H   Continuous Infusions: . sodium chloride       Anti-infectives (From admission, onward)   None             Family Communication/Anticipated D/C date and plan/Code Status   DVT prophylaxis: enoxaparin (LOVENOX) injection 40 mg Start: 10/07/20 2200     Code Status: Full Code  Family Communication: None Disposition Plan:    Status is: Inpatient  Remains inpatient appropriate because:Unsafe d/c plan   Dispo: The patient is from: Home              Anticipated d/c is to: SNF              Anticipated d/c date is: 1 day              Patient currently is medically stable to d/c.           Subjective:   C/o left-sided chest pain from rib fractures.  No shortness of breath.  Objective:  Vitals:   10/08/20 1000 10/08/20 1612 10/09/20 0025 10/09/20 0930  BP:  139/77 128/63 119/78  Pulse:  96 90 78  Resp:  17 16 16   Temp: 98.3 F (36.8 C) 98.5 F (36.9 C) 99.5 F (37.5 C) 98.6 F (37 C)  TempSrc: Oral Oral Oral Oral  SpO2:  94% 93% 99%  Weight:      Height:       No data found.   Intake/Output Summary (Last 24 hours) at 10/09/2020 1321 Last data filed at 10/09/2020 1018 Gross per 24 hour  Intake 480 ml  Output --  Net 480 ml   Filed Weights   10/06/20 1720  Weight: 68 kg    Exam:  GEN: NAD SKIN: No rash EYES: EOMI ENT: MMM CV: RRR PULM: CTA B ABD: soft, ND, NT, +BS CNS: AAO x 3, non focal EXT: No edema or tenderness MSK: Left anterior lateral chest wall tenderness   Data Reviewed:   I have personally reviewed following labs and imaging studies:  Labs: Labs show the following:   Basic Metabolic  Panel: Recent Labs  Lab 10/06/20 2015 10/06/20 2015 10/07/20 0451 10/09/20 0441  NA 138  --  137 136  K 4.6   < > 3.9 3.7  CL 102  --  100 97*  CO2 23  --  26 29  GLUCOSE 198*  --  158* 157*  BUN 22  --  21 17  CREATININE 1.03*  --  0.92 0.87  CALCIUM 8.8*  --  8.4* 8.6*   < > = values in this interval not displayed.   GFR Estimated Creatinine Clearance: 38.9 mL/min (by C-G formula based on SCr of 0.87 mg/dL). Liver Function Tests: No results for input(s): AST, ALT, ALKPHOS, BILITOT, PROT, ALBUMIN in the last 168 hours. No results for input(s): LIPASE, AMYLASE in the last 168 hours. No results for input(s): AMMONIA in the last 168 hours. Coagulation profile No results for input(s): INR, PROTIME in the last 168 hours.  CBC: Recent Labs  Lab 10/06/20 2015 10/07/20 0451 10/09/20 0441  WBC 9.5 7.1 7.9  NEUTROABS 7.6  --  5.4  HGB 13.4 12.9 12.8  HCT 39.9 39.0 37.6  MCV 86.9 87.1 85.1  PLT 157 156 146*   Cardiac Enzymes: No results for input(s): CKTOTAL, CKMB, CKMBINDEX, TROPONINI in the last 168 hours. BNP (last 3 results) No results for input(s): PROBNP in the last 8760 hours. CBG: Recent Labs  Lab 10/08/20 1141 10/08/20 1612 10/08/20 2115 10/09/20 0914 10/09/20 1216  GLUCAP 137* 244* 200* 162* 153*   D-Dimer: No results for input(s): DDIMER in the last 72 hours. Hgb A1c: Recent Labs    10/07/20 0451  HGBA1C 6.5*   Lipid Profile: No results for input(s): CHOL, HDL, LDLCALC, TRIG, CHOLHDL, LDLDIRECT in the last 72 hours. Thyroid function studies: No results for input(s): TSH, T4TOTAL, T3FREE, THYROIDAB in the last 72 hours.  Invalid input(s): FREET3 Anemia work up: No results for input(s): VITAMINB12, FOLATE, FERRITIN, TIBC, IRON, RETICCTPCT in the last 72 hours. Sepsis Labs: Recent Labs  Lab 10/06/20 2015 10/07/20 0451 10/09/20 0441  WBC 9.5 7.1 7.9    Microbiology Recent Results (from the past 240 hour(s))  Respiratory Panel by RT PCR (Flu  A&B, Covid) - Nasopharyngeal Swab     Status: None   Collection Time: 10/06/20  8:15 PM   Specimen: Nasopharyngeal Swab  Result Value Ref Range Status   SARS Coronavirus 2 by RT PCR NEGATIVE NEGATIVE  Final    Comment: (NOTE) SARS-CoV-2 target nucleic acids are NOT DETECTED.  The SARS-CoV-2 RNA is generally detectable in upper respiratoy specimens during the acute phase of infection. The lowest concentration of SARS-CoV-2 viral copies this assay can detect is 131 copies/mL. A negative result does not preclude SARS-Cov-2 infection and should not be used as the sole basis for treatment or other patient management decisions. A negative result may occur with  improper specimen collection/handling, submission of specimen other than nasopharyngeal swab, presence of viral mutation(s) within the areas targeted by this assay, and inadequate number of viral copies (<131 copies/mL). A negative result must be combined with clinical observations, patient history, and epidemiological information. The expected result is Negative.  Fact Sheet for Patients:  PinkCheek.be  Fact Sheet for Healthcare Providers:  GravelBags.it  This test is no t yet approved or cleared by the [place] FDA and  has been authorized for detection and/or diagnosis of SARS-CoV-2 by FDA under an Emergency Use Authorization (EUA). This EUA will remain  in effect (meaning this test can be used) for the duration of the COVID-19 declaration under Section 564(b)(1) of the Act, 21 U.S.C. section 360bbb-3(b)(1), unless the authorization is terminated or revoked sooner.     Influenza A by PCR NEGATIVE NEGATIVE Final   Influenza B by PCR NEGATIVE NEGATIVE Final    Comment: (NOTE) The Xpert Xpress SARS-CoV-2/FLU/RSV assay is intended as an aid in  the diagnosis of influenza from Nasopharyngeal swab specimens and  should not be used as a sole basis for treatment. Nasal  washings and  aspirates are unacceptable for Xpert Xpress SARS-CoV-2/FLU/RSV  testing.  Fact Sheet for Patients: PinkCheek.be  Fact Sheet for Healthcare Providers: GravelBags.it  This test is not yet approved or cleared by the [place] FDA and  has been authorized for detection and/or diagnosis of SARS-CoV-2 by  FDA under an Emergency Use Authorization (EUA). This EUA will remain  in effect (meaning this test can be used) for the duration of the  Covid-19 declaration under Section 564(b)(1) of the Act, 21  U.S.C. section 360bbb-3(b)(1), unless the authorization is  terminated or revoked. Performed at [facility], [address] [place], [place] [address]     Procedures and diagnostic studies:  No results found.             LOS: 2 days   Ferris Fielden  [facility]   Pager on [email or web address]. If 7PM-7AM, please contact night-coverage at [email or web address]     10/09/2020, 1:21 PM

## 2020-10-09 NOTE — Progress Notes (Signed)
Physical Therapy Treatment Patient Details Name: Kelsey Haynes MRN: [id number] DOB: March 09, 1932 Today's Date: 10/09/2020    History of Present Illness Kelsey Haynes  is a 86 y.o. Caucasian female with a known history of type 2 diabetes mellitus, hypertension, depression and breast cancer, presented to [facility] after a  mechanical fall.  Patient change her shoes and later on tripped on her own feet per her report with subsequent fall on her left side.  She had subsequent left-sided rib pain that was initially severe. Ribs x-ray showed nondisplaced fractures involving the left lateral third through ninth ribs.    PT Comments    Pt tolerated treatment well today but continues to be limited due to increased pain levels with mobility. However, pt able to improve overall assistance levels, activity tolerance, and ambulation distance since last session. Pt grossly required mod assist for bed mobility, CGA for transfers with RW, and CGA for short distance ambulation. Increased pain behaviors and guarded noted with all mobility and movement of LLE. Pt with complaints of lightheadedness while seated EOB that subsided with time and cues for pursed lip breathing. Pt will continue to benefit from skilled acute PT services to address deficits for return to baseline function.   Follow Up Recommendations  SNF     Equipment Recommendations  Rolling walker with 5" wheels    Recommendations for Other Services       Precautions / Restrictions Precautions Precautions: Fall Precaution Comments: rib fxs Restrictions Weight Bearing Restrictions: No    Mobility  Bed Mobility Overal bed mobility: Needs Assistance Bed Mobility: Supine to Sit     Supine to sit: Mod assist;HOB elevated     General bed mobility comments: Mod assist for hip and trunk facilitation to sit EOB with HOB elevated. Multimodal cues for hand/BLE placement to facilitate transfer. Required increased time/effort and  demonstrated increased pain behaviors.  Transfers Overall transfer level: Needs assistance Equipment used: Rolling walker (2 wheeled) Transfers: Sit to/from Stand;Anterior-Posterior Transfer Sit to Stand: Min guard;From elevated surface     Anterior-Posterior transfers: Max assist   General transfer comment: CGA for safety to stand from elevated bed height with RW; verbal cues for hand placement. Max assist for anterior scooting towards EOB.  Ambulation/Gait Ambulation/Gait assistance: Min guard Gait Distance (Feet): 12 Feet Assistive device: Rolling walker (2 wheeled)   Gait velocity: decreased   General Gait Details: Pt required CGA for safety to ambulate short distance with RW. Pt demonstrated slowed cadence, early reciprocal gait, decreased L stance, and decreased R foot clearance/step length.   Stairs             Wheelchair Mobility    Modified Rankin (Stroke Patients Only)       Balance Overall balance assessment: Needs assistance Sitting-balance support: Bilateral upper extremity supported;Feet supported Sitting balance-Leahy Scale: Good Sitting balance - Comments: no LOB in sitting   Standing balance support: Bilateral upper extremity supported;During functional activity Standing balance-Leahy Scale: Fair Standing balance comment: Fair standing balance with BUE support on RW                            Cognition Arousal/Alertness: Awake/alert Behavior During Therapy: WFL for tasks assessed/performed Overall Cognitive Status: Within Functional Limits for tasks assessed                                 General Comments: Pt  A&O x4, stating she recently learned she was at the hospital in [place]. Able to describe situation that brought her to hospital, and that she broke multiple L ribs.      Exercises Other Exercises Other Exercises: PT educated regardling pain management techniques including log roll for bed mobility and  pursed lip breathing. Other Exercises: Pt able to perform x15 BLE exercises including: heel slides, ankle pumps, and glute sets. Increased pain behaviors noted with LLE mobility.    General Comments        Pertinent Vitals/Pain Pain Assessment: 0-10 Pain Score: 6  Pain Location: L side Pain Descriptors / Indicators: Aching;Sore Pain Intervention(s): Limited activity within patient's tolerance;Monitored during session;Repositioned    Home Living                      Prior Function            PT Goals (current goals can now be found in the care plan section) Progress towards PT goals: Progressing toward goals    Frequency    Min 2X/week      PT Plan Current plan remains appropriate    Co-evaluation              AM-PAC PT "6 Clicks" Mobility   Outcome Measure  Help needed turning from your back to your side while in a flat bed without using bedrails?: A Lot Help needed moving from lying on your back to sitting on the side of a flat bed without using bedrails?: A Lot Help needed moving to and from a bed to a chair (including a wheelchair)?: A Little Help needed standing up from a chair using your arms (e.g., wheelchair or bedside chair)?: A Little Help needed to walk in hospital room?: A Little Help needed climbing 3-5 steps with a railing? : A Lot 6 Click Score: 15    End of Session Equipment Utilized During Treatment: Gait belt Activity Tolerance: Patient limited by pain Patient left: with call bell/phone within reach;with chair alarm set Nurse Communication: Mobility status PT Visit Diagnosis: Unsteadiness on feet (R26.81);Muscle weakness (generalized) (M62.81)     Time: 1132-1202 PT Time Calculation (min) (ACUTE ONLY): 30 min  Charges:  $Therapeutic Exercise: 8-22 mins $Therapeutic Activity: 8-22 mins                     Herminio Commons, PT, DPT 12:24 PM,10/09/20

## 2020-10-10 ENCOUNTER — Encounter
Admission: RE | Admit: 2020-10-10 | Discharge: 2020-10-10 | Disposition: A | Discharge status: Home / Self Care | Payer: Medicare Other | Source: Ambulatory Visit | Attending: Internal Medicine | Admitting: Internal Medicine

## 2020-10-10 DIAGNOSIS — S2242XD Multiple fractures of ribs, left side, subsequent encounter for fracture with routine healing: Secondary | ICD-10-CM | POA: Diagnosis not present

## 2020-10-10 LAB — GLUCOSE, CAPILLARY
Glucose-Capillary: 133 mg/dL — ABNORMAL HIGH (ref 70–99)
Glucose-Capillary: 205 mg/dL — ABNORMAL HIGH (ref 70–99)

## 2020-10-10 LAB — SARS CORONAVIRUS 2 BY RT PCR (HOSPITAL ORDER, PERFORMED IN ~~LOC~~ HOSPITAL LAB): SARS Coronavirus 2: NEGATIVE

## 2020-10-10 MED ORDER — LIDOCAINE 5 % EX PTCH: 1.0000 | MEDICATED_PATCH | CUTANEOUS | Status: AC

## 2020-10-10 MED ORDER — AMLODIPINE BESYLATE 10 MG PO TABS: 10.0000 mg | ORAL_TABLET | Freq: Every day | ORAL | Status: AC

## 2020-10-10 MED ORDER — GUAIFENESIN-DM 100-10 MG/5ML PO SYRP: 5.0000 mL | ORAL_SOLUTION | ORAL | Status: DC | PRN

## 2020-10-10 NOTE — Progress Notes (Signed)
Report given to snf

## 2020-10-10 NOTE — Progress Notes (Signed)
Pt taken via stretcher in apparent stable condition.

## 2020-10-10 NOTE — Discharge Summary (Signed)
Physician Discharge Summary  Kelsey Haynes FAO:[id number] DOB: [birth year] DOA: 10/06/2020  PCP: Baxter Hire, MD  Admit date: 10/06/2020 Discharge date: 10/10/2020  Discharge disposition: Skilled nursing facility   Recommendations for Outpatient Follow-Up:   Follow-up with your physician at the nursing home within 3 days of discharge   Discharge Diagnosis:   Active Problems:   Multiple rib fractures    Discharge Condition: Stable.  Diet recommendation:  Diet Order            Diet - low sodium heart healthy           Diet Carb Modified           Diet Carb Modified Fluid consistency: Thin; Room service appropriate? Yes  Diet effective now                   Code Status: Full Code     Hospital Course:   Ms. Kelsey Haynes is a 86 y.o. female with history of type 2 diabetes mellitus, hypertension, depression, breast cancer who was brought to the emergency department after she fell. She fell when she was trying to change her shoes, falling on the left side. After the fall, she started having left-sided chest pain and difficulty taking deep breath. Imagings done in the emergency room did not show any hipfractures but showed left-sided mildly displaced fractures involving the third through ninth ribs. She has history of chronic falls. Patient was admitted for the evaluation of PT/OT and pain control. PT/OT recommended skilled nursing facility on discharge.Hospital course was complicated by delirium/confusion.  She has also been started on amlodipine for hypertension.      Discharge Exam:    Vitals:   10/09/20 1538 10/09/20 2337 10/10/20 0746 10/10/20 0749  BP: (!) 144/72 (!) 151/68 135/83 (!) 154/67  Pulse: 93 81 83 94  Resp: 18 18 20 18   Temp: 98.6 F (37 C) 98.5 F (36.9 C) 98.2 F (36.8 C) 98.5 F (36.9 C)  TempSrc: Oral Oral Axillary Oral  SpO2: 95% 94% 98% 93%  Weight:      Height:         GEN: NAD SKIN: No rash EYES: EOMI ENT:  MMM CV: RRR PULM: CTA B ABD: soft, ND, NT, +BS CNS: AAO x 3, non focal EXT: No edema or tenderness MSK: Left lateral chest wall tenderness   The results of significant diagnostics from this hospitalization (including imaging, microbiology, ancillary and laboratory) are listed below for reference.     Procedures and Diagnostic Studies:   No results found.   Labs:   Basic Metabolic Panel: Recent Labs  Lab 10/06/20 2015 10/06/20 2015 10/07/20 0451 10/09/20 0441  NA 138  --  137 136  K 4.6   < > 3.9 3.7  CL 102  --  100 97*  CO2 23  --  26 29  GLUCOSE 198*  --  158* 157*  BUN 22  --  21 17  CREATININE 1.03*  --  0.92 0.87  CALCIUM 8.8*  --  8.4* 8.6*   < > = values in this interval not displayed.   GFR Estimated Creatinine Clearance: 38.9 mL/min (by C-G formula based on SCr of 0.87 mg/dL). Liver Function Tests: No results for input(s): AST, ALT, ALKPHOS, BILITOT, PROT, ALBUMIN in the last 168 hours. No results for input(s): LIPASE, AMYLASE in the last 168 hours. No results for input(s): AMMONIA in the last 168 hours. Coagulation profile No results for input(s): INR, PROTIME  in the last 168 hours.  CBC: Recent Labs  Lab 10/06/20 2015 10/07/20 0451 10/09/20 0441  WBC 9.5 7.1 7.9  NEUTROABS 7.6  --  5.4  HGB 13.4 12.9 12.8  HCT 39.9 39.0 37.6  MCV 86.9 87.1 85.1  PLT 157 156 146*   Cardiac Enzymes: No results for input(s): CKTOTAL, CKMB, CKMBINDEX, TROPONINI in the last 168 hours. BNP: Invalid input(s): POCBNP CBG: Recent Labs  Lab 10/09/20 0914 10/09/20 1216 10/09/20 1648 10/09/20 2056 10/10/20 0751  GLUCAP 162* 153* 136* 179* 133*   D-Dimer No results for input(s): DDIMER in the last 72 hours. Hgb A1c No results for input(s): HGBA1C in the last 72 hours. Lipid Profile No results for input(s): CHOL, HDL, LDLCALC, TRIG, CHOLHDL, LDLDIRECT in the last 72 hours. Thyroid function studies No results for input(s): TSH, T4TOTAL, T3FREE, THYROIDAB in  the last 72 hours.  Invalid input(s): FREET3 Anemia work up No results for input(s): VITAMINB12, FOLATE, FERRITIN, TIBC, IRON, RETICCTPCT in the last 72 hours. Microbiology Recent Results (from the past 240 hour(s))  Respiratory Panel by RT PCR (Flu A&B, Covid) - Nasopharyngeal Swab     Status: None   Collection Time: 10/06/20  8:15 PM   Specimen: Nasopharyngeal Swab  Result Value Ref Range Status   SARS Coronavirus 2 by RT PCR NEGATIVE NEGATIVE Final    Comment: (NOTE) SARS-CoV-2 target nucleic acids are NOT DETECTED.  The SARS-CoV-2 RNA is generally detectable in upper respiratoy specimens during the acute phase of infection. The lowest concentration of SARS-CoV-2 viral copies this assay can detect is 131 copies/mL. A negative result does not preclude SARS-Cov-2 infection and should not be used as the sole basis for treatment or other patient management decisions. A negative result may occur with  improper specimen collection/handling, submission of specimen other than nasopharyngeal swab, presence of viral mutation(s) within the areas targeted by this assay, and inadequate number of viral copies (<131 copies/mL). A negative result must be combined with clinical observations, patient history, and epidemiological information. The expected result is Negative.  Fact Sheet for Patients:  PinkCheek.be  Fact Sheet for Healthcare Providers:  GravelBags.it  This test is no t yet approved or cleared by the [place] FDA and  has been authorized for detection and/or diagnosis of SARS-CoV-2 by FDA under an Emergency Use Authorization (EUA). This EUA will remain  in effect (meaning this test can be used) for the duration of the COVID-19 declaration under Section 564(b)(1) of the Act, 21 U.S.C. section 360bbb-3(b)(1), unless the authorization is terminated or revoked sooner.     Influenza A by PCR NEGATIVE NEGATIVE Final    Influenza B by PCR NEGATIVE NEGATIVE Final    Comment: (NOTE) The Xpert Xpress SARS-CoV-2/FLU/RSV assay is intended as an aid in  the diagnosis of influenza from Nasopharyngeal swab specimens and  should not be used as a sole basis for treatment. Nasal washings and  aspirates are unacceptable for Xpert Xpress SARS-CoV-2/FLU/RSV  testing.  Fact Sheet for Patients: PinkCheek.be  Fact Sheet for Healthcare Providers: GravelBags.it  This test is not yet approved or cleared by the [place] FDA and  has been authorized for detection and/or diagnosis of SARS-CoV-2 by  FDA under an Emergency Use Authorization (EUA). This EUA will remain  in effect (meaning this test can be used) for the duration of the  Covid-19 declaration under Section 564(b)(1) of the Act, 21  U.S.C. section 360bbb-3(b)(1), unless the authorization is  terminated or revoked. Performed at [facility]  Lab, [place], [place] [address]   SARS Coronavirus 2 by RT PCR (hospital order, performed in [facility] hospital lab) Nasopharyngeal Nasopharyngeal Swab     Status: None   Collection Time: 10/10/20  7:45 AM   Specimen: Nasopharyngeal Swab  Result Value Ref Range Status   SARS Coronavirus 2 NEGATIVE NEGATIVE Final    Comment: (NOTE) SARS-CoV-2 target nucleic acids are NOT DETECTED.  The SARS-CoV-2 RNA is generally detectable in upper and lower respiratory specimens during the acute phase of infection. The lowest concentration of SARS-CoV-2 viral copies this assay can detect is 250 copies / mL. A negative result does not preclude SARS-CoV-2 infection and should not be used as the sole basis for treatment or other patient management decisions.  A negative result may occur with improper specimen collection / handling, submission of specimen other than nasopharyngeal swab, presence of viral mutation(s) within the areas targeted by this  assay, and inadequate number of viral copies (<250 copies / mL). A negative result must be combined with clinical observations, patient history, and epidemiological information.  Fact Sheet for Patients:   StrictlyIdeas.no  Fact Sheet for Healthcare Providers: BankingDealers.co.za  This test is not yet approved or  cleared by the [place] FDA and has been authorized for detection and/or diagnosis of SARS-CoV-2 by FDA under an Emergency Use Authorization (EUA).  This EUA will remain in effect (meaning this test can be used) for the duration of the COVID-19 declaration under Section 564(b)(1) of the Act, 21 U.S.C. section 360bbb-3(b)(1), unless the authorization is terminated or revoked sooner.  Performed at [facility], [address] [place],  [address]      Discharge Instructions:   Discharge Instructions    Diet - low sodium heart healthy   Complete by: As directed    Diet Carb Modified   Complete by: As directed    Increase activity slowly   Complete by: As directed      Allergies as of 10/10/2020      Reactions   Other Swelling, Other (See Comments)   Hazel nuts      Medication List    STOP taking these medications   AZO TABS PO   diclofenac sodium 1 % Gel Commonly known as: VOLTAREN   Hair Skin and Nails Formula Tabs   predniSONE 20 MG tablet Commonly known as: DELTASONE     TAKE these medications   acetaminophen 325 MG tablet Commonly known as: TYLENOL Take 650 mg by mouth every 6 (six) hours as needed for moderate pain or headache.   amLODipine 10 MG tablet Commonly known as: NORVASC Take 1 tablet (10 mg total) by mouth daily. Start taking on: October 11, 2020   aspirin 81 MG chewable tablet Chew 1 tablet by mouth daily.   gabapentin 100 MG capsule Commonly known as: NEURONTIN Take 200 mg in the morning, 100 mg in the afternoon, and 100 mg at night for one week, then  increase to 200 mg in the morning, 200 mg in the afternoon, and 100 mg at night for one week, then increase to 200 mg three times per day.   ibuprofen 200 MG tablet Commonly known as: ADVIL Take 200 mg by mouth every 6 (six) hours as needed.   lidocaine 5 % Commonly known as: LIDODERM Place 1 patch onto the skin daily for 5 days. Apply to left lateral chest wall below the left breast. Remove & Discard patch within 12 hours or as directed  by MD Start taking on: October 11, 2020   melatonin 3 MG Tabs tablet Take 1 tablet by mouth at bedtime.   Multi-Vitamins Tabs Take 1 tablet by mouth daily.   omeprazole 20 MG capsule Commonly known as: PRILOSEC Take 20 mg by mouth every morning.         Time coordinating discharge: 32 minutes  Signed:  Cathan Gearin  [facility] 10/10/2020, 10:50 AM   Pager on [email or web address]. If 7PM-7AM, please contact night-coverage at [email or web address]

## 2020-10-10 NOTE — Care Management Important Message (Signed)
Important Message  Patient Details  Name: Kelsey Haynes MRN: [id number] Date of Birth: 1932-06-19   Medicare Important Message Given:  N/A - LOS <3 / Initial given by admissions  Initial Medicare IM given by Meredith Mody, Patient Access Associate on 10/09/2020 at 9:21am.  Reviewed with patient per Vangie's notes.     Dannette Barbara 10/10/2020, 11:45 AM

## 2021-10-20 ENCOUNTER — Other Ambulatory Visit: Payer: Self-pay | Admitting: Internal Medicine

## 2021-10-20 DIAGNOSIS — K219 Gastro-esophageal reflux disease without esophagitis: Secondary | ICD-10-CM

## 2021-10-20 DIAGNOSIS — R1310 Dysphagia, unspecified: Secondary | ICD-10-CM

## 2021-10-30 ENCOUNTER — Ambulatory Visit
Admission: RE | Admit: 2021-10-30 | Discharge: 2021-10-30 | Disposition: A | Discharge status: Home / Self Care | Payer: Medicare Other | Source: Ambulatory Visit | Attending: Internal Medicine | Admitting: Internal Medicine

## 2021-10-30 ENCOUNTER — Other Ambulatory Visit: Payer: Self-pay

## 2021-10-30 DIAGNOSIS — K219 Gastro-esophageal reflux disease without esophagitis: Secondary | ICD-10-CM | POA: Diagnosis present

## 2021-10-30 DIAGNOSIS — R1310 Dysphagia, unspecified: Secondary | ICD-10-CM | POA: Diagnosis not present

## 2021-10-30 NOTE — Therapy (Addendum)
[facility] [address] [place], [place], [address] Phone: [phone]   Fax:     Modified Barium Swallow  Patient Details  Name: Kelsey Haynes MRN: [id number] Date of Birth: December 13, 1932 No data recorded  Encounter Date: 10/30/2021   End of Session - 10/30/21 1430     Visit Number 1    Number of Visits 1    Date for SLP Re-Evaluation 10/30/21    SLP Start Time 1300    SLP Stop Time  1400    SLP Time Calculation (min) 60 min    Activity Tolerance Patient tolerated treatment well             Past Medical History:  Diagnosis Date   Breast cancer ([place]) right breast   Cancer ([place]) 2006   breast cancer 2006   Depression    Diabetes mellitus without complication ([place])    Hypertension    Personal history of radiation therapy    PONV (postoperative nausea and vomiting)     Past Surgical History:  Procedure Laterality Date   ABDOMINAL HYSTERECTOMY     BREAST BIOPSY Right 2006   +   BREAST CYST ASPIRATION Right    CATARACT EXTRACTION W/PHACO Left 11/14/2019   Procedure: CATARACT EXTRACTION PHACO AND INTRAOCULAR LENS PLACEMENT ([place]) LEFT DIABETIC;  Surgeon: Birder Robson, MD;  Location: [facility];  Service: Ophthalmology;  Laterality: Left;  [place] 1:18 CDE 14.93   CATARACT EXTRACTION W/PHACO Right 12/05/2019   Procedure: CATARACT EXTRACTION PHACO AND INTRAOCULAR LENS PLACEMENT ([facility]) RIGHT DIABETIC 7.58, 00:52.3;  Surgeon: Birder Robson, MD;  Location: [facility];  Service: Ophthalmology;  Laterality: Right;   endoscopic carpal tunnel release     HALLUX VALGUS CORRECTION Left 1983   HERNIA REPAIR     x2   left foot surgery Left    LEFT HEART CATH AND CORONARY ANGIOGRAPHY Left 07/06/2019   Procedure: LEFT HEART CATH AND CORONARY ANGIOGRAPHY;  Surgeon: Yolonda Kida, MD;  Location: [facility];  Service: Cardiovascular;  Laterality: Left;   SHOULDER SURGERY Bilateral    SPINE  SURGERY     back    There were no vitals filed for this visit.     Subjective: Patient behavior: (alertness, ability to follow instructions, etc.): awake/alert; followed instructions. Verbally engaged. Pt resides at a [employer] [facility].  Pt endorsed ~6 months of decline in Memory; "changes in my memory". She requested written information. GERD dx per chart. She denied any h/o CVA nor pneumonia. No recent weight loss. Chief complaint: dysphagia  OM Exam: WFL w/ no unilateral weakness. Gag+. Darkened, "blackened" area on mid-posterior tongue -- pt stated occurs at home also.  Native Dentition    Objective:  Radiological Procedure: A videoflouroscopic evaluation of oral-preparatory, reflex initiation, and pharyngeal phases of the swallow was performed; as well as a screening of the upper esophageal phase.  POSTURE: upright VIEW: lateral COMPENSATORY STRATEGIES: lingual sweep and dry swallow when needed.  BOLUSES ADMINISTERED:  Thin Liquid: 2 tsps; 4 via cup  Nectar-thick Liquid: 1 tsp; 3 via cup  Honey-thick Liquid: NT  Puree: 3 trials  Mechanical Soft: 1 trial RESULTS OF EVALUATION: ORAL PREPARATORY PHASE: (The lips, tongue, and velum are observed for strength and coordination)       **Overall Severity Rating: grossly WFL. Pt exhibited a dark, "blackened" coating on her mid-posterior tongue at Baseline; she endorsed this occurred at home and that she uses a mouthwash, "[employer]", to "clean"  her mouth. Barium residue appeared to collect/coat the mid-posterior tongue(the "blackened" area) as the study continued. No lingual weakness appreciate; full ROM and oral clearing achieved w/ all consistencies; timely bolus management noted.   SWALLOW INITIATION/REFLEX: (The reflex is normal if "triggered" by the time the bolus reached the base of the tongue)  **Overall Severity Rating: WFL-Mild. Pharyngeal swallow appeared to trigger as thin liquids spilled to the pyriform sinuses. More significant  spilling/filling occurred x1. "Flash" laryngeal penetration of thin liquid trials occurred x2 which appeared to clear w/ completion of the swallows. Pharyngeal swallow appeared to trigger at the BOT/Valleculae w/ foods/nectar liquid consistencies.  PHARYNGEAL PHASE: (Pharyngeal function is normal if the bolus shows rapid, smooth, and continuous transit through the pharynx and there is no pharyngeal residue after the swallow)  **Overall Severity Rating: [facility]. No pharyngeal residue remained post swallows indicating adequate laryngeal pressure, BOT contract/strength, and laryngeal excursion during the swallows.   LARYNGEAL PENETRATION: (Material entering into the laryngeal inlet/vestibule but not aspirated): "flash" laryngeal penetration which appeared to clear w/ completion of the swallow -- NO bolus residue appeared to remain; NO buildup. This would be acceptable w/ pt's age. ASPIRATION: NONE ESOPHAGEAL PHASE: (Screening of the upper esophagus): brief viewing of the lower cervical esophagus revealing slow clearing of bolus trials(food)   ASSESSMENT: Pt appears to present w/ grossly adequate oropharyngeal phase swallow function in light of pt's age; pt endorsed "changes in my memory". No significant oropharyngeal phase dysphagia noted during this study. Pt has inconsistent delay in pharyngeal swallow initiation w/ thin liquids resulting in "flash" laryngeal penetration (inconsistent) w/ thin liquids -- this laryngeal penetration appeared to clear w/ completion of the swallow. NO aspiration was noted during the study; NO build-up of bolus residue in the laryngeal vestibule noted. This presentation would be commensurate w/ pt's age. OF NOTE, pt exhibited a darkened, "blackened" mid-posterior tongue; barium residue appeared to collect on the tongue in this area as trials continued(same area throughout the exam). Pt endorsed the "blackened" area on her tongue occurred often and that it felt "thick". She attempts  to clean it w/ mouthwash. She has NOT spoken to her MD about this. Unsure if related to any medications or to her Baseline GERD.   During this study, pt's pharyngeal swallow appeared to trigger as thin liquids spilled to the pyriform sinuses. More significant spilling/filling occurred x1 trial. "Flash" laryngeal penetration of thin liquid trials occurred x2 which appeared to clear w/ completion of the swallows. Pt was instructed on using Smaller sips which appeared to aid in bolus control and more timely pharyngeal swallow. Pharyngeal swallow appeared to trigger at the BOT/Valleculae w/ foods/nectar liquid consistencies. No pharyngeal residue remained post swallows indicating adequate laryngeal pressure, BOT contract/strength, and laryngeal excursion during the swallows. The oral phase was c/b appropriate lingual strength and full ROM; oral clearing achieved w/ all consistencies w/ timely bolus management and A-P transfer noted. Again, a min "coating" of barium was apparent on the mid-posterior tongue where the "blackened", dark area was noted(nowhere else in the oral cavity such as the buccal areas). Radiologist viewed this also and agreed. During the Esophageal phase, brief viewing of the lower cervical esophagus revealing slow clearing of bolus trials(food). Alternated foods/liquids to aid clearing.   Discussed the results of the MBSS, viewed the study w/ pt and instructed on Smaller sips when drinking liquids, encouraged Cup drinking for best control, and encouraged alternating foods/liquids to aid Esophageal phase motility. Encouraged pt to f/u w/ her PCP,  GI for assessment of the "blackened" area on her mid-posterior tongue; education on REFLUX/GERD as well. Pt gave verbal agreement.     PLAN/RECOMMENDATIONS:  A. Diet: Regular diet w/ more Mech Soft meats -- well-Cut/Chopped and moistened w/ gravies; Thin liquids.   B. Swallowing Precautions: general aspiration precautions; REFLUX/GERD precautions w/  continued use of PPI; Pills in a puree IF needed for ease of swallowing/clearing  C. Recommended consultation to: GI for formal assessment and management of GERD, PPI use. Also discuss the darkened, "blackened" area on mid-posterior tongue.   D. Therapy recommendations: NONE  E. Results and recommendations were discussed w/ patient; video reviewed and discussed; educated pt on general aspiration and REFLUX precautions w/ strategies to prep foods and monitor certain problematic foods. Highlighted ways to moisten foods, especially meats and breads. Suggested avoiding problematic foods and substituting w/ something else.           Dysphagia, unspecified type - Plan: DG SWALLOW FUNC OP MEDICARE SPEECH PATH, DG SWALLOW FUNC OP MEDICARE SPEECH PATH  GERD without esophagitis - Plan: DG SWALLOW FUNC OP MEDICARE SPEECH PATH, DG SWALLOW FUNC OP MEDICARE SPEECH PATH        Problem List Patient Active Problem List   Diagnosis Date Noted   Multiple rib fractures 10/06/2020           Orinda Kenner, [place], [place] Speech Language Pathologist [facility] [phone] [facility] 10/30/2021, 2:31 PM  [place], [place], [address] Phone: [phone]   Fax:     Name: Kelsey Haynes MRN: [id number] Date of Birth: [birth year]

## 2022-01-20 ENCOUNTER — Other Ambulatory Visit: Payer: Self-pay | Admitting: Internal Medicine

## 2022-01-20 DIAGNOSIS — R1312 Dysphagia, oropharyngeal phase: Secondary | ICD-10-CM

## 2022-01-26 ENCOUNTER — Other Ambulatory Visit: Payer: Self-pay

## 2022-01-26 ENCOUNTER — Ambulatory Visit
Admission: RE | Admit: 2022-01-26 | Discharge: 2022-01-26 | Disposition: A | Discharge status: Home / Self Care | Payer: Medicare Other | Source: Ambulatory Visit | Attending: Internal Medicine | Admitting: Internal Medicine

## 2022-01-26 DIAGNOSIS — R1312 Dysphagia, oropharyngeal phase: Secondary | ICD-10-CM | POA: Diagnosis present

## 2022-07-12 ENCOUNTER — Emergency Department
Admission: EM | Admit: 2022-07-12 | Discharge: 2022-07-12 | Disposition: A | Discharge status: Home / Self Care | Payer: Medicare Other | Attending: Emergency Medicine | Admitting: Emergency Medicine

## 2022-07-12 ENCOUNTER — Emergency Department: Payer: Medicare Other

## 2022-07-12 ENCOUNTER — Other Ambulatory Visit: Payer: Self-pay

## 2022-07-12 DIAGNOSIS — R0789 Other chest pain: Secondary | ICD-10-CM | POA: Insufficient documentation

## 2022-07-12 DIAGNOSIS — I10 Essential (primary) hypertension: Secondary | ICD-10-CM | POA: Diagnosis not present

## 2022-07-12 DIAGNOSIS — E119 Type 2 diabetes mellitus without complications: Secondary | ICD-10-CM | POA: Insufficient documentation

## 2022-07-12 DIAGNOSIS — Z853 Personal history of malignant neoplasm of breast: Secondary | ICD-10-CM | POA: Insufficient documentation

## 2022-07-12 LAB — CBC WITH DIFFERENTIAL/PLATELET
Abs Immature Granulocytes: 0.02 10*3/uL (ref 0.00–0.07)
Basophils Absolute: 0 10*3/uL (ref 0.0–0.1)
Basophils Relative: 0 %
Eosinophils Absolute: 0.1 10*3/uL (ref 0.0–0.5)
Eosinophils Relative: 3 %
HCT: 38.9 % (ref 36.0–46.0)
Hemoglobin: 12.6 g/dL (ref 12.0–15.0)
Immature Granulocytes: 0 %
Lymphocytes Relative: 37 %
Lymphs Abs: 2 10*3/uL (ref 0.7–4.0)
MCH: 27.9 pg (ref 26.0–34.0)
MCHC: 32.4 g/dL (ref 30.0–36.0)
MCV: 86.1 fL (ref 80.0–100.0)
Monocytes Absolute: 0.4 10*3/uL (ref 0.1–1.0)
Monocytes Relative: 8 %
Neutro Abs: 2.8 10*3/uL (ref 1.7–7.7)
Neutrophils Relative %: 52 %
Platelets: 154 10*3/uL (ref 150–400)
RBC: 4.52 MIL/uL (ref 3.87–5.11)
RDW: 13 % (ref 11.5–15.5)
WBC: 5.4 10*3/uL (ref 4.0–10.5)
nRBC: 0 % (ref 0.0–0.2)

## 2022-07-12 LAB — BASIC METABOLIC PANEL
Anion gap: 8 (ref 5–15)
BUN: 25 mg/dL — ABNORMAL HIGH (ref 8–23)
CO2: 28 mmol/L (ref 22–32)
Calcium: 9.2 mg/dL (ref 8.9–10.3)
Chloride: 105 mmol/L (ref 98–111)
Creatinine, Ser: 1.09 mg/dL — ABNORMAL HIGH (ref 0.44–1.00)
GFR, Estimated: 49 mL/min — ABNORMAL LOW (ref >60)
Glucose, Bld: 170 mg/dL — ABNORMAL HIGH (ref 70–99)
Potassium: 3.9 mmol/L (ref 3.5–5.1)
Sodium: 141 mmol/L (ref 135–145)

## 2022-07-12 LAB — TROPONIN I (HIGH SENSITIVITY): Troponin I (High Sensitivity): 9 ng/L (ref <18)

## 2022-07-12 NOTE — ED Provider Notes (Signed)
[facility] Provider Note    Event Date/Time   First MD Initiated Contact with Patient 07/12/22 1256     (approximate)   History   painful rib   HPI  Kelsey Haynes is a 88 y.o. female with history of of type 2 diabetes, hypertension, bradycardia, chronic renal insufficiency and as listed below presents to the emergency department for treatment and evaluation of left side chest wall pain that started this morning. Pain started after rolling over in bed. No relief with tylenol. Pain worsens with movement. No known injury.   Past Medical History:  Diagnosis Date   Breast cancer ([place]) right breast   Cancer ([place]) 2006   breast cancer 2006   Depression    Diabetes mellitus without complication ([place])    Hypertension    Personal history of radiation therapy    PONV (postoperative nausea and vomiting)      Physical Exam   Triage Vital Signs: ED Triage Vitals  Enc Vitals Group     BP 07/12/22 1125 (!) 157/77     Pulse Rate 07/12/22 1125 83     Resp 07/12/22 1125 17     Temp 07/12/22 1125 98.2 F (36.8 C)     Temp Source 07/12/22 1125 Oral     SpO2 07/12/22 1125 93 %     Weight 07/12/22 1126 150 lb (68 kg)     Height 07/12/22 1126 '4\' 10"'$  (1.473 m)     Head Circumference --      Peak Flow --      Pain Score 07/12/22 1126 7     Pain Loc --      Pain Edu? --      Excl. in [place]? --     Most recent vital signs: Vitals:   07/12/22 1530 07/12/22 1630  BP: (!) 153/72 (!) 161/86  Pulse: 79 81  Resp: 14 17  Temp:    SpO2: 93% 96%    General: Awake, no distress.  CV:  Good peripheral perfusion.  Resp:  Normal effort.  Abd:  No distention.  Other:  Pain in left chest wall not reproducable with palpation or movement.   ED Results / Procedures / Treatments   Labs (all labs ordered are listed, but only abnormal results are displayed) Labs Reviewed  BASIC METABOLIC PANEL - Abnormal; Notable for the following components:      Result Value    Glucose, Bld 170 (*)    BUN 25 (*)    Creatinine, Ser 1.09 (*)    GFR, Estimated 49 (*)    All other components within normal limits  CBC WITH DIFFERENTIAL/PLATELET  TROPONIN I (HIGH SENSITIVITY)  TROPONIN I (HIGH SENSITIVITY)     EKG  Sinus rhythm, rate of 79, unchanged from previous.   RADIOLOGY  Chest and left rib image negative for acute concerns.  I have independently reviewed and interpreted imaging as well as reviewed report from radiology.  PROCEDURES:  Critical Care performed: No  Procedures   MEDICATIONS ORDERED IN ED:  Medications - No data to display   IMPRESSION / MDM / [place] / ED COURSE   I reviewed the triage vital signs and the nursing notes.  Differential diagnosis includes, but is not limited to: Musculoskeletal strain, rib contusion, rib fracture, cardiac event  Patient's presentation is most consistent with acute presentation with potential threat to life or bodily function.  The patient is on the cardiac monitor to evaluate for evidence of  arrhythmia and/or significant heart rate changes.  88 year old female presenting to the emergency department for treatment and evaluation of the left chest wall pain that started this morning upon awakening.  No known injury.  On exam, the pain is not reproducible with palpation.  Patient not able to reproduce the pain with movement either.  Chest x-ray with included left ribs is negative for acute findings.  Because the patient does have a cardiac history and chest wall pain is not positively musculoskeletal related, plan will be to get a EKG and labs including a troponin.  Labs show BUN of 25 with a creatinine of 1.09.  This is consistent with patient's chronic renal insufficiency.  Troponin is normal.  Patient and husband requesting discharge prior to troponin repeat.  This seems reasonable since onset of pain and was greater than 5 hours ago and she is not currently having any chest pain.   Plan will be to have her follow-up with primary care or cardiology especially if she begins to have pain again and it does not respond to rest and Tylenol.  ER return precautions were also discussed.  Patient discharged in stable condition with reassuring vital signs.      FINAL CLINICAL IMPRESSION(S) / ED DIAGNOSES   Final diagnoses:  Left-sided chest wall pain     Rx / DC Orders   ED Discharge Orders     None        Note:  This document was prepared using Dragon voice recognition software and may include unintentional dictation errors.   Victorino Dike, FNP 07/12/22 [place] Ny    Duffy Bruce, MD 07/20/22 [phone]

## 2022-07-12 NOTE — ED Triage Notes (Signed)
Pt states she rolled over in bed last night and felt a rib pop and has been having pain since. Denies SOB , pt is in NAD at present

## 2022-07-12 NOTE — Discharge Instructions (Signed)
Your x-ray is normal and your EKG and bloodwork do not indicate any heart issues today.  Please follow up with your primary care provider if your pain does not improve with rest and Tylenol.  Return to the ER for symptoms that change or worsen.

## 2022-12-03 ENCOUNTER — Encounter: Payer: Self-pay | Admitting: *Deleted

## 2022-12-03 ENCOUNTER — Ambulatory Visit
Admission: EM | Admit: 2022-12-03 | Discharge: 2022-12-04 | Disposition: A | Discharge status: Home / Self Care | Payer: Medicare Other | Attending: Emergency Medicine | Admitting: Emergency Medicine

## 2022-12-03 ENCOUNTER — Emergency Department: Payer: Medicare Other

## 2022-12-03 ENCOUNTER — Other Ambulatory Visit: Payer: Self-pay

## 2022-12-03 DIAGNOSIS — K3189 Other diseases of stomach and duodenum: Secondary | ICD-10-CM | POA: Diagnosis not present

## 2022-12-03 DIAGNOSIS — Z853 Personal history of malignant neoplasm of breast: Secondary | ICD-10-CM | POA: Diagnosis not present

## 2022-12-03 DIAGNOSIS — W44F3XA Food entering into or through a natural orifice, initial encounter: Secondary | ICD-10-CM | POA: Insufficient documentation

## 2022-12-03 DIAGNOSIS — Z87891 Personal history of nicotine dependence: Secondary | ICD-10-CM | POA: Diagnosis not present

## 2022-12-03 DIAGNOSIS — K259 Gastric ulcer, unspecified as acute or chronic, without hemorrhage or perforation: Secondary | ICD-10-CM | POA: Insufficient documentation

## 2022-12-03 DIAGNOSIS — N1832 Chronic kidney disease, stage 3b: Secondary | ICD-10-CM | POA: Diagnosis not present

## 2022-12-03 DIAGNOSIS — Z923 Personal history of irradiation: Secondary | ICD-10-CM | POA: Diagnosis not present

## 2022-12-03 DIAGNOSIS — K219 Gastro-esophageal reflux disease without esophagitis: Secondary | ICD-10-CM | POA: Diagnosis not present

## 2022-12-03 DIAGNOSIS — E1122 Type 2 diabetes mellitus with diabetic chronic kidney disease: Secondary | ICD-10-CM | POA: Insufficient documentation

## 2022-12-03 DIAGNOSIS — F0390 Unspecified dementia without behavioral disturbance: Secondary | ICD-10-CM | POA: Diagnosis not present

## 2022-12-03 DIAGNOSIS — Z1152 Encounter for screening for COVID-19: Secondary | ICD-10-CM | POA: Insufficient documentation

## 2022-12-03 DIAGNOSIS — I129 Hypertensive chronic kidney disease with stage 1 through stage 4 chronic kidney disease, or unspecified chronic kidney disease: Secondary | ICD-10-CM | POA: Diagnosis not present

## 2022-12-03 DIAGNOSIS — K2970 Gastritis, unspecified, without bleeding: Secondary | ICD-10-CM | POA: Diagnosis not present

## 2022-12-03 DIAGNOSIS — Z6831 Body mass index (BMI) 31.0-31.9, adult: Secondary | ICD-10-CM | POA: Insufficient documentation

## 2022-12-03 DIAGNOSIS — E669 Obesity, unspecified: Secondary | ICD-10-CM | POA: Insufficient documentation

## 2022-12-03 DIAGNOSIS — Z9181 History of falling: Secondary | ICD-10-CM | POA: Diagnosis not present

## 2022-12-03 DIAGNOSIS — R0602 Shortness of breath: Secondary | ICD-10-CM | POA: Diagnosis present

## 2022-12-03 DIAGNOSIS — T18128A Food in esophagus causing other injury, initial encounter: Secondary | ICD-10-CM | POA: Diagnosis not present

## 2022-12-03 DIAGNOSIS — I499 Cardiac arrhythmia, unspecified: Secondary | ICD-10-CM | POA: Insufficient documentation

## 2022-12-03 LAB — CBC WITH DIFFERENTIAL/PLATELET
Abs Immature Granulocytes: 0.04 10*3/uL (ref 0.00–0.07)
Basophils Absolute: 0 10*3/uL (ref 0.0–0.1)
Basophils Relative: 0 %
Eosinophils Absolute: 0.1 10*3/uL (ref 0.0–0.5)
Eosinophils Relative: 0 %
HCT: 40.8 % (ref 36.0–46.0)
Hemoglobin: 13.2 g/dL (ref 12.0–15.0)
Immature Granulocytes: 0 %
Lymphocytes Relative: 14 %
Lymphs Abs: 1.6 10*3/uL (ref 0.7–4.0)
MCH: 28.4 pg (ref 26.0–34.0)
MCHC: 32.4 g/dL (ref 30.0–36.0)
MCV: 87.7 fL (ref 80.0–100.0)
Monocytes Absolute: 0.5 10*3/uL (ref 0.1–1.0)
Monocytes Relative: 5 %
Neutro Abs: 8.9 10*3/uL — ABNORMAL HIGH (ref 1.7–7.7)
Neutrophils Relative %: 81 %
Platelets: 165 10*3/uL (ref 150–400)
RBC: 4.65 MIL/uL (ref 3.87–5.11)
RDW: 12.8 % (ref 11.5–15.5)
WBC: 11.1 10*3/uL — ABNORMAL HIGH (ref 4.0–10.5)
nRBC: 0 % (ref 0.0–0.2)

## 2022-12-03 LAB — TROPONIN I (HIGH SENSITIVITY): Troponin I (High Sensitivity): 14 ng/L (ref <18)

## 2022-12-03 LAB — COMPREHENSIVE METABOLIC PANEL
ALT: 16 U/L (ref 0–44)
AST: 18 U/L (ref 15–41)
Albumin: 4.2 g/dL (ref 3.5–5.0)
Alkaline Phosphatase: 56 U/L (ref 38–126)
Anion gap: 10 (ref 5–15)
BUN: 22 mg/dL (ref 8–23)
CO2: 27 mmol/L (ref 22–32)
Calcium: 8.9 mg/dL (ref 8.9–10.3)
Chloride: 103 mmol/L (ref 98–111)
Creatinine, Ser: 1.05 mg/dL — ABNORMAL HIGH (ref 0.44–1.00)
GFR, Estimated: 51 mL/min — ABNORMAL LOW (ref >60)
Glucose, Bld: 208 mg/dL — ABNORMAL HIGH (ref 70–99)
Potassium: 3.8 mmol/L (ref 3.5–5.1)
Sodium: 140 mmol/L (ref 135–145)
Total Bilirubin: 0.5 mg/dL (ref 0.3–1.2)
Total Protein: 7.6 g/dL (ref 6.5–8.1)

## 2022-12-03 LAB — LACTIC ACID, PLASMA: Lactic Acid, Venous: 1.5 mmol/L (ref 0.5–1.9)

## 2022-12-03 LAB — BRAIN NATRIURETIC PEPTIDE: B Natriuretic Peptide: 151.8 pg/mL — ABNORMAL HIGH (ref 0.0–100.0)

## 2022-12-03 MED ORDER — SODIUM CHLORIDE 0.9 % IV BOLUS
1000.0000 mL | Freq: Once | INTRAVENOUS | Status: AC
  Administered 2022-12-03: 1000 mL via INTRAVENOUS

## 2022-12-03 MED ORDER — SODIUM CHLORIDE 0.9 % IV SOLN
500.0000 mg | Freq: Once | INTRAVENOUS | Status: AC
  Administered 2022-12-04: 500 mg via INTRAVENOUS
  Filled 2022-12-03: qty 5

## 2022-12-03 MED ORDER — ONDANSETRON HCL 4 MG/2ML IJ SOLN
4.0000 mg | Freq: Once | INTRAMUSCULAR | Status: AC
  Administered 2022-12-04: 4 mg via INTRAVENOUS
  Filled 2022-12-03: qty 2

## 2022-12-03 MED ORDER — SODIUM CHLORIDE 0.9 % IV SOLN
1.0000 g | Freq: Once | INTRAVENOUS | Status: AC
  Administered 2022-12-04: 1 g via INTRAVENOUS
  Filled 2022-12-03: qty 10

## 2022-12-03 MED ORDER — SODIUM CHLORIDE 0.9 % IV SOLN: Freq: Once | INTRAVENOUS | Status: AC

## 2022-12-03 NOTE — ED Triage Notes (Signed)
Pt brought in via ems from [place].  Pt having diff breathing and spitting up phelgm with a cough.   No chest pain.  Pt on 2 liters oxygen Kelsey Haynes.  Pt alert

## 2022-12-03 NOTE — ED Provider Notes (Addendum)
[facility] Provider Note    Event Date/Time   First MD Initiated Contact with Patient 12/03/22 2220     (approximate)   History   Shortness of Breath   HPI  Kelsey Haynes is a 88 y.o. female   Past medical history of [place] breast cancer, diabetes, depression, hypertension who presents to the emergency department with tagging on her [place] around 1 PM at lunchtime and has had a choking sensation since then.  Does not quite describe a globus sensation and has been maintaining secretions but describes both symptoms consistent with esophageal impaction and that she continues to have a gagging sensation has not been able to eat, but also describes some aspiration symptoms and that she is short of breath at times.  Prior to this episode she was in her regular state of health with no recent illnesses no medical complaints.  She is maintaining her secretions and has no respiratory distress and is speaking in full sentences when I assess her initially.  History was obtained via patient.      Physical Exam   Triage Vital Signs: ED Triage Vitals  Enc Vitals Group     BP --      Pulse Rate 12/03/22 2212 (!) 114     Resp 12/03/22 2212 (!) 26     Temp 12/03/22 2212 98.4 F (36.9 C)     Temp Source 12/03/22 2212 Oral     SpO2 12/03/22 2212 97 %     Weight 12/03/22 2207 149 lb 14.6 oz (68 kg)     Height 12/03/22 2207 '4\' 10"'$  (1.473 m)     Head Circumference --      Peak Flow --      Pain Score --      Pain Loc --      Pain Edu? --      Excl. in [place]? --     Most recent vital signs: Vitals:   12/03/22 2212  Pulse: (!) 114  Resp: (!) 26  Temp: 98.4 F (36.9 C)  SpO2: 97%    General: Awake, no distress.  CV:  Good peripheral perfusion.  Resp:  Resp  rate is 26 but she is in no respiratory distress she is maintaining her secretions and speaking in full sentences to me.  She occasionally has a gagging sensation which quickly resolves.  She has no  vomiting. Abd:  No distention.    ED Results / Procedures / Treatments   Labs (all labs ordered are listed, but only abnormal results are displayed) Labs Reviewed  COMPREHENSIVE METABOLIC PANEL - Abnormal; Notable for the following components:      Result Value   Glucose, Bld 208 (*)    Creatinine, Ser 1.05 (*)    GFR, Estimated 51 (*)    All other components within normal limits  CBC WITH DIFFERENTIAL/PLATELET - Abnormal; Notable for the following components:   WBC 11.1 (*)    Neutro Abs 8.9 (*)    All other components within normal limits  RESP PANEL BY RT-PCR (FLU A&B, COVID) ARPGX2  LACTIC ACID, PLASMA  BRAIN NATRIURETIC PEPTIDE  BLOOD GAS, VENOUS  LACTIC ACID, PLASMA  LACTIC ACID, PLASMA  TROPONIN I (HIGH SENSITIVITY)     I reviewed labs and they are notable for blood cell count of 11.1.  EKG  ED ECG REPORT I, Lucillie Garfinkel, the attending physician, personally viewed and interpreted this ECG.   Date: 12/03/2022  EKG Time: 2229  Rate:  107  Rhythm: normal sinus rhythm with some sinus pauses  Axis: nl  Intervals:non  ST&T Change: no acute ischemic changes    RADIOLOGY I independently reviewed and interpreted chest x-ray and see no obvious foreign bodies   PROCEDURES:  Critical Care performed: No  Procedures   MEDICATIONS ORDERED IN ED: Medications  sodium chloride 0.9 % bolus 1,000 mL (1,000 mLs Intravenous New Bag/Given 12/03/22 2315)    Consultants:  IMPRESSION / MDM / [place] / ED COURSE  I reviewed the triage vital signs and the nursing notes.                              Differential diagnosis includes, but is not limited to, esophageal impaction, aspiration, infection   MDM: Patient was esophageal impaction versus aspiration that has been stable for approximately 10 hours since the inciting event and is maintaining secretions and airway at this time.  Further diagnostics ordered including CT scan and chest x-ray to further try to  delineate diagnosis prior to further management and consultation.   CT scan shows what appears to be a food impaction in the hiatal hernia sac stomach portion above the diaphragmatic hiatus; I spoke with Dr. Virgina Jock of gastroenterology who plan to do an EGD in the morning.  Given that the patient is maintaining secretions and no respiratory distress and has had the sensation for the past 10 hours, procedure does not appear to be emergent at this time we will continue to monitor.   Patient's presentation is most consistent with acute presentation with potential threat to life or bodily function.       FINAL CLINICAL IMPRESSION(S) / ED DIAGNOSES   Final diagnoses:  Esophageal obstruction due to food impaction     Rx / DC Orders   ED Discharge Orders     None        Note:  This document was prepared using Dragon voice recognition software and may include unintentional dictation errors.    Lucillie Garfinkel, MD 12/03/22 3845    Lucillie Garfinkel, MD 12/03/22 [phone]

## 2022-12-03 NOTE — ED Notes (Signed)
Pt return from xray and pt to CT scan.

## 2022-12-03 NOTE — ED Provider Notes (Signed)
-----------------------------------------   11:52 PM on 12/03/2022 -----------------------------------------  Assuming care from Dr. Jacelyn Grip.  In short, Kelsey Haynes is a 88 y.o. female with a chief complaint of esophageal food bolus impaction.  Refer to the original H&P for additional details.  The current plan of care is to keep the patient in the emergency department overnight.  Dr. Jacelyn Grip spoke with Dr. Virgina Jock with gastroenterology and he is planning to take the patient to endoscopy in the morning.  Given showing possible aspiration or infiltrate, Dr. Jacelyn Grip ordered CAP treatment (ceftriaxone 1 g IV and azithromycin 500 mg IV).  The patient is in no distress, will remain NPO, and has NS 100 mL/hr gtt.   Clinical Course as of 12/04/22 2202  Fri Dec 04, 2022  0122 The patient has been unable to take her amlodipine and her blood pressure continues to increase, now at 199/106.  Given that she cannot take oral medications, I ordered labetalol 20 mg IV. [CF]  0604 BP well-controlled hours at labetalol, currently 137/89 [CF]  0605 Awaiting endoscopy. [CF]    Clinical Course User Index [CF] Hinda Kehr, MD     Medications  pantoprazole (PROTONIX) injection 40 mg (has no administration in time range)  sodium chloride 0.9 % bolus 1,000 mL (0 mLs Intravenous Stopped 12/04/22 0051)  0.9 %  sodium chloride infusion (0 mLs Intravenous Stopped 12/04/22 0517)  ondansetron (ZOFRAN) injection 4 mg (4 mg Intravenous Given 12/04/22 0017)  azithromycin (ZITHROMAX) 500 mg in sodium chloride 0.9 % 250 mL IVPB (0 mg Intravenous Stopped 12/04/22 0158)  cefTRIAXone (ROCEPHIN) 1 g in sodium chloride 0.9 % 100 mL IVPB (0 g Intravenous Stopped 12/04/22 0050)  labetalol (NORMODYNE) injection 20 mg (20 mg Intravenous Given 12/04/22 0140)     ED Discharge Orders     None      Final diagnoses:  Esophageal obstruction due to food impaction     Hinda Kehr, MD 12/04/22 [phone]

## 2022-12-04 ENCOUNTER — Emergency Department: Payer: Medicare Other | Admitting: Anesthesiology

## 2022-12-04 ENCOUNTER — Encounter: Payer: Self-pay | Admitting: Gastroenterology

## 2022-12-04 ENCOUNTER — Encounter
Admission: EM | Disposition: A | Discharge status: Home / Self Care | Payer: Self-pay | Source: Home / Self Care | Attending: Emergency Medicine

## 2022-12-04 HISTORY — PX: ESOPHAGOGASTRODUODENOSCOPY (EGD) WITH PROPOFOL: SHX5813

## 2022-12-04 LAB — RESP PANEL BY RT-PCR (FLU A&B, COVID) ARPGX2
Influenza A by PCR: NEGATIVE
Influenza B by PCR: NEGATIVE
SARS Coronavirus 2 by RT PCR: NEGATIVE

## 2022-12-04 SURGERY — ESOPHAGOGASTRODUODENOSCOPY (EGD) WITH PROPOFOL
Anesthesia: General

## 2022-12-04 MED ORDER — PROPOFOL 1000 MG/100ML IV EMUL
INTRAVENOUS | Status: AC
  Filled 2022-12-04: qty 100

## 2022-12-04 MED ORDER — FENTANYL CITRATE (PF) 100 MCG/2ML IJ SOLN
INTRAMUSCULAR | Status: AC
  Filled 2022-12-04: qty 2

## 2022-12-04 MED ORDER — ROCURONIUM BROMIDE 100 MG/10ML IV SOLN
INTRAVENOUS | Status: DC | PRN
  Administered 2022-12-04: 10 mg via INTRAVENOUS
  Administered 2022-12-04: 20 mg via INTRAVENOUS

## 2022-12-04 MED ORDER — FENTANYL CITRATE (PF) 100 MCG/2ML IJ SOLN
INTRAMUSCULAR | Status: DC | PRN
  Administered 2022-12-04 (×2): 25 ug via INTRAVENOUS

## 2022-12-04 MED ORDER — LABETALOL HCL 5 MG/ML IV SOLN
20.0000 mg | Freq: Once | INTRAVENOUS | Status: AC
  Administered 2022-12-04: 20 mg via INTRAVENOUS
  Filled 2022-12-04: qty 4

## 2022-12-04 MED ORDER — SUGAMMADEX SODIUM 200 MG/2ML IV SOLN
INTRAVENOUS | Status: DC | PRN
  Administered 2022-12-04: 200 mg via INTRAVENOUS

## 2022-12-04 MED ORDER — PANTOPRAZOLE SODIUM 40 MG IV SOLR: 40.0000 mg | Freq: Two times a day (BID) | INTRAVENOUS | Status: DC

## 2022-12-04 MED ORDER — EPHEDRINE 5 MG/ML INJ
INTRAVENOUS | Status: AC
  Filled 2022-12-04: qty 5

## 2022-12-04 MED ORDER — LIDOCAINE HCL (CARDIAC) PF 100 MG/5ML IV SOSY
PREFILLED_SYRINGE | INTRAVENOUS | Status: DC | PRN
  Administered 2022-12-04: 80 mg via INTRAVENOUS
  Administered 2022-12-04: 20 mg via INTRAVENOUS

## 2022-12-04 MED ORDER — GLYCOPYRROLATE 0.2 MG/ML IJ SOLN
INTRAMUSCULAR | Status: DC | PRN
  Administered 2022-12-04: .2 mg via INTRAVENOUS

## 2022-12-04 MED ORDER — ROCURONIUM BROMIDE 10 MG/ML (PF) SYRINGE
PREFILLED_SYRINGE | INTRAVENOUS | Status: AC
  Filled 2022-12-04: qty 10

## 2022-12-04 MED ORDER — PROPOFOL 10 MG/ML IV BOLUS
INTRAVENOUS | Status: DC | PRN
  Administered 2022-12-04: 90 mg via INTRAVENOUS

## 2022-12-04 MED ORDER — SODIUM CHLORIDE 0.9 % IV SOLN: INTRAVENOUS | Status: DC

## 2022-12-04 MED ORDER — SUCCINYLCHOLINE CHLORIDE 200 MG/10ML IV SOSY
PREFILLED_SYRINGE | INTRAVENOUS | Status: DC | PRN
  Administered 2022-12-04: 100 mg via INTRAVENOUS

## 2022-12-04 MED ORDER — DEXAMETHASONE SODIUM PHOSPHATE 10 MG/ML IJ SOLN
INTRAMUSCULAR | Status: AC
  Filled 2022-12-04: qty 1

## 2022-12-04 MED ORDER — EPHEDRINE SULFATE (PRESSORS) 50 MG/ML IJ SOLN
INTRAMUSCULAR | Status: DC | PRN
  Administered 2022-12-04: 5 mg via INTRAVENOUS
  Administered 2022-12-04: 10 mg via INTRAVENOUS

## 2022-12-04 MED ORDER — ONDANSETRON HCL 4 MG/2ML IJ SOLN
INTRAMUSCULAR | Status: DC | PRN
  Administered 2022-12-04: 4 mg via INTRAVENOUS

## 2022-12-04 MED ORDER — DEXAMETHASONE SODIUM PHOSPHATE 10 MG/ML IJ SOLN
INTRAMUSCULAR | Status: DC | PRN
  Administered 2022-12-04: 4 mg via INTRAVENOUS

## 2022-12-04 MED ORDER — ONDANSETRON HCL 4 MG/2ML IJ SOLN
INTRAMUSCULAR | Status: AC
  Filled 2022-12-04: qty 2

## 2022-12-04 MED ORDER — SUCCINYLCHOLINE CHLORIDE 200 MG/10ML IV SOSY
PREFILLED_SYRINGE | INTRAVENOUS | Status: AC
  Filled 2022-12-04: qty 10

## 2022-12-04 MED ORDER — PROPOFOL 500 MG/50ML IV EMUL
INTRAVENOUS | Status: DC | PRN
  Administered 2022-12-04: 154.412 ug/kg/min via INTRAVENOUS

## 2022-12-04 NOTE — Anesthesia Preprocedure Evaluation (Addendum)
Anesthesia Evaluation  Patient identified by MRN, date of birth, ID band Patient awake    Reviewed: Allergy & Precautions, NPO status , Patient's Chart, lab work & pertinent test results  History of Anesthesia Complications (+) PONV and history of anesthetic complications  Airway Mallampati: III   Neck ROM: Full    Dental  (+) Chipped   Pulmonary former smoker (quit 1958)   Pulmonary exam normal breath sounds clear to auscultation       Cardiovascular hypertension, Normal cardiovascular exam Rhythm:Regular Rate:Normal     Neuro/Psych  PSYCHIATRIC DISORDERS  Depression   Dementia negative neurological ROS     GI/Hepatic ,GERD  ,,  Endo/Other  diabetes, Type 2  Obesity   Renal/GU Renal disease (stage III CKD)     Musculoskeletal   Abdominal   Peds  Hematology Breast CA   Anesthesia Other Findings Cardiology note 06/23/22:  88 y.o. female with  1. History of cardiac arrhythmia  2. Non-insulin dependent type 2 diabetes mellitus (CMS-HCC)  3. Primary hypertension  4. Stage 3b chronic kidney disease (CMS-HCC)  5. Obesity, Class I, BMI 30-34.9, unspecified  6. Diabetes mellitus type 2, diet-controlled (CMS-HCC)  7. CRI (chronic renal insufficiency), stage 3 (moderate) (CMS-HCC)  8. Tachycardia  9. Frequent falls  10. Bradycardia  11. Falls frequently  12. Essential hypertension   Plan  1 asthma with recommend inhalers as necessary Continuing current 2 obesity recommend modest weight loss exercise portion control 3) diabetes type 2 uncomplicated continue current therapy 4 GERD by history continue omeprazole therapy for reflux type symptoms 5 chronic renal sufficiency stage III current therapy follow-up with nephrology 6 orthostatic blood pressure extremities for evaluation blood pressure changes 7 have the patient follow-up in 6 months  Return in about 6 months (around 12/23/2022).     Reproductive/Obstetrics                             Anesthesia Physical Anesthesia Plan  ASA: 2 and emergent  Anesthesia Plan: General   Post-op Pain Management:    Induction: Intravenous and Rapid sequence  PONV Risk Score and Plan: 4 or greater and Ondansetron and Treatment may vary due to age or medical condition  Airway Management Planned: Oral ETT  Additional Equipment:   Intra-op Plan:   Post-operative Plan: Extubation in OR  Informed Consent: I have reviewed the patients History and Physical, chart, labs and discussed the procedure including the risks, benefits and alternatives for the proposed anesthesia with the patient or authorized representative who has indicated his/her understanding and acceptance.     Dental advisory given  Plan Discussed with: CRNA  Anesthesia Plan Comments: (Patient consented for risks of anesthesia including but not limited to:  - adverse reactions to medications - damage to eyes, teeth, lips or other oral mucosa - nerve damage due to positioning  - sore throat or hoarseness - damage to heart, brain, nerves, lungs, other parts of body or loss of life  Informed patient about role of CRNA in peri- and intra-operative care.  Patient voiced understanding.)       Anesthesia Quick Evaluation

## 2022-12-04 NOTE — Transfer of Care (Signed)
Immediate Anesthesia Transfer of Care Note  Patient: Kelsey Haynes  Procedure(s) Performed: ESOPHAGOGASTRODUODENOSCOPY (EGD) WITH PROPOFOL  Patient Location: Endoscopy Unit  Anesthesia Type:General  Level of Consciousness: drowsy  Airway & Oxygen Therapy: Patient Spontanous Breathing and Patient connected to face mask oxygen  Post-op Assessment: Report given to RN and Post -op Vital signs reviewed and stable  Post vital signs: Reviewed and stable  Last Vitals:  Vitals Value Taken Time  BP 118/94 12/04/22 0922  Temp    Pulse 96 12/04/22 0926  Resp 17 12/04/22 0926  SpO2 100 % 12/04/22 0926  Vitals shown include unvalidated device data.  Last Pain:  Vitals:   12/04/22 0743  TempSrc:   PainSc: 0-No pain         Complications: No notable events documented.

## 2022-12-04 NOTE — ED Notes (Signed)
Pt transported to Endoscopy

## 2022-12-04 NOTE — Interval H&P Note (Signed)
History and Physical Interval Note: Preprocedure H&P from 12/04/22  was reviewed and there was no interval change after seeing and examining the patient.  Written consent was obtained from the patient after discussion of risks, benefits, and alternatives. Patient has consented to proceed with Esophagogastroduodenoscopy with possible intervention   12/04/2022 8:08 AM  Kelsey Haynes  has presented today for surgery, with the diagnosis of food impaction.  The various methods of treatment have been discussed with the patient and family. After consideration of risks, benefits and other options for treatment, the patient has consented to  Procedure(s) with comments: ESOPHAGOGASTRODUODENOSCOPY (EGD) WITH PROPOFOL (N/A) - please make 1st case of morning as a surgical intervention.  The patient's history has been reviewed, patient examined, no change in status, stable for surgery.  I have reviewed the patient's chart and labs.  Questions were answered to the patient's satisfaction.     [name]

## 2022-12-04 NOTE — Op Note (Signed)
[facility] Patient Name: Kelsey Haynes Procedure Date: 12/04/2022 8:08 AM MRN: [id number] Account #: [id number] Date of Birth: 11/14/1932 Admit Type: Outpatient Age: 88 Room: [facility] ENDO ROOM 1 Gender: Female Note Status: Finalized Instrument Name: Altamese Cabal Endoscope [id number] Procedure:             Upper GI endoscopy Indications:           Foreign body in the esophagus Providers:             Rueben Bash, DO Referring MD:          Ocie Cornfield. Ouida Sills MD, MD (Referring MD) Medicines:             Monitored Anesthesia Care Complications:         No immediate complications. Estimated blood loss:                         Minimal. Procedure:             Pre-Anesthesia Assessment:                        - Prior to the procedure, a History and Physical was                         performed, and patient medications and allergies were                         reviewed. The patient is competent. The risks and                         benefits of the procedure and the sedation options and                         risks were discussed with the patient. All questions                         were answered and informed consent was obtained.                         Patient identification and proposed procedure were                         verified by the physician, the nurse, the                         anesthesiologist, the anesthetist and the technician                         in the endoscopy suite. Mental Status Examination:                         alert and oriented. Airway Examination: normal                         oropharyngeal airway and neck mobility. Respiratory                         Examination: clear to auscultation. CV Examination:  normal. Prophylactic Antibiotics: The patient does not                         require prophylactic antibiotics. Prior                         Anticoagulants: The patient has taken no  anticoagulant                         or antiplatelet agents. ASA Grade Assessment: II - A                         patient with mild systemic disease. After reviewing                         the risks and benefits, the patient was deemed in                         satisfactory condition to undergo the procedure. The                         anesthesia plan was to use general anesthesia.                         Immediately prior to administration of medications,                         the patient was re-assessed for adequacy to receive                         sedatives. The heart rate, respiratory rate, oxygen                         saturations, blood pressure, adequacy of pulmonary                         ventilation, and response to care were monitored                         throughout the procedure. The physical status of the                         patient was re-assessed after the procedure.                        After obtaining informed consent, the endoscope was                         passed under direct vision. Throughout the procedure,                         the patient's blood pressure, pulse, and oxygen                         saturations were monitored continuously. The Endoscope                         was introduced through the mouth, and advanced to the  second part of duodenum. The upper GI endoscopy was                         accomplished without difficulty. The patient tolerated                         the procedure well. Findings:      The duodenal bulb, first portion of the duodenum and second portion of       the duodenum were normal. Estimated blood loss: none.      Localized Inflammatory/granular/friable mucosal changes were found in       the cardia extending from/towards GEJ. Biopsies were taken with a cold       forceps for histology. Estimated blood loss was minimal.      A medium amount of food (residue) was found in the entire  examined       stomach. This food limited visualization at times.      The exam of the stomach was otherwise normal.      Esophagogastric landmarks were identified: the gastroesophageal junction       was found at 35 cm from the incisors.      The Z-line was regular.      Diffuse moderate inflammation characterized by congestion (edema) and       erythema was found in the entire esophagus secondary to food stasis.       Narrowed GEJ but scope easily able to traverse and advance food through.      Food was found starting at 25 cm from incisors up to the GEJ. This was       carefully advanced with the gastroscope into the gastric lumen.      Food was found in the mid esophagus and in the distal esophagus. Food       safely able to be advanced through the GEJ into the gastric lumen wiht       teh gastroscope Estimated blood loss: none. Impression:            - Normal duodenal bulb, first portion of the duodenum                         and second portion of the duodenum.                        - Mucosal changes in the cardia. Biopsied.                        - A medium amount of food (residue) in the stomach.                        - Esophagogastric landmarks identified.                        - Z-line regular.                        - Esophageal mucosal changes were present, including                         congestion (edema) and erythema. Findings are  suggestive of inflammation.                        - Food in the mid esophagus and in the distal                         esophagus. Recommendation:        - Patient has a contact number available for                         emergencies. The signs and symptoms of potential                         delayed complications were discussed with the patient.                         Return to normal activities tomorrow. Written                         discharge instructions were provided to the patient.                         - Discharge patient to home.                        - Soft food, moisten and chew food well. small bites.                         liquids. until repeat egd with dilation.                        - Continue present medications.                        - Use Prilosec (omeprazole) 40 mg PO BID for 8 weeks.                        - This is an increase in her home omeprazole.                        Recommend 4 additional days of antibiotics (rcd some                         in ED yesterday) for [facility] to provide. If needed,                         I can call in prescription- please call                         [phone]). Recommend Augmentin '875mg'$  q12 hours                         for four (4) additional days.                        - Await pathology results.                        - Repeat upper endoscopy in 2 weeks to evaluate the  response to therapy.                        - Return to GI office at appointment to be scheduled.                        - The findings and recommendations were discussed with                         the patient's family.                        - The findings and recommendations were discussed with                         the patient. Procedure Code(s):     --- Professional ---                        [phone], Esophagogastroduodenoscopy, flexible,                         transoral; with biopsy, single or multiple Diagnosis Code(s):     --- Professional ---                        K31.89, Other diseases of stomach and duodenum                        K22.89, Other specified disease of esophagus                        T18.128A, Food in esophagus causing other injury,                         initial encounter                        T18.108A, Unspecified foreign body in esophagus                         causing other injury, initial encounter CPT copyright 2022 American Medical Association. All rights reserved. The codes documented in this  report are preliminary and upon coder review may  be revised to meet current compliance requirements. Attending Participation:      I personally performed the entire procedure. Volney American, DO [name] DO, DO 12/04/2022 9:31:39 AM This report has been signed electronically. Number of Addenda: 0 Note Initiated On: 12/04/2022 8:08 AM Estimated Blood Loss:  Estimated blood loss was minimal.      [facility]

## 2022-12-04 NOTE — Consult Note (Signed)
Inpatient Consultation   Patient ID: Kelsey Haynes is a 88 y.o. female.  Requesting Provider: Lucillie Garfinkel, MD  Date of Admission: 12/03/2022  Date of Consult: 12/04/22   Reason for Consultation: food impaction   Patient's Chief Complaint:   Chief Complaint  Patient presents with   Shortness of Breath    88 year old Caucasian female with history of breast cancer status postradiation and surgery, DM 2, hypertension who presents in evaluation for possible food impaction.  She is accompanied by her daughter at bedside who helps provide history.  Patient was eating [place] or chicken yesterday when she felt like this became lodged.  She has had this happen in the past but says it is as passed its own.  She denies any odynophagia.  Most the time these issues occur with solids.  She has no dysphagia to liquids or pills.  Symptoms have been occurring over the last year.  It sounds like she has seen speech therapy for swallowing issues in the recent past with a normal MBSS.  She denies weight changes or appetite changes.  She is able to converse normally and maintaining her own airway.  Managing her own secretions.  CT demonstrated fluid within the hiatal hernia sac.   Denies NSAIDs, Anti-plt agents, and anticoagulants Denies family history of gastrointestinal disease and malignancy Previous Endoscopies: CSY at age 9 approximately EGD- does not believe she's had one   Past Medical History:  Diagnosis Date   Breast cancer [facility]) right breast   Cancer ([place]) 2006   breast cancer 2006   Depression    Diabetes mellitus without complication ([place])    Hypertension    Personal history of radiation therapy    PONV (postoperative nausea and vomiting)     Past Surgical History:  Procedure Laterality Date   ABDOMINAL HYSTERECTOMY     BREAST BIOPSY Right 2006   +   BREAST CYST ASPIRATION Right    CATARACT EXTRACTION W/PHACO Left 11/14/2019   Procedure: CATARACT EXTRACTION PHACO AND  INTRAOCULAR LENS PLACEMENT ([place]) LEFT DIABETIC;  Surgeon: Birder Robson, MD;  Location: [place];  Service: Ophthalmology;  Laterality: Left;  [place] 1:18 CDE 14.93   CATARACT EXTRACTION W/PHACO Right 12/05/2019   Procedure: CATARACT EXTRACTION PHACO AND INTRAOCULAR LENS PLACEMENT ([facility]) RIGHT DIABETIC 7.58, 00:52.3;  Surgeon: Birder Robson, MD;  Location: [place];  Service: Ophthalmology;  Laterality: Right;   endoscopic carpal tunnel release     HALLUX VALGUS CORRECTION Left 1983   HERNIA REPAIR     x2   left foot surgery Left    LEFT HEART CATH AND CORONARY ANGIOGRAPHY Left 07/06/2019   Procedure: LEFT HEART CATH AND CORONARY ANGIOGRAPHY;  Surgeon: Yolonda Kida, MD;  Location: [place] [facility];  Service: Cardiovascular;  Laterality: Left;   SHOULDER SURGERY Bilateral    SPINE SURGERY     back    Allergies  Allergen Reactions   Other Swelling and Other (See Comments)    Hazel nuts    Family History  Problem Relation Age of Onset   Breast cancer Neg Hx     Social History   Tobacco Use   Smoking status: Former    Packs/day: 0.10    Types: Cigarettes    Quit date: 12/28/1956    Years since quitting: 65.9   Smokeless tobacco: Never   Tobacco comments:    smoked in college  Vaping Use   Vaping Use: Never used  Substance Use Topics  Alcohol use: Not Currently    Comment: 1 drink per month     Pertinent GI related history and allergies were reviewed with the patient  Review of Systems  Constitutional:  Negative for activity change, appetite change, chills, diaphoresis, fatigue, fever and unexpected weight change.  HENT:  Positive for trouble swallowing. Negative for voice change.   Respiratory:  Negative for shortness of breath and wheezing.   Cardiovascular:  Negative for chest pain, palpitations and leg swelling.  Gastrointestinal:  Negative for abdominal distention, abdominal pain, anal bleeding, blood in stool, constipation, diarrhea,  nausea, rectal pain and vomiting.  Musculoskeletal:  Negative for arthralgias and myalgias.  Skin:  Negative for color change and pallor.  Neurological:  Negative for dizziness, syncope and weakness.  Psychiatric/Behavioral:  Negative for confusion.   All other systems reviewed and are negative.    Medications Home Medications No current facility-administered medications on file prior to encounter.   Current Outpatient Medications on File Prior to Encounter  Medication Sig Dispense Refill   acetaminophen (TYLENOL) 325 MG tablet Take 650 mg by mouth every 6 (six) hours as needed for moderate pain or headache.      amLODipine (NORVASC) 10 MG tablet Take 1 tablet (10 mg total) by mouth daily.     gabapentin (NEURONTIN) 100 MG capsule Take 200 mg in the morning, 100 mg in the afternoon, and 100 mg at night for one week, then increase to 200 mg in the morning, 200 mg in the afternoon, and 100 mg at night for one week, then increase to 200 mg three times per day.     ibuprofen (ADVIL) 200 MG tablet Take 200 mg by mouth every 6 (six) hours as needed.     melatonin 3 MG TABS tablet Take 1 tablet by mouth at bedtime.     Multiple Vitamin (MULTI-VITAMINS) TABS Take 1 tablet by mouth daily.      omeprazole (PRILOSEC) 20 MG capsule Take 20 mg by mouth every morning.     Pertinent GI related medications were reviewed with the patient  Inpatient Medications  Current Facility-Administered Medications:    pantoprazole (PROTONIX) injection 40 mg, 40 mg, Intravenous, Q12H, [name], DO  Current Outpatient Medications:    acetaminophen (TYLENOL) 325 MG tablet, Take 650 mg by mouth every 6 (six) hours as needed for moderate pain or headache. , Disp: , Rfl:    amLODipine (NORVASC) 10 MG tablet, Take 1 tablet (10 mg total) by mouth daily., Disp: , Rfl:    gabapentin (NEURONTIN) 100 MG capsule, Take 200 mg in the morning, 100 mg in the afternoon, and 100 mg at night for one week, then increase  to 200 mg in the morning, 200 mg in the afternoon, and 100 mg at night for one week, then increase to 200 mg three times per day., Disp: , Rfl:    ibuprofen (ADVIL) 200 MG tablet, Take 200 mg by mouth every 6 (six) hours as needed., Disp: , Rfl:    melatonin 3 MG TABS tablet, Take 1 tablet by mouth at bedtime., Disp: , Rfl:    Multiple Vitamin (MULTI-VITAMINS) TABS, Take 1 tablet by mouth daily. , Disp: , Rfl:    omeprazole (PRILOSEC) 20 MG capsule, Take 20 mg by mouth every morning., Disp: , Rfl:       Objective   Vitals:   12/04/22 0230 12/04/22 0300 12/04/22 0315 12/04/22 0515  BP: (!) 169/90 (!) 174/84 (!) 162/98 137/89  Pulse: 97 (!) 103 Marland Kitchen)  101 (!) 105  Resp: (!) 22 (!) '21 20 20  '$ Temp:    (!) 97.4 F (36.3 C)  TempSrc:      SpO2: 97% 96% 94% 94%  Weight:      Height:         Physical Exam Vitals and nursing note reviewed.  Constitutional:      General: She is not in acute distress.    Appearance: Normal appearance. She is not ill-appearing, toxic-appearing or diaphoretic.  HENT:     Head: Normocephalic and atraumatic.     Nose: Nose normal.     Mouth/Throat:     Mouth: Mucous membranes are moist.     Pharynx: Oropharynx is clear.  Eyes:     General: No scleral icterus.    Extraocular Movements: Extraocular movements intact.  Cardiovascular:     Rate and Rhythm: Normal rate. Rhythm irregular.     Heart sounds: Normal heart sounds. No murmur heard.    No friction rub. No gallop.  Pulmonary:     Effort: Pulmonary effort is normal. No respiratory distress.     Breath sounds: Normal breath sounds. No wheezing, rhonchi or rales.     Comments: Wearing 3LNC Abdominal:     General: Abdomen is flat. Bowel sounds are normal. There is no distension.     Palpations: Abdomen is soft.     Tenderness: There is no abdominal tenderness. There is no guarding or rebound.  Musculoskeletal:     Cervical back: Neck supple.     Right lower leg: No edema.     Left lower leg: No  edema.  Skin:    General: Skin is warm and dry.     Coloration: Skin is not jaundiced or pale.  Neurological:     General: No focal deficit present.     Mental Status: She is alert and oriented to person, place, and time. Mental status is at baseline.  Psychiatric:        Mood and Affect: Mood normal.        Behavior: Behavior normal.        Thought Content: Thought content normal.        Judgment: Judgment normal.     Laboratory Data Recent Labs  Lab 12/03/22 2237  WBC 11.1*  HGB 13.2  HCT 40.8  PLT 165   Recent Labs  Lab 12/03/22 2237  NA 140  K 3.8  CL 103  CO2 27  BUN 22  CALCIUM 8.9  PROT 7.6  BILITOT 0.5  ALKPHOS 56  ALT 16  AST 18  GLUCOSE 208*   No results for input(s): "INR" in the last 168 hours.  No results for input(s): "LIPASE" in the last 72 hours.      Imaging Studies: CT Chest Wo Contrast  Result Date: 12/03/2022 CLINICAL DATA:  Foreign body aspiration, dyspnea, productive cough EXAM: CT CHEST WITHOUT CONTRAST TECHNIQUE: Multidetector CT imaging of the chest was performed following the standard protocol without IV contrast. RADIATION DOSE REDUCTION: This exam was performed according to the departmental dose-optimization program which includes automated exposure control, adjustment of the mA and/or kV according to patient size and/or use of iterative reconstruction technique. COMPARISON:  Upper gastrointestinal series 01/26/2022 FINDINGS: Cardiovascular: No significant coronary artery calcification. Mild calcification of the mitral valve annulus. Global cardiac size within normal limits. No pericardial effusion. Central pulmonary arteries are of normal caliber. Mild atherosclerotic calcification within the thoracic aorta. No aortic aneurysm. Mediastinum/Nodes: A small sliding hiatal hernia  is present. A large amount of debris is seen within the lumen of the herniated stomach above the level of the diaphragmatic hiatus which may represent ingested food  bolus. A superimposed underlying obstructing lesion such as a stricture may be present at the diaphragmatic hiatus. The esophagus proximally is distended and debris and fluid-filled to the level of the thoracic inlet. No pathologic thoracic adenopathy. No pneumomediastinum. No mediastinal fluid collections or inflammatory stranding identified. Lungs/Pleura: Mild centrilobular nodularity within the left lower lobe is nonspecific but can be seen the setting of acute infection or aspiration. The lungs are otherwise clear. No pneumothorax or pleural effusion. No central obstructing lesion. Upper Abdomen: No acute abnormality. Musculoskeletal: Degenerative changes are seen within the thoracic spine, right sternoclavicular articulation, and left shoulder appear. Remote, healed rib fractures are noted bilaterally. Remote compression deformity of T11 with mild loss of height. No acute bone abnormality. No lytic or blastic bone lesion. IMPRESSION: 1. Small sliding hiatal hernia. Large amount of debris within the lumen of the herniated stomach above the level of the diaphragmatic hiatus which may represent ingested food bolus. A superimposed underlying obstructing lesion such as a stricture is not excluded. The esophagus proximally is distended and debris and fluid-filled to the level of the thoracic inlet. 2. Mild centrilobular nodularity within the left lower lobe is nonspecific but can be seen the setting of acute infection or aspiration. Aortic Atherosclerosis (ICD10-I70.0). Electronically Signed   By: Fidela Salisbury M.D.   On: 12/03/2022 23:12   DG Chest 2 View  Result Date: 12/03/2022 CLINICAL DATA:  Sepsis vomiting EXAM: CHEST - 2 VIEW COMPARISON:  07/12/2022 FINDINGS: Mild cardiomegaly. Mild diffuse chronic interstitial opacity. No consolidation, pleural effusion, or pneumothorax. Aortic atherosclerosis. Old left-sided rib fractures. IMPRESSION: No active cardiopulmonary disease. Mild cardiomegaly. Electronically  Signed   By: Donavan Foil M.D.   On: 12/03/2022 23:00    Assessment:   # Food Impaction  # Irregular heart rhythm - previously evaluated by Dr. Clayborn Bigness  # Breast cancer s/p radiation/surgery # HTN # DM2  Plan:  Plan for EGD today with general anesthesia/intubation to evaluate for food impaction and for treatment and potential dilation Start IV Protonix 40 mg Maintain n.p.o. status Labs and imaging reviewed from overnight  Further recommendations pending endoscopy.  Please see report for further details  Esophagogastroduodenoscopy with possible biopsy, control of bleeding, polypectomy, and interventions as necessary has been discussed with the patient/patient representative. Informed consent was obtained from the patient/patient representative after explaining the indication, nature, and risks of the procedure including but not limited to death, bleeding, perforation, missed neoplasm/lesions, cardiorespiratory compromise, and reaction to medications. Opportunity for questions was given and appropriate answers were provided. Patient/patient representative has verbalized understanding is amenable to undergoing the procedure.  I personally performed the service.  Management of other medical comorbidities as per primary team  Thank you for allowing [place] to participate in this patient's care. Please don't hesitate to call if any questions or concerns arise.   [name], DO [facility]  Portions of the record may have been created with voice recognition software. Occasional wrong-word or 'sound-a-like' substitutions may have occurred due to the inherent limitations of voice recognition software.  Read the chart carefully and recognize, using context, where substitutions may have occurred.

## 2022-12-04 NOTE — H&P (View-Only) (Signed)
Inpatient Consultation   Patient ID: Kelsey Haynes is a 88 y.o. female.  Requesting Provider: Lucillie Garfinkel, MD  Date of Admission: 12/03/2022  Date of Consult: 12/04/22   Reason for Consultation: food impaction   Patient's Chief Complaint:   Chief Complaint  Patient presents with   Shortness of Breath    88 year old Caucasian female with history of breast cancer status postradiation and surgery, DM 2, hypertension who presents in evaluation for possible food impaction.  She is accompanied by her daughter at bedside who helps provide history.  Patient was eating [place] or chicken yesterday when she felt like this became lodged.  She has had this happen in the past but says it is as passed its own.  She denies any odynophagia.  Most the time these issues occur with solids.  She has no dysphagia to liquids or pills.  Symptoms have been occurring over the last year.  It sounds like she has seen speech therapy for swallowing issues in the recent past with a normal MBSS.  She denies weight changes or appetite changes.  She is able to converse normally and maintaining her own airway.  Managing her own secretions.  CT demonstrated fluid within the hiatal hernia sac.   Denies NSAIDs, Anti-plt agents, and anticoagulants Denies family history of gastrointestinal disease and malignancy Previous Endoscopies: CSY at age 58 approximately EGD- does not believe she's had one   Past Medical History:  Diagnosis Date   Breast cancer [facility]) right breast   Cancer ([place]) 2006   breast cancer 2006   Depression    Diabetes mellitus without complication ([place])    Hypertension    Personal history of radiation therapy    PONV (postoperative nausea and vomiting)     Past Surgical History:  Procedure Laterality Date   ABDOMINAL HYSTERECTOMY     BREAST BIOPSY Right 2006   +   BREAST CYST ASPIRATION Right    CATARACT EXTRACTION W/PHACO Left 11/14/2019   Procedure: CATARACT EXTRACTION PHACO AND  INTRAOCULAR LENS PLACEMENT ([place]) LEFT DIABETIC;  Surgeon: Birder Robson, MD;  Location: [place];  Service: Ophthalmology;  Laterality: Left;  [place] 1:18 CDE 14.93   CATARACT EXTRACTION W/PHACO Right 12/05/2019   Procedure: CATARACT EXTRACTION PHACO AND INTRAOCULAR LENS PLACEMENT ([facility]) RIGHT DIABETIC 7.58, 00:52.3;  Surgeon: Birder Robson, MD;  Location: [place];  Service: Ophthalmology;  Laterality: Right;   endoscopic carpal tunnel release     HALLUX VALGUS CORRECTION Left 1983   HERNIA REPAIR     x2   left foot surgery Left    LEFT HEART CATH AND CORONARY ANGIOGRAPHY Left 07/06/2019   Procedure: LEFT HEART CATH AND CORONARY ANGIOGRAPHY;  Surgeon: Yolonda Kida, MD;  Location: [place] [facility];  Service: Cardiovascular;  Laterality: Left;   SHOULDER SURGERY Bilateral    SPINE SURGERY     back    Allergies  Allergen Reactions   Other Swelling and Other (See Comments)    Hazel nuts    Family History  Problem Relation Age of Onset   Breast cancer Neg Hx     Social History   Tobacco Use   Smoking status: Former    Packs/day: 0.10    Types: Cigarettes    Quit date: 12/28/1956    Years since quitting: 65.9   Smokeless tobacco: Never   Tobacco comments:    smoked in college  Vaping Use   Vaping Use: Never used  Substance Use Topics  Alcohol use: Not Currently    Comment: 1 drink per month     Pertinent GI related history and allergies were reviewed with the patient  Review of Systems  Constitutional:  Negative for activity change, appetite change, chills, diaphoresis, fatigue, fever and unexpected weight change.  HENT:  Positive for trouble swallowing. Negative for voice change.   Respiratory:  Negative for shortness of breath and wheezing.   Cardiovascular:  Negative for chest pain, palpitations and leg swelling.  Gastrointestinal:  Negative for abdominal distention, abdominal pain, anal bleeding, blood in stool, constipation, diarrhea,  nausea, rectal pain and vomiting.  Musculoskeletal:  Negative for arthralgias and myalgias.  Skin:  Negative for color change and pallor.  Neurological:  Negative for dizziness, syncope and weakness.  Psychiatric/Behavioral:  Negative for confusion.   All other systems reviewed and are negative.    Medications Home Medications No current facility-administered medications on file prior to encounter.   Current Outpatient Medications on File Prior to Encounter  Medication Sig Dispense Refill   acetaminophen (TYLENOL) 325 MG tablet Take 650 mg by mouth every 6 (six) hours as needed for moderate pain or headache.      amLODipine (NORVASC) 10 MG tablet Take 1 tablet (10 mg total) by mouth daily.     gabapentin (NEURONTIN) 100 MG capsule Take 200 mg in the morning, 100 mg in the afternoon, and 100 mg at night for one week, then increase to 200 mg in the morning, 200 mg in the afternoon, and 100 mg at night for one week, then increase to 200 mg three times per day.     ibuprofen (ADVIL) 200 MG tablet Take 200 mg by mouth every 6 (six) hours as needed.     melatonin 3 MG TABS tablet Take 1 tablet by mouth at bedtime.     Multiple Vitamin (MULTI-VITAMINS) TABS Take 1 tablet by mouth daily.      omeprazole (PRILOSEC) 20 MG capsule Take 20 mg by mouth every morning.     Pertinent GI related medications were reviewed with the patient  Inpatient Medications  Current Facility-Administered Medications:    pantoprazole (PROTONIX) injection 40 mg, 40 mg, Intravenous, Q12H, [name], DO  Current Outpatient Medications:    acetaminophen (TYLENOL) 325 MG tablet, Take 650 mg by mouth every 6 (six) hours as needed for moderate pain or headache. , Disp: , Rfl:    amLODipine (NORVASC) 10 MG tablet, Take 1 tablet (10 mg total) by mouth daily., Disp: , Rfl:    gabapentin (NEURONTIN) 100 MG capsule, Take 200 mg in the morning, 100 mg in the afternoon, and 100 mg at night for one week, then increase  to 200 mg in the morning, 200 mg in the afternoon, and 100 mg at night for one week, then increase to 200 mg three times per day., Disp: , Rfl:    ibuprofen (ADVIL) 200 MG tablet, Take 200 mg by mouth every 6 (six) hours as needed., Disp: , Rfl:    melatonin 3 MG TABS tablet, Take 1 tablet by mouth at bedtime., Disp: , Rfl:    Multiple Vitamin (MULTI-VITAMINS) TABS, Take 1 tablet by mouth daily. , Disp: , Rfl:    omeprazole (PRILOSEC) 20 MG capsule, Take 20 mg by mouth every morning., Disp: , Rfl:       Objective   Vitals:   12/04/22 0230 12/04/22 0300 12/04/22 0315 12/04/22 0515  BP: (!) 169/90 (!) 174/84 (!) 162/98 137/89  Pulse: 97 (!) 103 Marland Kitchen)  101 (!) 105  Resp: (!) 22 (!) '21 20 20  '$ Temp:    (!) 97.4 F (36.3 C)  TempSrc:      SpO2: 97% 96% 94% 94%  Weight:      Height:         Physical Exam Vitals and nursing note reviewed.  Constitutional:      General: She is not in acute distress.    Appearance: Normal appearance. She is not ill-appearing, toxic-appearing or diaphoretic.  HENT:     Head: Normocephalic and atraumatic.     Nose: Nose normal.     Mouth/Throat:     Mouth: Mucous membranes are moist.     Pharynx: Oropharynx is clear.  Eyes:     General: No scleral icterus.    Extraocular Movements: Extraocular movements intact.  Cardiovascular:     Rate and Rhythm: Normal rate. Rhythm irregular.     Heart sounds: Normal heart sounds. No murmur heard.    No friction rub. No gallop.  Pulmonary:     Effort: Pulmonary effort is normal. No respiratory distress.     Breath sounds: Normal breath sounds. No wheezing, rhonchi or rales.     Comments: Wearing 3LNC Abdominal:     General: Abdomen is flat. Bowel sounds are normal. There is no distension.     Palpations: Abdomen is soft.     Tenderness: There is no abdominal tenderness. There is no guarding or rebound.  Musculoskeletal:     Cervical back: Neck supple.     Right lower leg: No edema.     Left lower leg: No  edema.  Skin:    General: Skin is warm and dry.     Coloration: Skin is not jaundiced or pale.  Neurological:     General: No focal deficit present.     Mental Status: She is alert and oriented to person, place, and time. Mental status is at baseline.  Psychiatric:        Mood and Affect: Mood normal.        Behavior: Behavior normal.        Thought Content: Thought content normal.        Judgment: Judgment normal.     Laboratory Data Recent Labs  Lab 12/03/22 2237  WBC 11.1*  HGB 13.2  HCT 40.8  PLT 165   Recent Labs  Lab 12/03/22 2237  NA 140  K 3.8  CL 103  CO2 27  BUN 22  CALCIUM 8.9  PROT 7.6  BILITOT 0.5  ALKPHOS 56  ALT 16  AST 18  GLUCOSE 208*   No results for input(s): "INR" in the last 168 hours.  No results for input(s): "LIPASE" in the last 72 hours.      Imaging Studies: CT Chest Wo Contrast  Result Date: 12/03/2022 CLINICAL DATA:  Foreign body aspiration, dyspnea, productive cough EXAM: CT CHEST WITHOUT CONTRAST TECHNIQUE: Multidetector CT imaging of the chest was performed following the standard protocol without IV contrast. RADIATION DOSE REDUCTION: This exam was performed according to the departmental dose-optimization program which includes automated exposure control, adjustment of the mA and/or kV according to patient size and/or use of iterative reconstruction technique. COMPARISON:  Upper gastrointestinal series 01/26/2022 FINDINGS: Cardiovascular: No significant coronary artery calcification. Mild calcification of the mitral valve annulus. Global cardiac size within normal limits. No pericardial effusion. Central pulmonary arteries are of normal caliber. Mild atherosclerotic calcification within the thoracic aorta. No aortic aneurysm. Mediastinum/Nodes: A small sliding hiatal hernia  is present. A large amount of debris is seen within the lumen of the herniated stomach above the level of the diaphragmatic hiatus which may represent ingested food  bolus. A superimposed underlying obstructing lesion such as a stricture may be present at the diaphragmatic hiatus. The esophagus proximally is distended and debris and fluid-filled to the level of the thoracic inlet. No pathologic thoracic adenopathy. No pneumomediastinum. No mediastinal fluid collections or inflammatory stranding identified. Lungs/Pleura: Mild centrilobular nodularity within the left lower lobe is nonspecific but can be seen the setting of acute infection or aspiration. The lungs are otherwise clear. No pneumothorax or pleural effusion. No central obstructing lesion. Upper Abdomen: No acute abnormality. Musculoskeletal: Degenerative changes are seen within the thoracic spine, right sternoclavicular articulation, and left shoulder appear. Remote, healed rib fractures are noted bilaterally. Remote compression deformity of T11 with mild loss of height. No acute bone abnormality. No lytic or blastic bone lesion. IMPRESSION: 1. Small sliding hiatal hernia. Large amount of debris within the lumen of the herniated stomach above the level of the diaphragmatic hiatus which may represent ingested food bolus. A superimposed underlying obstructing lesion such as a stricture is not excluded. The esophagus proximally is distended and debris and fluid-filled to the level of the thoracic inlet. 2. Mild centrilobular nodularity within the left lower lobe is nonspecific but can be seen the setting of acute infection or aspiration. Aortic Atherosclerosis (ICD10-I70.0). Electronically Signed   By: Fidela Salisbury M.D.   On: 12/03/2022 23:12   DG Chest 2 View  Result Date: 12/03/2022 CLINICAL DATA:  Sepsis vomiting EXAM: CHEST - 2 VIEW COMPARISON:  07/12/2022 FINDINGS: Mild cardiomegaly. Mild diffuse chronic interstitial opacity. No consolidation, pleural effusion, or pneumothorax. Aortic atherosclerosis. Old left-sided rib fractures. IMPRESSION: No active cardiopulmonary disease. Mild cardiomegaly. Electronically  Signed   By: Donavan Foil M.D.   On: 12/03/2022 23:00    Assessment:   # Food Impaction  # Irregular heart rhythm - previously evaluated by Dr. Clayborn Bigness  # Breast cancer s/p radiation/surgery # HTN # DM2  Plan:  Plan for EGD today with general anesthesia/intubation to evaluate for food impaction and for treatment and potential dilation Start IV Protonix 40 mg Maintain n.p.o. status Labs and imaging reviewed from overnight  Further recommendations pending endoscopy.  Please see report for further details  Esophagogastroduodenoscopy with possible biopsy, control of bleeding, polypectomy, and interventions as necessary has been discussed with the patient/patient representative. Informed consent was obtained from the patient/patient representative after explaining the indication, nature, and risks of the procedure including but not limited to death, bleeding, perforation, missed neoplasm/lesions, cardiorespiratory compromise, and reaction to medications. Opportunity for questions was given and appropriate answers were provided. Patient/patient representative has verbalized understanding is amenable to undergoing the procedure.  I personally performed the service.  Management of other medical comorbidities as per primary team  Thank you for allowing [place] to participate in this patient's care. Please don't hesitate to call if any questions or concerns arise.   [name], DO [facility]  Portions of the record may have been created with voice recognition software. Occasional wrong-word or 'sound-a-like' substitutions may have occurred due to the inherent limitations of voice recognition software.  Read the chart carefully and recognize, using context, where substitutions may have occurred.

## 2022-12-04 NOTE — Anesthesia Postprocedure Evaluation (Signed)
Anesthesia Post Note  Patient: Dispensing optician  Procedure(s) Performed: ESOPHAGOGASTRODUODENOSCOPY (EGD) WITH PROPOFOL  Patient location during evaluation: PACU Anesthesia Type: General Level of consciousness: awake and alert, oriented and patient cooperative Pain management: pain level controlled Vital Signs Assessment: post-procedure vital signs reviewed and stable Respiratory status: spontaneous breathing, nonlabored ventilation and respiratory function stable Cardiovascular status: blood pressure returned to baseline and stable Postop Assessment: adequate PO intake Anesthetic complications: no   No notable events documented.   Last Vitals:  Vitals:   12/04/22 0939 12/04/22 0944  BP: (!) 127/93 131/74  Pulse: 92 60  Resp: 18 19  Temp:  (!) 36.3 C  SpO2: 99% 99%    Last Pain:  Vitals:   12/04/22 0944  TempSrc: Temporal  PainSc: 0-No pain                 Darrin Nipper

## 2022-12-04 NOTE — ED Notes (Signed)
Pt transported to Endo at this time

## 2022-12-04 NOTE — Anesthesia Procedure Notes (Signed)
Procedure Name: Intubation Date/Time: 12/04/2022 8:27 AM  Performed by: Lia Foyer, CRNAPre-anesthesia Checklist: Patient identified, Emergency Drugs available, Suction available and Patient being monitored Patient Re-evaluated:Patient Re-evaluated prior to induction Oxygen Delivery Method: Circle system utilized Preoxygenation: Pre-oxygenation with 100% oxygen Induction Type: IV induction and Rapid sequence Laryngoscope Size: McGraph and 3 Grade View: Grade I Tube type: Oral Tube size: 6.5 mm Number of attempts: 1 Airway Equipment and Method: Stylet, Oral airway and Video-laryngoscopy Placement Confirmation: ETT inserted through vocal cords under direct vision, positive ETCO2 and breath sounds checked- equal and bilateral Secured at: 18 cm Tube secured with: Tape Dental Injury: Teeth and Oropharynx as per pre-operative assessment

## 2022-12-07 ENCOUNTER — Encounter: Payer: Self-pay | Admitting: Gastroenterology

## 2022-12-08 LAB — SURGICAL PATHOLOGY

## 2022-12-17 ENCOUNTER — Ambulatory Visit
Admission: RE | Admit: 2022-12-17 | Discharge: 2022-12-17 | Disposition: A | Discharge status: Home / Self Care | Payer: Medicare Other | Attending: Gastroenterology | Admitting: Gastroenterology

## 2022-12-17 ENCOUNTER — Encounter
Admission: RE | Disposition: A | Discharge status: Home / Self Care | Payer: Self-pay | Source: Home / Self Care | Attending: Gastroenterology

## 2022-12-17 ENCOUNTER — Ambulatory Visit: Payer: Medicare Other | Admitting: Anesthesiology

## 2022-12-17 ENCOUNTER — Encounter: Payer: Self-pay | Admitting: Gastroenterology

## 2022-12-17 DIAGNOSIS — R1310 Dysphagia, unspecified: Secondary | ICD-10-CM | POA: Diagnosis present

## 2022-12-17 DIAGNOSIS — K2970 Gastritis, unspecified, without bleeding: Secondary | ICD-10-CM | POA: Diagnosis not present

## 2022-12-17 DIAGNOSIS — E1122 Type 2 diabetes mellitus with diabetic chronic kidney disease: Secondary | ICD-10-CM | POA: Diagnosis not present

## 2022-12-17 DIAGNOSIS — K224 Dyskinesia of esophagus: Secondary | ICD-10-CM | POA: Insufficient documentation

## 2022-12-17 DIAGNOSIS — Z923 Personal history of irradiation: Secondary | ICD-10-CM | POA: Insufficient documentation

## 2022-12-17 DIAGNOSIS — Z87891 Personal history of nicotine dependence: Secondary | ICD-10-CM | POA: Diagnosis not present

## 2022-12-17 DIAGNOSIS — I129 Hypertensive chronic kidney disease with stage 1 through stage 4 chronic kidney disease, or unspecified chronic kidney disease: Secondary | ICD-10-CM | POA: Diagnosis not present

## 2022-12-17 DIAGNOSIS — N189 Chronic kidney disease, unspecified: Secondary | ICD-10-CM | POA: Diagnosis not present

## 2022-12-17 DIAGNOSIS — Z79899 Other long term (current) drug therapy: Secondary | ICD-10-CM | POA: Insufficient documentation

## 2022-12-17 DIAGNOSIS — Z853 Personal history of malignant neoplasm of breast: Secondary | ICD-10-CM | POA: Diagnosis not present

## 2022-12-17 HISTORY — DX: Chronic kidney disease, unspecified: N18.9

## 2022-12-17 HISTORY — PX: ESOPHAGOGASTRODUODENOSCOPY: SHX5428

## 2022-12-17 HISTORY — DX: Repeated falls: R29.6

## 2022-12-17 HISTORY — DX: Unspecified asthma, uncomplicated: J45.909

## 2022-12-17 HISTORY — DX: Dysphagia, unspecified: R13.10

## 2022-12-17 LAB — GLUCOSE, CAPILLARY: Glucose-Capillary: 110 mg/dL — ABNORMAL HIGH (ref 70–99)

## 2022-12-17 SURGERY — EGD (ESOPHAGOGASTRODUODENOSCOPY)
Anesthesia: General

## 2022-12-17 MED ORDER — PROPOFOL 500 MG/50ML IV EMUL
INTRAVENOUS | Status: DC | PRN
  Administered 2022-12-17: 100 ug/kg/min via INTRAVENOUS
  Administered 2022-12-17: 75 ug/kg/min via INTRAVENOUS

## 2022-12-17 MED ORDER — LIDOCAINE HCL (PF) 2 % IJ SOLN
INTRAMUSCULAR | Status: AC
  Filled 2022-12-17: qty 5

## 2022-12-17 MED ORDER — PROPOFOL 10 MG/ML IV BOLUS
INTRAVENOUS | Status: DC | PRN
  Administered 2022-12-17: 20 mg via INTRAVENOUS
  Administered 2022-12-17: 10 mg via INTRAVENOUS
  Administered 2022-12-17: 20 mg via INTRAVENOUS

## 2022-12-17 MED ORDER — LIDOCAINE HCL (CARDIAC) PF 100 MG/5ML IV SOSY
PREFILLED_SYRINGE | INTRAVENOUS | Status: DC | PRN
  Administered 2022-12-17: 60 mg via INTRAVENOUS

## 2022-12-17 MED ORDER — SODIUM CHLORIDE 0.9 % IV SOLN: INTRAVENOUS | Status: DC

## 2022-12-17 NOTE — Interval H&P Note (Signed)
History and Physical Interval Note: Preprocedure H&P from 12/17/22  was reviewed and there was no interval change after seeing and examining the patient.  Written consent was obtained from the patient after discussion of risks, benefits, and alternatives. Patient has consented to proceed with Esophagogastroduodenoscopy with possible intervention   12/17/2022 10:17 AM  Kelsey Haynes  has presented today for surgery, with the diagnosis of dysphagia.  The various methods of treatment have been discussed with the patient and family. After consideration of risks, benefits and other options for treatment, the patient has consented to  Procedure(s): ESOPHAGOGASTRODUODENOSCOPY (EGD) (N/A) as a surgical intervention.  The patient's history has been reviewed, patient examined, no change in status, stable for surgery.  I have reviewed the patient's chart and labs.  Questions were answered to the patient's satisfaction.     [name]

## 2022-12-17 NOTE — H&P (Signed)
Pre-Procedure H&P   Patient ID: Kelsey Haynes is a 88 y.o. female.  Gastroenterology Provider: [name], DO   PCP: Kirk Ruths, MD  Date: 12/17/2022  HPI Ms. Kelsey Haynes is a 88 y.o. female who presents today for Esophagogastroduodenoscopy for esophageal stricture, recent food impaction.  Patient with recent food impaction on December 18 with noted esophageal narrowing.  EGD being performed today to reevaluate and potentially dilate.  Patient has no dysphagia to liquids or pills.  No appetite or weight changes.  Continues to be on PPI.   Past Medical History:  Diagnosis Date   Asthma    Breast cancer ([place]) right breast   Cancer ([place]) 2006   breast cancer 2006   Chronic kidney disease    Depression    Diabetes mellitus without complication ([place])    Dysphagia    Falls    Hypertension    Personal history of radiation therapy    PONV (postoperative nausea and vomiting)     Past Surgical History:  Procedure Laterality Date   ABDOMINAL HYSTERECTOMY     BREAST BIOPSY Right 2006   +   BREAST CYST ASPIRATION Right    CATARACT EXTRACTION W/PHACO Left 11/14/2019   Procedure: CATARACT EXTRACTION PHACO AND INTRAOCULAR LENS PLACEMENT ([place]) LEFT DIABETIC;  Surgeon: Birder Robson, MD;  Location: [place];  Service: Ophthalmology;  Laterality: Left;  [place] 1:18 CDE 14.93   CATARACT EXTRACTION W/PHACO Right 12/05/2019   Procedure: CATARACT EXTRACTION PHACO AND INTRAOCULAR LENS PLACEMENT ([facility]) RIGHT DIABETIC 7.58, 00:52.3;  Surgeon: Birder Robson, MD;  Location: [place];  Service: Ophthalmology;  Laterality: Right;   endoscopic carpal tunnel release     ESOPHAGOGASTRODUODENOSCOPY (EGD) WITH PROPOFOL N/A 12/04/2022   Procedure: ESOPHAGOGASTRODUODENOSCOPY (EGD) WITH PROPOFOL;  Surgeon: [name], DO;  Location: [facility] ENDOSCOPY;  Service: Gastroenterology;  Laterality: N/A;  please make 1st case of morning   HALLUX VALGUS  CORRECTION Left 1983   HERNIA REPAIR     x2   left foot surgery Left    LEFT HEART CATH AND CORONARY ANGIOGRAPHY Left 07/06/2019   Procedure: LEFT HEART CATH AND CORONARY ANGIOGRAPHY;  Surgeon: Yolonda Kida, MD;  Location: [place] [facility];  Service: Cardiovascular;  Laterality: Left;   SHOULDER SURGERY Bilateral    SPINE SURGERY     back    Family History No h/o GI disease or malignancy  Review of Systems  Constitutional:  Negative for activity change, appetite change, chills, diaphoresis, fatigue, fever and unexpected weight change.  HENT:  Positive for trouble swallowing. Negative for voice change.   Respiratory:  Negative for shortness of breath and wheezing.   Cardiovascular:  Negative for chest pain, palpitations and leg swelling.  Gastrointestinal:  Negative for abdominal distention, abdominal pain, anal bleeding, blood in stool, constipation, diarrhea, nausea, rectal pain and vomiting.  Musculoskeletal:  Negative for arthralgias and myalgias.  Skin:  Negative for color change and pallor.  Neurological:  Negative for dizziness, syncope and weakness.  Psychiatric/Behavioral:  Negative for confusion.   All other systems reviewed and are negative.    Medications No current facility-administered medications on file prior to encounter.   Current Outpatient Medications on File Prior to Encounter  Medication Sig Dispense Refill   acetaminophen (TYLENOL) 325 MG tablet Take 650 mg by mouth every 6 (six) hours as needed for moderate pain or headache.      amoxicillin (AMOXIL) 875 MG tablet Take 875 mg by mouth 2 (two)  times daily.     cyanocobalamin (VITAMIN B12) 500 MCG tablet Take 500 mcg by mouth daily.     diclofenac Sodium (VOLTAREN ARTHRITIS PAIN) 1 % GEL Apply topically 4 (four) times daily.     donepezil (ARICEPT) 5 MG tablet Take 5 mg by mouth at bedtime.     gabapentin (NEURONTIN) 100 MG capsule Take 400 mg by mouth 2 (two) times daily.     gabapentin (NEURONTIN)  400 MG capsule Take 800 mg by mouth at bedtime.     Multiple Vitamin (MULTI-VITAMINS) TABS Take 1 tablet by mouth daily.      omeprazole (PRILOSEC) 20 MG capsule Take 20 mg by mouth every morning.     amLODipine (NORVASC) 10 MG tablet Take 1 tablet (10 mg total) by mouth daily.     aspirin 81 MG chewable tablet Chew 81 mg by mouth daily.     ibuprofen (ADVIL) 200 MG tablet Take 200 mg by mouth every 6 (six) hours as needed.     melatonin 3 MG TABS tablet Take 5 mg by mouth at bedtime.      Pertinent medications related to GI and procedure were reviewed by me with the patient prior to the procedure   Current Facility-Administered Medications:    0.9 %  sodium chloride infusion, , Intravenous, Continuous, [name], DO  sodium chloride         Allergies  Allergen Reactions   Other Swelling and Other (See Comments)    Hazel nuts   Allergies were reviewed by me prior to the procedure  Objective   Body mass index is 31.77 kg/m. Vitals:   12/17/22 0950  BP: 135/65  Pulse: 69  Resp: 18  Temp: (!) 97.5 F (36.4 C)  TempSrc: Temporal  SpO2: 97%  Weight: 68.9 kg     Physical Exam Vitals and nursing note reviewed.  Constitutional:      General: She is not in acute distress.    Appearance: Normal appearance. She is not ill-appearing, toxic-appearing or diaphoretic.  HENT:     Head: Normocephalic and atraumatic.     Nose: Nose normal.     Mouth/Throat:     Mouth: Mucous membranes are moist.     Pharynx: Oropharynx is clear.  Eyes:     General: No scleral icterus.    Extraocular Movements: Extraocular movements intact.  Cardiovascular:     Rate and Rhythm: Normal rate. Rhythm irregular.     Heart sounds: Normal heart sounds. No murmur heard.    No friction rub. No gallop.  Pulmonary:     Effort: Pulmonary effort is normal. No respiratory distress.     Breath sounds: Normal breath sounds. No wheezing, rhonchi or rales.  Abdominal:     General: Bowel sounds  are normal. There is no distension.     Palpations: Abdomen is soft.     Tenderness: There is no abdominal tenderness. There is no guarding or rebound.  Musculoskeletal:     Cervical back: Neck supple.     Right lower leg: No edema.     Left lower leg: No edema.  Skin:    General: Skin is warm and dry.     Coloration: Skin is not jaundiced or pale.  Neurological:     General: No focal deficit present.     Mental Status: She is alert and oriented to person, place, and time. Mental status is at baseline.  Psychiatric:        Mood and  Affect: Mood normal.        Behavior: Behavior normal.        Thought Content: Thought content normal.        Judgment: Judgment normal.      Assessment:  Ms. Kelsey Haynes is a 88 y.o. female  who presents today for Esophagogastroduodenoscopy for recent food impaction, esophageal stricture.  Plan:  Esophagogastroduodenoscopy with possible intervention today  Esophagogastroduodenoscopy with possible biopsy, control of bleeding, polypectomy, and interventions as necessary has been discussed with the patient/patient representative. Informed consent was obtained from the patient/patient representative after explaining the indication, nature, and risks of the procedure including but not limited to death, bleeding, perforation, missed neoplasm/lesions, cardiorespiratory compromise, and reaction to medications. Opportunity for questions was given and appropriate answers were provided. Patient/patient representative has verbalized understanding is amenable to undergoing the procedure.   [name], DO  [facility]  Portions of the record may have been created with voice recognition software. Occasional wrong-word or 'sound-a-like' substitutions may have occurred due to the inherent limitations of voice recognition software.  Read the chart carefully and recognize, using context, where substitutions may have occurred.

## 2022-12-17 NOTE — Anesthesia Postprocedure Evaluation (Signed)
Anesthesia Post Note  Patient: Dispensing optician  Procedure(s) Performed: ESOPHAGOGASTRODUODENOSCOPY (EGD)  Patient location during evaluation: PACU Anesthesia Type: General Level of consciousness: awake and oriented Pain management: satisfactory to patient Respiratory status: nonlabored ventilation and respiratory function stable Cardiovascular status: stable Anesthetic complications: no  No notable events documented.   Last Vitals:  Vitals:   12/17/22 1100 12/17/22 1109  BP: 137/62 139/84  Pulse: 70 69  Resp: 17 19  Temp:    SpO2: 99% 97%    Last Pain:  Vitals:   12/17/22 1109  TempSrc:   PainSc: 0-No pain                 VAN STAVEREN,Chavon Lucarelli

## 2022-12-17 NOTE — Anesthesia Preprocedure Evaluation (Signed)
Anesthesia Evaluation  Patient identified by MRN, date of birth, ID band Patient awake    Reviewed: Allergy & Precautions, NPO status , Patient's Chart, lab work & pertinent test results  History of Anesthesia Complications (+) PONV and history of anesthetic complications  Airway Mallampati: II  TM Distance: >3 FB Neck ROM: Full    Dental  (+) Teeth Intact   Pulmonary neg pulmonary ROS, asthma , former smoker   Pulmonary exam normal breath sounds clear to auscultation       Cardiovascular Exercise Tolerance: Good hypertension, Pt. on medications negative cardio ROS Normal cardiovascular exam+ dysrhythmias  Rhythm:Regular Rate:Normal     Neuro/Psych    Depression    negative neurological ROS  negative psych ROS   GI/Hepatic negative GI ROS, Neg liver ROS,,,  Endo/Other  negative endocrine ROSdiabetes, Type 2, Oral Hypoglycemic Agents    Renal/GU Renal InsufficiencyRenal diseasenegative Renal ROS  negative genitourinary   Musculoskeletal   Abdominal Normal abdominal exam  (+)   Peds negative pediatric ROS (+)  Hematology negative hematology ROS (+)   Anesthesia Other Findings Past Medical History: No date: Asthma right breast: Breast cancer ([place]) 2006: Cancer ([place])     Comment:  breast cancer 2006 No date: Chronic kidney disease No date: Depression No date: Diabetes mellitus without complication (HCC) No date: Dysphagia No date: Falls No date: Hypertension No date: Personal history of radiation therapy No date: PONV (postoperative nausea and vomiting)  Past Surgical History: No date: ABDOMINAL HYSTERECTOMY 2006: BREAST BIOPSY; Right     Comment:  + No date: BREAST CYST ASPIRATION; Right 11/14/2019: CATARACT EXTRACTION W/PHACO; Left     Comment:  Procedure: CATARACT EXTRACTION PHACO AND INTRAOCULAR               LENS PLACEMENT ([place]) LEFT DIABETIC;  Surgeon: Birder Robson, MD;   Location: [place];  Service:               Ophthalmology;  Laterality: Left;  [place] 1:18 CDE 14.93 12/05/2019: CATARACT EXTRACTION W/PHACO; Right     Comment:  Procedure: CATARACT EXTRACTION PHACO AND INTRAOCULAR               LENS PLACEMENT ([facility]) RIGHT DIABETIC 7.58, 00:52.3;                Surgeon: Birder Robson, MD;  Location: [place];  Service: Ophthalmology;  Laterality: Right; No date: endoscopic carpal tunnel release 12/04/2022: ESOPHAGOGASTRODUODENOSCOPY (EGD) WITH PROPOFOL; N/A     Comment:  Procedure: ESOPHAGOGASTRODUODENOSCOPY (EGD) WITH               PROPOFOL;  Surgeon: [name], DO;  Location:              [place];  Service: Gastroenterology;  Laterality:               N/A;  please make 1st case of morning 1983: [place]; Left No date: HERNIA REPAIR     Comment:  x2 No date: left foot surgery; Left 07/06/2019: LEFT HEART CATH AND CORONARY ANGIOGRAPHY; Left     Comment:  Procedure: LEFT HEART CATH AND CORONARY ANGIOGRAPHY;                Surgeon: Yolonda Kida, MD;  Location: [place]  CV [facility];  Service: Cardiovascular;  Laterality: Left; No date: SHOULDER SURGERY; Bilateral No date: SPINE SURGERY     Comment:  back  BMI    Body Mass Index: 31.77 kg/m      Reproductive/Obstetrics negative OB ROS                             Anesthesia Physical Anesthesia Plan  ASA: 3  Anesthesia Plan: General   Post-op Pain Management:    Induction: Intravenous  PONV Risk Score and Plan: Propofol infusion and TIVA  Airway Management Planned: Natural Airway  Additional Equipment:   Intra-op Plan:   Post-operative Plan:   Informed Consent: I have reviewed the patients History and Physical, chart, labs and discussed the procedure including the risks, benefits and alternatives for the proposed anesthesia with the patient or authorized representative who has  indicated his/her understanding and acceptance.     Dental Advisory Given  Plan Discussed with: CRNA and Surgeon  Anesthesia Plan Comments:        Anesthesia Quick Evaluation

## 2022-12-17 NOTE — Op Note (Signed)
[facility] Gastroenterology Patient Name: Kelsey Haynes Procedure Date: 12/17/2022 10:21 AM MRN: [id number] Account #: [id number] Date of Birth: 1932/01/21 Admit Type: Outpatient Age: 88 Room: [facility] ENDO ROOM 1 Gender: Female Note Status: Finalized Instrument Name: Michaelle Birks [id number] Procedure:             Upper GI endoscopy Indications:           Dysphagia Providers:             [name] DO, DO Medicines:             Monitored Anesthesia Care Complications:         No immediate complications. Estimated blood loss:                         Minimal. Procedure:             Pre-Anesthesia Assessment:                        - Prior to the procedure, a History and Physical was                         performed, and patient medications and allergies were                         reviewed. The patient is competent. The risks and                         benefits of the procedure and the sedation options and                         risks were discussed with the patient. All questions                         were answered and informed consent was obtained.                         Patient identification and proposed procedure were                         verified by the physician, the nurse, the anesthetist                         and the technician in the endoscopy suite. Mental                         Status Examination: alert and oriented. Airway                         Examination: normal oropharyngeal airway and neck                         mobility. Respiratory Examination: clear to                         auscultation. CV Examination: irregularly irregular                         rate and rhythm. Prophylactic Antibiotics: The patient  does not require prophylactic antibiotics. Prior                         Anticoagulants: The patient has taken no anticoagulant                         or antiplatelet agents. ASA Grade Assessment:  III - A                         patient with severe systemic disease. After reviewing                         the risks and benefits, the patient was deemed in                         satisfactory condition to undergo the procedure. The                         anesthesia plan was to use monitored anesthesia care                         (MAC). Immediately prior to administration of                         medications, the patient was re-assessed for adequacy                         to receive sedatives. The heart rate, respiratory                         rate, oxygen saturations, blood pressure, adequacy of                         pulmonary ventilation, and response to care were                         monitored throughout the procedure. The physical                         status of the patient was re-assessed after the                         procedure.                        After obtaining informed consent, the endoscope was                         passed under direct vision. Throughout the procedure,                         the patient's blood pressure, pulse, and oxygen                         saturations were monitored continuously. The Endoscope                         was introduced through the mouth, and advanced to the  second part of duodenum. The upper GI endoscopy was                         accomplished without difficulty. The patient tolerated                         the procedure well. Findings:      The duodenal bulb, first portion of the duodenum and second portion of       the duodenum were normal. Estimated blood loss: none.      Localized mild inflammation characterized by improved from previous       endoscopy was found in the cardia. Biopsies were taken with a cold       forceps for histology. Estimated blood loss was minimal.      The exam of the stomach was otherwise normal.      The Z-line was regular. Estimated blood loss: none.       Esophagogastric landmarks were identified: the gastroesophageal junction       was found at 35 cm from the incisors.      Normal mucosa was found in the entire esophagus. Estimated blood loss:       none.      Abnormal motility was noted in the esophagus. The cricopharyngeus was       normal. There is spasticity of the esophageal body. The distal       esophagus/lower esophageal sphincter is open. Tertiary peristaltic waves       are noted. A guidewire was placed and the scope was withdrawn. Dilation       was performed with a Savary dilator with no resistance at 36 Fr, 39 Fr,       45 Fr and 48 Fr. The dilation site was examined following endoscope       reinsertion and showed mild mucosal disruption after 48 [place]. No       disruption with other sizes. Estimated blood loss was minimal. Impression:            - Normal duodenal bulb, first portion of the duodenum                         and second portion of the duodenum.                        - Gastritis. Biopsied.                        - Z-line regular.                        - Esophagogastric landmarks identified.                        - Normal mucosa was found in the entire esophagus.                        - Abnormal esophageal motility, consistent with                         presbyesophagus. Dilated. Recommendation:        - Patient has a contact number available for  emergencies. The signs and symptoms of potential                         delayed complications were discussed with the patient.                         Return to normal activities tomorrow. Written                         discharge instructions were provided to the patient.                        - Discharge patient to home.                        - Soft diet today.                        - Continue present medications.                        - No aspirin, ibuprofen, naproxen, or other                         non-steroidal anti-inflammatory  drugs for 5 days.                        - Await pathology results.                        - Return to GI clinic as previously scheduled.                        - The findings and recommendations were discussed with                         the patient.                        - The findings and recommendations were discussed with                         the patient's family. Procedure Code(s):     --- Professional ---                        [id number], 51, Esophagogastroduodenoscopy, flexible,                         transoral; with insertion of guide wire followed by                         passage of dilator(s) through esophagus over guide wire                        43239, 59,51, Esophagogastroduodenoscopy, flexible,                         transoral; with biopsy, single or multiple Diagnosis Code(s):     --- Professional ---  K29.70, Gastritis, unspecified, without bleeding                        K22.4, Dyskinesia of esophagus                        R13.10, Dysphagia, unspecified CPT copyright 2022 American Medical Association. All rights reserved. The codes documented in this report are preliminary and upon coder review may  be revised to meet current compliance requirements. Attending Participation:      I personally performed the entire procedure. Volney American, DO [name] DO, DO 12/17/2022 10:53:45 AM This report has been signed electronically. Number of Addenda: 0 Note Initiated On: 12/17/2022 10:21 AM Estimated Blood Loss:  Estimated blood loss was minimal.      [facility]

## 2022-12-17 NOTE — Transfer of Care (Signed)
Immediate Anesthesia Transfer of Care Note  Patient: Kelsey Haynes  Procedure(s) Performed: ESOPHAGOGASTRODUODENOSCOPY (EGD)  Patient Location: PACU  Anesthesia Type:General  Level of Consciousness: sedated  Airway & Oxygen Therapy: Patient Spontanous Breathing and Patient connected to nasal cannula oxygen  Post-op Assessment: Report given to RN and Post -op Vital signs reviewed and stable  Post vital signs: Reviewed and stable  Last Vitals:  Vitals Value Taken Time  BP 119/49 12/17/22 1048  Temp    Pulse 72 12/17/22 1049  Resp 14 12/17/22 1049  SpO2 96 % 12/17/22 1049  Vitals shown include unvalidated device data.  Last Pain:  Vitals:   12/17/22 0950  TempSrc: Temporal         Complications: No notable events documented.

## 2022-12-18 LAB — SURGICAL PATHOLOGY

## 2023-12-11 IMAGING — RF DG UGI W/ HIGH DENSITY W/O KUB
8 of 11 series · 14 of 24 positions shown · non-contrast
Comparison: Modified barium swallow dated October 30, 2021

CLINICAL DATA: [place]ro pharyngeal dysphagia

EXAM:
UPPER GI SERIES WITH HIGH DENSITY WITHOUT KUB
TECHNIQUE: Combined double and single contrast examination was performed using
effervescent crystals, high-density barium and thin liquid barium.
This exam was performed by Mulyokela Tileinge. PA, and was supervised and
interpreted by Thiessen, Candii.
FLUOROSCOPY TIME:  Radiation Exposure Index (as provided by the
fluoroscopic device): 22.6 mGy
If the device does not provide the exposure index:
Fluoroscopy Time:  2.7 minutes
Number of Acquired Images:  23

[Series 5: cp_standard · 0.18mm/px · 1 of 1 slices shown (1 of 7)]
[im 1/1]
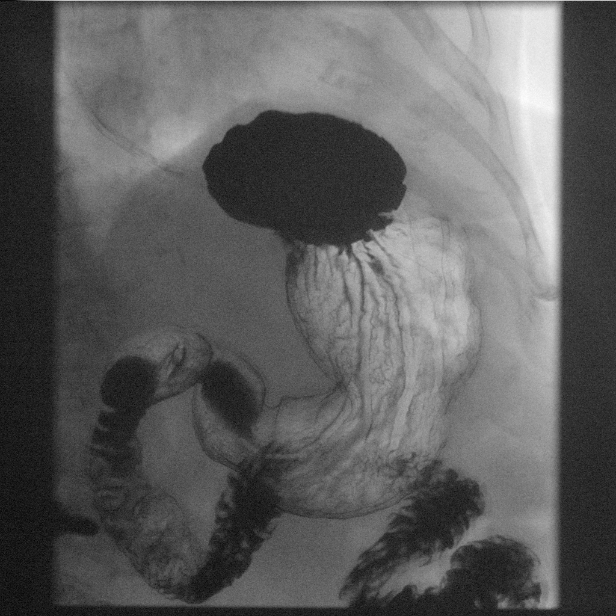

[Series 7: fluoro_barium singleshot_bw · 0.19mm/px · 1 of 1 slices shown]
[im 1/1]
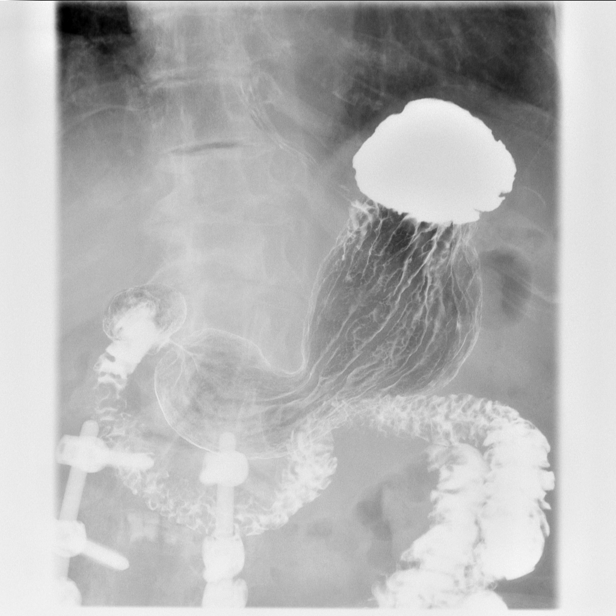

[Series 9: cp_standard · 0.30mm/px · 2 of 165 frames shown (2 of 7)]
[frame 56/165]
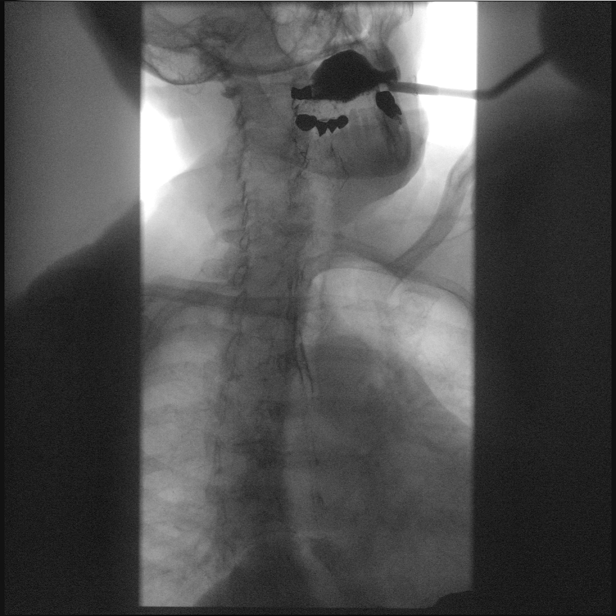
[frame 141/165]
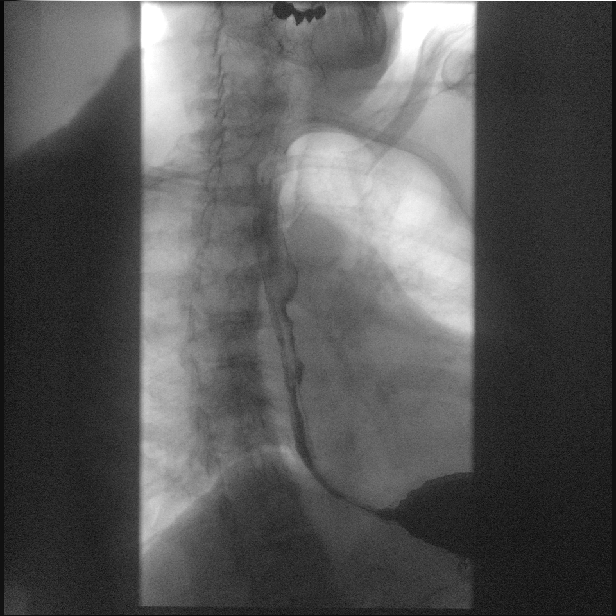

[Series 10: cp_standard · 0.30mm/px · 2 of 157 frames shown (3 of 7)]
[frame 23/157]
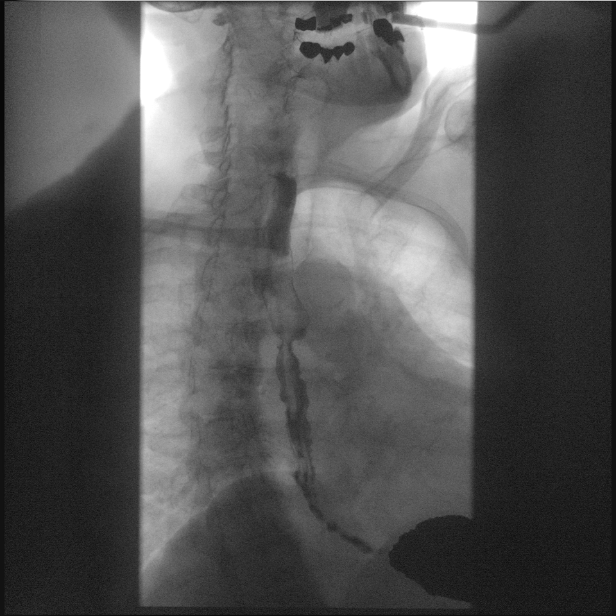
[frame 134/157]
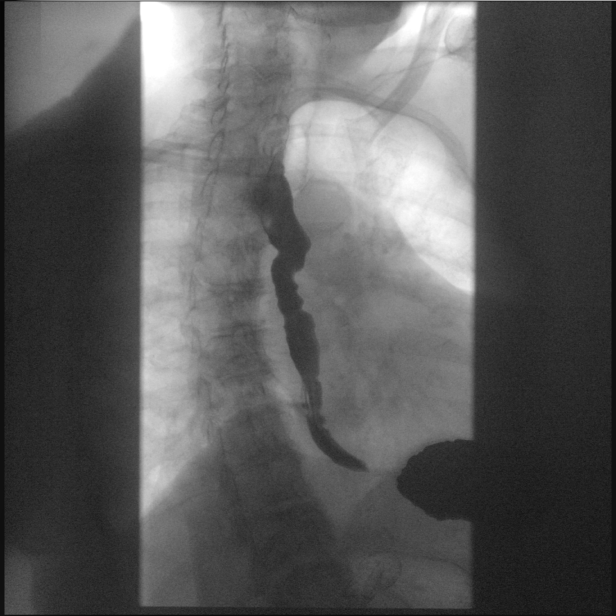

[Series 12: cp_standard · 0.30mm/px · 3 of 39 frames shown (4 of 7)]
[frame 6/39]
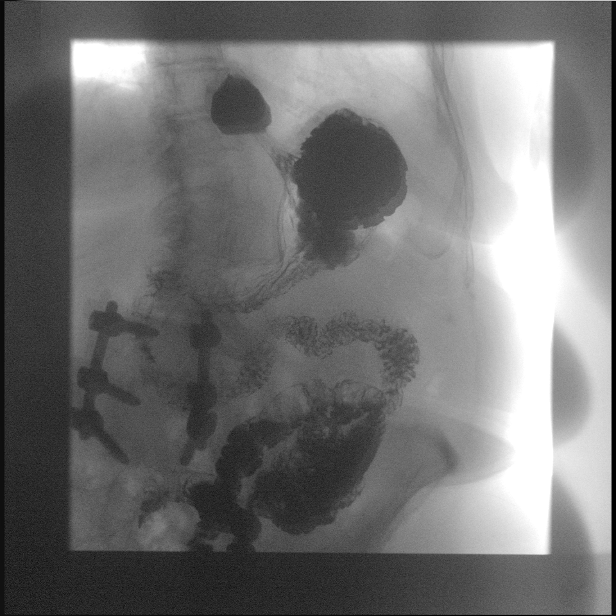
[frame 12/39]
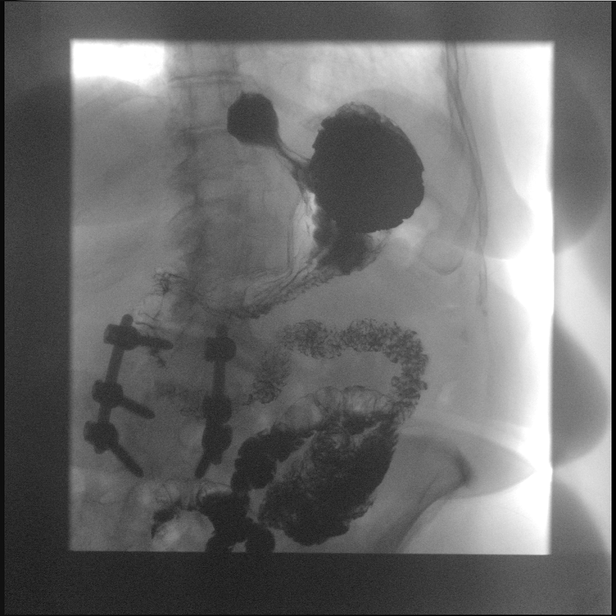
[frame 34/39]
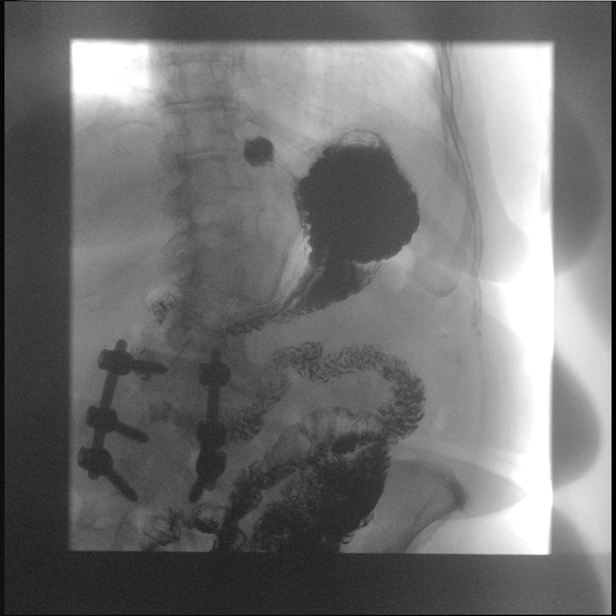

[Series 15: cp_standard · 0.20mm/px · 1 of 1 slices shown (5 of 7)]
[im 1/1]
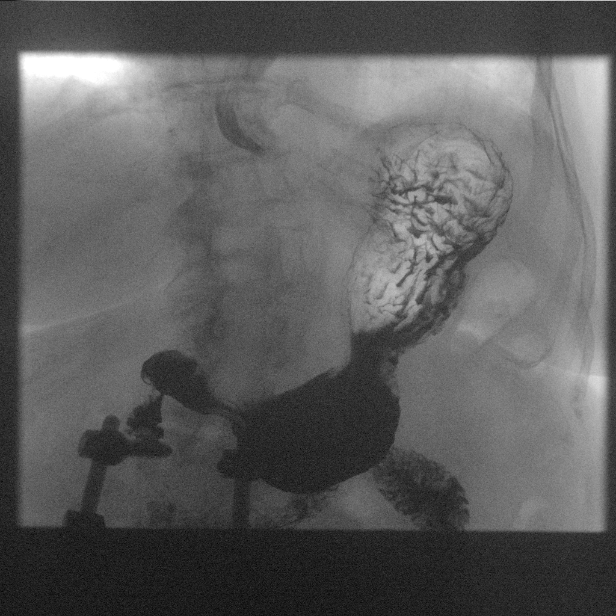

[Series 16: cp_standard · 0.27mm/px · 2 of 116 frames shown (6 of 7)]
[frame 59/116]
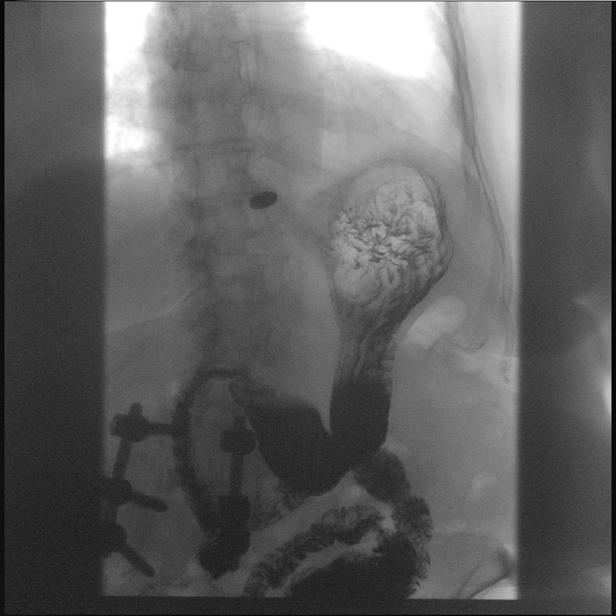
[frame 99/116]
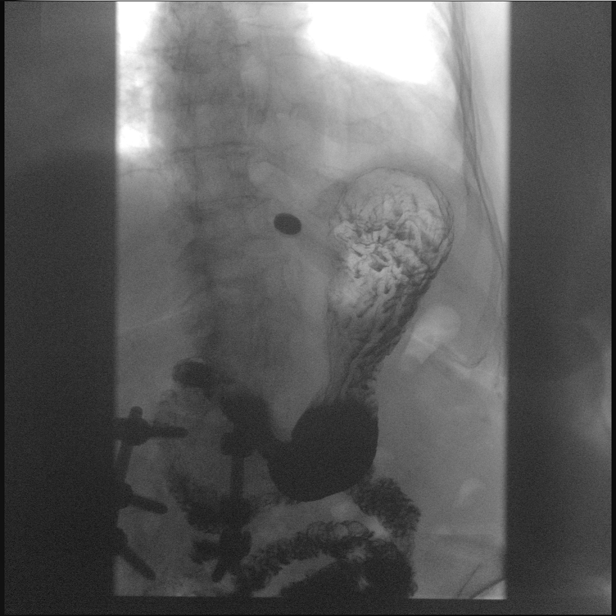

[Series 17: cp_standard · 0.27mm/px · 2 of 31 frames shown (7 of 7)]
[frame 16/31]
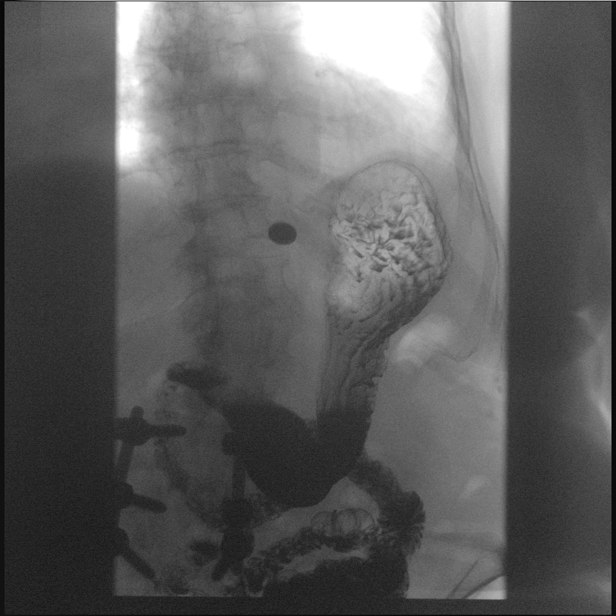
[frame 31/31]
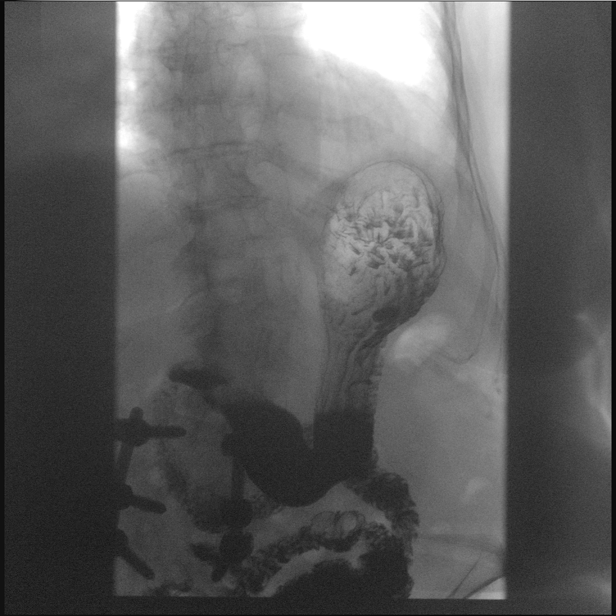

[14 of 24 positions shown; findings below may reference images not displayed]

FINDINGS: Esophagus: Normal appearance.

Esophageal motility: Moderate esophageal dysmotility.

Gastroesophageal reflux: Visualized.

Ingested 13mm barium tablet: Passed normally.

Stomach: Normal appearance.  Small hiatal hernia.

Gastric emptying: Normal.

Duodenum: Normal appearance.

Other: True lateral images of the stomach could not be obtained
since patient could not fully roll onto side.
IMPRESSION: 1. Moderate esophageal dysmotility.
2. Gastroesophageal reflux visualized.
3. Small hiatal hernia.

## 2024-04-21 ENCOUNTER — Emergency Department

## 2024-04-21 ENCOUNTER — Emergency Department
Admission: EM | Admit: 2024-04-21 | Discharge: 2024-04-22 | Disposition: A | Discharge status: Home / Self Care | Attending: Emergency Medicine | Admitting: Emergency Medicine

## 2024-04-21 ENCOUNTER — Other Ambulatory Visit: Payer: Self-pay

## 2024-04-21 DIAGNOSIS — R410 Disorientation, unspecified: Secondary | ICD-10-CM | POA: Insufficient documentation

## 2024-04-21 DIAGNOSIS — F0390 Unspecified dementia without behavioral disturbance: Secondary | ICD-10-CM | POA: Diagnosis not present

## 2024-04-21 DIAGNOSIS — I10 Essential (primary) hypertension: Secondary | ICD-10-CM

## 2024-04-21 DIAGNOSIS — N189 Chronic kidney disease, unspecified: Secondary | ICD-10-CM | POA: Insufficient documentation

## 2024-04-21 DIAGNOSIS — I129 Hypertensive chronic kidney disease with stage 1 through stage 4 chronic kidney disease, or unspecified chronic kidney disease: Secondary | ICD-10-CM | POA: Diagnosis not present

## 2024-04-21 DIAGNOSIS — R079 Chest pain, unspecified: Secondary | ICD-10-CM

## 2024-04-21 DIAGNOSIS — E1122 Type 2 diabetes mellitus with diabetic chronic kidney disease: Secondary | ICD-10-CM | POA: Insufficient documentation

## 2024-04-21 LAB — COMPREHENSIVE METABOLIC PANEL WITH GFR
ALT: 17 U/L (ref 0–44)
AST: 19 U/L (ref 15–41)
Albumin: 4.3 g/dL (ref 3.5–5.0)
Alkaline Phosphatase: 51 U/L (ref 38–126)
Anion gap: 11 (ref 5–15)
BUN: 26 mg/dL — ABNORMAL HIGH (ref 8–23)
CO2: 27 mmol/L (ref 22–32)
Calcium: 9.2 mg/dL (ref 8.9–10.3)
Chloride: 102 mmol/L (ref 98–111)
Creatinine, Ser: 1.03 mg/dL — ABNORMAL HIGH (ref 0.44–1.00)
GFR, Estimated: 52 mL/min — ABNORMAL LOW (ref >60)
Glucose, Bld: 139 mg/dL — ABNORMAL HIGH (ref 70–99)
Potassium: 4.1 mmol/L (ref 3.5–5.1)
Sodium: 140 mmol/L (ref 135–145)
Total Bilirubin: 0.7 mg/dL (ref 0.0–1.2)
Total Protein: 7 g/dL (ref 6.5–8.1)

## 2024-04-21 LAB — BRAIN NATRIURETIC PEPTIDE: B Natriuretic Peptide: 168.9 pg/mL — ABNORMAL HIGH (ref 0.0–100.0)

## 2024-04-21 LAB — CBC WITH DIFFERENTIAL/PLATELET
Abs Immature Granulocytes: 0.02 10*3/uL (ref 0.00–0.07)
Basophils Absolute: 0 10*3/uL (ref 0.0–0.1)
Basophils Relative: 0 %
Eosinophils Absolute: 0.1 10*3/uL (ref 0.0–0.5)
Eosinophils Relative: 1 %
HCT: 41.5 % (ref 36.0–46.0)
Hemoglobin: 13.9 g/dL (ref 12.0–15.0)
Immature Granulocytes: 0 %
Lymphocytes Relative: 40 %
Lymphs Abs: 2.3 10*3/uL (ref 0.7–4.0)
MCH: 30.2 pg (ref 26.0–34.0)
MCHC: 33.5 g/dL (ref 30.0–36.0)
MCV: 90 fL (ref 80.0–100.0)
Monocytes Absolute: 0.6 10*3/uL (ref 0.1–1.0)
Monocytes Relative: 10 %
Neutro Abs: 2.8 10*3/uL (ref 1.7–7.7)
Neutrophils Relative %: 49 %
Platelets: 173 10*3/uL (ref 150–400)
RBC: 4.61 MIL/uL (ref 3.87–5.11)
RDW: 12.4 % (ref 11.5–15.5)
WBC: 5.8 10*3/uL (ref 4.0–10.5)
nRBC: 0 % (ref 0.0–0.2)

## 2024-04-21 LAB — TROPONIN I (HIGH SENSITIVITY)
Troponin I (High Sensitivity): 15 ng/L (ref <18)
Troponin I (High Sensitivity): 17 ng/L (ref <18)

## 2024-04-21 MED ORDER — LOSARTAN POTASSIUM 50 MG PO TABS: 50.0000 mg | ORAL_TABLET | Freq: Every day | ORAL | 11 refills | Status: AC

## 2024-04-21 MED ORDER — LABETALOL HCL 5 MG/ML IV SOLN
10.0000 mg | Freq: Once | INTRAVENOUS | Status: AC
  Administered 2024-04-21: 10 mg via INTRAVENOUS
  Filled 2024-04-21: qty 4

## 2024-04-21 NOTE — Discharge Instructions (Addendum)
 Please start taking losartan in addition to your other blood pressure medications Please follow-up with your primary care provider as soon as possible for continued management of your blood pressure medications

## 2024-04-21 NOTE — ED Triage Notes (Signed)
 Pt BIB [facility] from [place] c/o chestpain that started today. Pt has hx dementia and is confused at baseline however facility states she is "more confused".    190/90 101 HR 98 RA

## 2024-04-21 NOTE — ED Notes (Signed)
 Sandwich tray provided

## 2024-04-21 NOTE — ED Notes (Signed)
 Attempted to call report x3 to the [facility]. Unable to get in contact with anymore. Husband updated on d/c instructions. [facility] called for transport

## 2024-04-21 NOTE — ED Provider Notes (Signed)
 [facility] Provider Note   Event Date/Time   First MD Initiated Contact with Patient 04/21/24 1817     (approximate) History  Chest Pain  HPI Kelsey Haynes is a 89 y.o. female with a past medical history of dementia, hypertension, type 2 diabetes, and chronic kidney disease who presents from [facility] via EMS after complaining of an episode of chest pain prior to arrival.  Facility states the patient is more confused than she normally is however on arrival patient is pleasant, cooperative, and states that she does not remember the event of chest pain today.  Patient denies any other complaints and does not know why she is here.  EMS states the facility gave patient 324 of aspirin  prior to arrival ROS: Patient currently denies any vision changes, tinnitus, difficulty speaking, facial droop, sore throat, chest pain, shortness of breath, abdominal pain, nausea/vomiting/diarrhea, dysuria, or weakness/numbness/paresthesias in any extremity   Physical Exam  Triage Vital Signs: ED Triage Vitals  Encounter Vitals Group     BP      Systolic BP Percentile      Diastolic BP Percentile      Pulse      Resp      Temp      Temp src      SpO2      Weight      Height      Head Circumference      Peak Flow      Pain Score      Pain Loc      Pain Education      Exclude from Growth Chart    Most recent vital signs: Vitals:   04/21/24 2130 04/21/24 2138  BP: (!) 179/81   Pulse: 82 84  Resp: 18 15  Temp:  98.2 F (36.8 C)  SpO2: 98% 98%   General: Awake, oriented x4. CV:  Good peripheral perfusion.  Resp:  Normal effort.  Abd:  No distention.  Other:  Elderly overweight Caucasian female resting comfortably in no acute distress ED Results / Procedures / Treatments  Labs (all labs ordered are listed, but only abnormal results are displayed) Labs Reviewed  COMPREHENSIVE METABOLIC PANEL WITH GFR - Abnormal; Notable for the following components:       Result Value   Glucose, Bld 139 (*)    BUN 26 (*)    Creatinine, Ser 1.03 (*)    GFR, Estimated 52 (*)    All other components within normal limits  BRAIN NATRIURETIC PEPTIDE - Abnormal; Notable for the following components:   B Natriuretic Peptide 168.9 (*)    All other components within normal limits  CBC WITH DIFFERENTIAL/PLATELET  URINALYSIS, ROUTINE W REFLEX MICROSCOPIC  TROPONIN I (HIGH SENSITIVITY)  TROPONIN I (HIGH SENSITIVITY)   EKG ED ECG REPORT I, Charleen Conn, the attending physician, personally viewed and interpreted this ECG. Date: 04/21/2024 EKG Time: 1820 Rate: 94 Rhythm: normal sinus rhythm QRS Axis: normal Intervals: normal ST/T Wave abnormalities: normal Narrative Interpretation: no evidence of acute ischemia RADIOLOGY ED MD interpretation: Single view portable chest x-ray shows a patchy right basilar consolidation most likely consistent with atelectasis -Agree with radiology assessment Official radiology report(s): DG Chest Port 1 View Result Date: 04/21/2024 CLINICAL DATA:  Chest pain, confusion, dementia EXAM: PORTABLE CHEST 1 VIEW COMPARISON:  12/03/2022 FINDINGS: Single frontal view of the chest demonstrates an unremarkable cardiac silhouette. There is patchy consolidation at the right lung base which may reflect atelectasis  or airspace disease. No effusion or pneumothorax. No acute bony abnormalities. IMPRESSION: 1. Patchy right basilar consolidation, most consistent with atelectasis or airspace disease. Electronically Signed   By: Bobbye Burrow M.D.   On: 04/21/2024 19:14   PROCEDURES: Critical Care performed: No .1-3 Lead EKG Interpretation  Performed by: Charleen Conn, MD Authorized by: Charleen Conn, MD     Interpretation: normal     ECG rate:  81   ECG rate assessment: normal     Rhythm: sinus rhythm     Ectopy: none     Conduction: normal    MEDICATIONS ORDERED IN ED: Medications  labetalol  (NORMODYNE ) injection 10 mg (10 mg  Intravenous Given 04/21/24 1837)  labetalol  (NORMODYNE ) injection 10 mg (10 mg Intravenous Given 04/21/24 2135)   IMPRESSION / MDM / ASSESSMENT AND PLAN / ED COURSE  I reviewed the triage vital signs and the nursing notes.                             The patient is on the cardiac monitor to evaluate for evidence of arrhythmia and/or significant heart rate changes. Patient's presentation is most consistent with acute presentation with potential threat to life or bodily function. Presents to the emergency department complaining of high blood pressure. Patient is otherwise asymptomatic without confusion, chest pain, hematuria, or SOB. Denies nonadherence to antihypertensive regimen DDx: CV, AMI, heart failure, renal infarction or failure or other end organ damage.  Given persistent hypertension throughout her emergency department course, will add losartan 50 mg daily  Disposition: Discussed with patient their elevated blood pressure and need for close outpatient management of their hypertension. Will provide a prescription for the patient's previous antihypertensive medication and arrange for the patient to follow up in a primary care clinic    FINAL CLINICAL IMPRESSION(S) / ED DIAGNOSES   Final diagnoses:  Uncontrolled hypertension  Nonspecific chest pain   Rx / DC Orders   ED Discharge Orders          Ordered    losartan (COZAAR) 50 MG tablet  Daily        04/21/24 2146           Note:  This document was prepared using Dragon voice recognition software and may include unintentional dictation errors.   Almena Hokenson K, MD 04/21/24 [phone]

## 2024-04-22 NOTE — ED Notes (Signed)
 Pt brief changed. [facility] at bedside to transport  pt

## 2024-09-25 ENCOUNTER — Other Ambulatory Visit: Payer: Self-pay

## 2024-09-25 ENCOUNTER — Emergency Department: Admission: EM | Admit: 2024-09-25 | Discharge: 2024-09-25 | Disposition: A | Discharge status: Home / Self Care

## 2024-09-25 ENCOUNTER — Emergency Department

## 2024-09-25 DIAGNOSIS — I129 Hypertensive chronic kidney disease with stage 1 through stage 4 chronic kidney disease, or unspecified chronic kidney disease: Secondary | ICD-10-CM | POA: Insufficient documentation

## 2024-09-25 DIAGNOSIS — R1310 Dysphagia, unspecified: Secondary | ICD-10-CM | POA: Insufficient documentation

## 2024-09-25 DIAGNOSIS — N189 Chronic kidney disease, unspecified: Secondary | ICD-10-CM | POA: Insufficient documentation

## 2024-09-25 DIAGNOSIS — J45909 Unspecified asthma, uncomplicated: Secondary | ICD-10-CM | POA: Diagnosis not present

## 2024-09-25 DIAGNOSIS — E1122 Type 2 diabetes mellitus with diabetic chronic kidney disease: Secondary | ICD-10-CM | POA: Diagnosis not present

## 2024-09-25 MED ORDER — OMEPRAZOLE MAGNESIUM 20 MG PO TBEC: 20.0000 mg | DELAYED_RELEASE_TABLET | Freq: Every day | ORAL | 0 refills | Status: AC

## 2024-09-25 NOTE — Discharge Instructions (Addendum)
 Your evaluation in the emergency department was overall reassuring, we saw no evidence of esophageal obstruction today.  I have restarted you on an antacid medication and I recommend you call your gastroenterology clinic (Dr. Aundria) to get reevaluated (tell them you are following up from the emergency department), as you may benefit from a repeat endoscopy though I defer to the gastroenterologist on a final plan for this.

## 2024-09-25 NOTE — ED Provider Notes (Signed)
 [facility] Provider Note    Event Date/Time   First MD Initiated Contact with Patient 09/25/24 1107     (approximate)   History   Trouble swalloiwng  Pt comes with c/o trouble swallowing last night. Pt states it went away. Pt states today she was having trouble with her pills going down and that it did hurt.   Pt did have her esophagus stretched in the past.    HPI Kelsey Haynes is a 89 y.o. female PMH diabetes, hypertension, dysphagia, asthma, CKD presents for evaluation of pain with swallowing pills - Patient reportedly had an episode of several hours yesterday where she kept spitting up saliva and mucus.  This resolved after several hours.  Then this morning she took her pills with applesauce and noted some discomfort in her throat.  Was sent to ED for eval. - Currently asymptomatic. - Denies chest pain, shortness of breath, difficulty swallowing  Per chart review, last EGD 11/2022, Dr. Onita.  Appears esophageal dilatation was performed at that time as well.:  Impression:             - Normal duodenal bulb, first portion of the duodenum      and second portion of the duodenum.                        - Mucosal changes in the cardia. Biopsied.                        - A medium amount of food (residue) in the stomach.                        - Esophagogastric landmarks identified.                        - Z-line regular.                        - Esophageal mucosal changes were present, including                         congestion (edema) and erythema. Findings are                         suggestive of inflammation.                        - Food in the mid esophagus and in the distal                         esophagus.     Physical Exam   Triage Vital Signs: ED Triage Vitals [09/25/24 1011]  Encounter Vitals Group     BP (!) 148/59     Girls Systolic BP Percentile      Girls Diastolic BP Percentile      Boys Systolic BP Percentile      Boys  Diastolic BP Percentile      Pulse Rate 65     Resp 18     Temp 98.4 F (36.9 C)     Temp src      SpO2 96 %     Weight 150 lb (68 kg)     Height 4' 10 (1.473 m)     Head Circumference  Peak Flow      Pain Score 3     Pain Loc      Pain Education      Exclude from Growth Chart     Most recent vital signs: Vitals:   09/25/24 1011  BP: (!) 148/59  Pulse: 65  Resp: 18  Temp: 98.4 F (36.9 C)  SpO2: 96%     General: Awake, no distress.  HEENT: Oropharyngeal exam unremarkable, no pooling secretions, tolerating liquids and solids without difficulty CV:  Good peripheral perfusion. RRR, RP 2+ Resp:  Normal effort. CTAB Abd:  No distention. Nontender to deep palpation throughout    ED Results / Procedures / Treatments   Labs (all labs ordered are listed, but only abnormal results are displayed) Labs Reviewed - No data to display   EKG  N/a   RADIOLOGY Chest x-ray interpreted by myself and radiology report reviewed.  No evidence of aspiration on my read.    PROCEDURES:  Critical Care performed: No  Procedures   MEDICATIONS ORDERED IN ED: Medications - No data to display   IMPRESSION / MDM / ASSESSMENT AND PLAN / ED COURSE  I reviewed the triage vital signs and the nursing notes.                              DDX/MDM/AP: Differential diagnosis includes, but is not limited to, likely ongoing/recurrent esophageal stricture though no clear evidence of food impaction at this time as patient is very well-appearing, not spitting, tolerating secretions and reportedly tolerating orally this morning without difficulty.  Consider possible recent aspiration.  No findings to suggest stroke in this patient with known esophageal stricture and recurrence of her prior symptoms of some difficulty with swallowing.  Plan: - P.o. challenge -chest x-ray - No indication for labs at this time  Patient's presentation is most consistent with exacerbation of chronic  illness.  The patient is on the cardiac monitor to evaluate for evidence of arrhythmia and/or significant heart rate changes.  ED course below.  Patient tolerating both water and applesauce here without any difficulty, do not suspect food impaction at this time.  Is already established with gastroenterology in outpatient setting, recommend she follow-up with them and defer to gastroenterology on whether patient may benefit from repeat EGD for evaluation of possible esophageal stricture or other pathology.  Restarted omeprazole.  Chest x-ray with no evidence of underlying aspiration and patient with no respiratory symptoms.  Also plan for PMD follow-up.  ED return precautions in place.  Patient and family agree with plan.  Clinical Course as of 09/25/24 1345  Mon Sep 25, 2024  1138 Patient tolerated fluids without difficulty  Will trial applesauce [MM]  1255 Patient tolerated applesauce without difficulty  Awaiting formal read of chest x-ray.  Unremarkable on my interpretation. [MM]  1322 Formal CXR read: IMPRESSION: No acute cardiopulmonary findings.   [MM]    Clinical Course User Index [MM] Clarine Ozell LABOR, MD     FINAL CLINICAL IMPRESSION(S) / ED DIAGNOSES   Final diagnoses:  Dysphagia, unspecified type     Rx / DC Orders   ED Discharge Orders          Ordered    omeprazole (PRILOSEC OTC) 20 MG tablet  Daily        09/25/24 1341             Note:  This document was prepared using Dragon voice recognition  software and may include unintentional dictation errors.   Clarine Ozell LABOR, MD 09/25/24 1345

## 2024-09-25 NOTE — ED Triage Notes (Addendum)
 Pt comes with c/o trouble swallowing last night. Pt states it went away. Pt states today she was having trouble with her pills going down and that it did hurt.   Pt did have her esophagus stretched in the past.

## 2024-10-23 ENCOUNTER — Other Ambulatory Visit: Payer: Self-pay | Admitting: Internal Medicine

## 2024-10-23 DIAGNOSIS — T18108S Unspecified foreign body in esophagus causing other injury, sequela: Secondary | ICD-10-CM

## 2024-10-23 DIAGNOSIS — R1314 Dysphagia, pharyngoesophageal phase: Secondary | ICD-10-CM

## 2024-10-27 ENCOUNTER — Ambulatory Visit
Admission: RE | Admit: 2024-10-27 | Discharge: 2024-10-27 | Disposition: A | Discharge status: Home / Self Care | Source: Ambulatory Visit | Attending: Internal Medicine | Admitting: Internal Medicine

## 2024-10-27 ENCOUNTER — Other Ambulatory Visit: Payer: Self-pay | Admitting: Internal Medicine

## 2024-10-27 DIAGNOSIS — T18108S Unspecified foreign body in esophagus causing other injury, sequela: Secondary | ICD-10-CM

## 2024-10-27 DIAGNOSIS — R1314 Dysphagia, pharyngoesophageal phase: Secondary | ICD-10-CM | POA: Insufficient documentation

## 2024-10-28 ENCOUNTER — Emergency Department

## 2024-10-28 ENCOUNTER — Other Ambulatory Visit: Payer: Self-pay

## 2024-10-28 ENCOUNTER — Observation Stay
Admission: EM | Admit: 2024-10-28 | Discharge: 2024-10-31 | Disposition: A | Discharge status: Skilled Nursing Facility | Attending: Internal Medicine | Admitting: Internal Medicine

## 2024-10-28 DIAGNOSIS — J45909 Unspecified asthma, uncomplicated: Secondary | ICD-10-CM | POA: Diagnosis present

## 2024-10-28 DIAGNOSIS — N1831 Chronic kidney disease, stage 3a: Secondary | ICD-10-CM | POA: Insufficient documentation

## 2024-10-28 DIAGNOSIS — N179 Acute kidney failure, unspecified: Secondary | ICD-10-CM | POA: Diagnosis not present

## 2024-10-28 DIAGNOSIS — Z79899 Other long term (current) drug therapy: Secondary | ICD-10-CM | POA: Diagnosis not present

## 2024-10-28 DIAGNOSIS — Z794 Long term (current) use of insulin: Secondary | ICD-10-CM | POA: Diagnosis not present

## 2024-10-28 DIAGNOSIS — R0781 Pleurodynia: Secondary | ICD-10-CM | POA: Diagnosis present

## 2024-10-28 DIAGNOSIS — I10 Essential (primary) hypertension: Secondary | ICD-10-CM | POA: Diagnosis present

## 2024-10-28 DIAGNOSIS — W19XXXA Unspecified fall, initial encounter: Secondary | ICD-10-CM | POA: Insufficient documentation

## 2024-10-28 DIAGNOSIS — E669 Obesity, unspecified: Secondary | ICD-10-CM | POA: Diagnosis not present

## 2024-10-28 DIAGNOSIS — J4520 Mild intermittent asthma, uncomplicated: Secondary | ICD-10-CM | POA: Diagnosis not present

## 2024-10-28 DIAGNOSIS — F0390 Unspecified dementia without behavioral disturbance: Secondary | ICD-10-CM | POA: Diagnosis not present

## 2024-10-28 DIAGNOSIS — E1122 Type 2 diabetes mellitus with diabetic chronic kidney disease: Secondary | ICD-10-CM | POA: Diagnosis not present

## 2024-10-28 DIAGNOSIS — S2242XA Multiple fractures of ribs, left side, initial encounter for closed fracture: Principal | ICD-10-CM | POA: Insufficient documentation

## 2024-10-28 DIAGNOSIS — I129 Hypertensive chronic kidney disease with stage 1 through stage 4 chronic kidney disease, or unspecified chronic kidney disease: Secondary | ICD-10-CM | POA: Insufficient documentation

## 2024-10-28 DIAGNOSIS — E1129 Type 2 diabetes mellitus with other diabetic kidney complication: Secondary | ICD-10-CM | POA: Diagnosis present

## 2024-10-28 DIAGNOSIS — Z6832 Body mass index (BMI) 32.0-32.9, adult: Secondary | ICD-10-CM | POA: Diagnosis not present

## 2024-10-28 DIAGNOSIS — I700 Atherosclerosis of aorta: Secondary | ICD-10-CM | POA: Diagnosis not present

## 2024-10-28 DIAGNOSIS — S2232XA Fracture of one rib, left side, initial encounter for closed fracture: Secondary | ICD-10-CM | POA: Diagnosis present

## 2024-10-28 DIAGNOSIS — Y92009 Unspecified place in unspecified non-institutional (private) residence as the place of occurrence of the external cause: Secondary | ICD-10-CM

## 2024-10-28 DIAGNOSIS — M25552 Pain in left hip: Principal | ICD-10-CM

## 2024-10-28 LAB — URINALYSIS, ROUTINE W REFLEX MICROSCOPIC
Bilirubin Urine: NEGATIVE
Glucose, UA: NEGATIVE mg/dL
Hgb urine dipstick: NEGATIVE
Ketones, ur: NEGATIVE mg/dL
Leukocytes,Ua: NEGATIVE
Nitrite: NEGATIVE
Protein, ur: NEGATIVE mg/dL
Specific Gravity, Urine: 1.011 (ref 1.005–1.030)
pH: 5 (ref 5.0–8.0)

## 2024-10-28 LAB — BLOOD GAS, VENOUS
Acid-Base Excess: 6.5 mmol/L — ABNORMAL HIGH (ref 0.0–2.0)
Bicarbonate: 34.5 mmol/L — ABNORMAL HIGH (ref 20.0–28.0)
O2 Saturation: 53.2 %
Patient temperature: 37
pCO2, Ven: 64 mmHg — ABNORMAL HIGH (ref 44–60)
pH, Ven: 7.34 (ref 7.25–7.43)
pO2, Ven: 33 mmHg (ref 32–45)

## 2024-10-28 LAB — CBC WITH DIFFERENTIAL/PLATELET
Abs Immature Granulocytes: 0.01 K/uL (ref 0.00–0.07)
Basophils Absolute: 0 K/uL (ref 0.0–0.1)
Basophils Relative: 0 %
Eosinophils Absolute: 0.1 K/uL (ref 0.0–0.5)
Eosinophils Relative: 3 %
HCT: 36.6 % (ref 36.0–46.0)
Hemoglobin: 12 g/dL (ref 12.0–15.0)
Immature Granulocytes: 0 %
Lymphocytes Relative: 45 %
Lymphs Abs: 2.3 K/uL (ref 0.7–4.0)
MCH: 29.8 pg (ref 26.0–34.0)
MCHC: 32.8 g/dL (ref 30.0–36.0)
MCV: 90.8 fL (ref 80.0–100.0)
Monocytes Absolute: 0.4 K/uL (ref 0.1–1.0)
Monocytes Relative: 8 %
Neutro Abs: 2.2 K/uL (ref 1.7–7.7)
Neutrophils Relative %: 44 %
Platelets: 132 K/uL — ABNORMAL LOW (ref 150–400)
RBC: 4.03 MIL/uL (ref 3.87–5.11)
RDW: 12.3 % (ref 11.5–15.5)
WBC: 5 K/uL (ref 4.0–10.5)
nRBC: 0 % (ref 0.0–0.2)

## 2024-10-28 LAB — COMPREHENSIVE METABOLIC PANEL WITH GFR
ALT: 16 U/L (ref 0–44)
AST: 17 U/L (ref 15–41)
Albumin: 3.8 g/dL (ref 3.5–5.0)
Alkaline Phosphatase: 51 U/L (ref 38–126)
Anion gap: 10 (ref 5–15)
BUN: 44 mg/dL — ABNORMAL HIGH (ref 8–23)
CO2: 30 mmol/L (ref 22–32)
Calcium: 8.6 mg/dL — ABNORMAL LOW (ref 8.9–10.3)
Chloride: 100 mmol/L (ref 98–111)
Creatinine, Ser: 1.61 mg/dL — ABNORMAL HIGH (ref 0.44–1.00)
GFR, Estimated: 30 mL/min — ABNORMAL LOW (ref >60)
Glucose, Bld: 113 mg/dL — ABNORMAL HIGH (ref 70–99)
Potassium: 4.7 mmol/L (ref 3.5–5.1)
Sodium: 140 mmol/L (ref 135–145)
Total Bilirubin: 0.7 mg/dL (ref 0.0–1.2)
Total Protein: 6.3 g/dL — ABNORMAL LOW (ref 6.5–8.1)

## 2024-10-28 LAB — TROPONIN I (HIGH SENSITIVITY)
Troponin I (High Sensitivity): 9 ng/L (ref <18)
Troponin I (High Sensitivity): 9 ng/L (ref <18)

## 2024-10-28 LAB — MAGNESIUM: Magnesium: 2.4 mg/dL (ref 1.7–2.4)

## 2024-10-28 MED ORDER — SODIUM CHLORIDE 0.9 % IV BOLUS
500.0000 mL | Freq: Once | INTRAVENOUS | Status: AC
  Administered 2024-10-28: 500 mL via INTRAVENOUS

## 2024-10-28 MED ORDER — MELATONIN 5 MG PO TABS
5.0000 mg | ORAL_TABLET | Freq: Every evening | ORAL | Status: DC | PRN
  Administered 2024-10-29 – 2024-10-30 (×2): 5 mg via ORAL
  Filled 2024-10-28 (×2): qty 1

## 2024-10-28 MED ORDER — SODIUM CHLORIDE 0.9 % IV SOLN: INTRAVENOUS | Status: DC

## 2024-10-28 MED ORDER — DONEPEZIL HCL 5 MG PO TABS
5.0000 mg | ORAL_TABLET | Freq: Every day | ORAL | Status: DC
  Administered 2024-10-28 – 2024-10-30 (×3): 5 mg via ORAL
  Filled 2024-10-28 (×3): qty 1

## 2024-10-28 MED ORDER — AMLODIPINE BESYLATE 5 MG PO TABS
5.0000 mg | ORAL_TABLET | Freq: Every day | ORAL | Status: DC
  Administered 2024-10-28 – 2024-10-29 (×2): 5 mg via ORAL
  Filled 2024-10-28 (×2): qty 1

## 2024-10-28 MED ORDER — SODIUM CHLORIDE 0.9 % IV SOLN: INTRAVENOUS | Status: AC

## 2024-10-28 MED ORDER — DM-GUAIFENESIN ER 30-600 MG PO TB12: 1.0000 | ORAL_TABLET | Freq: Two times a day (BID) | ORAL | Status: DC | PRN

## 2024-10-28 MED ORDER — MORPHINE SULFATE (PF) 2 MG/ML IV SOLN
2.0000 mg | Freq: Once | INTRAVENOUS | Status: AC
  Administered 2024-10-28: 2 mg via INTRAVENOUS
  Filled 2024-10-28: qty 1

## 2024-10-28 MED ORDER — VITAMIN B-12 1000 MCG PO TABS
500.0000 ug | ORAL_TABLET | Freq: Every day | ORAL | Status: DC
  Administered 2024-10-29: 500 ug via ORAL
  Filled 2024-10-28 (×2): qty 1

## 2024-10-28 MED ORDER — ACETAMINOPHEN 325 MG PO TABS
650.0000 mg | ORAL_TABLET | Freq: Four times a day (QID) | ORAL | Status: DC | PRN
  Administered 2024-10-30: 650 mg via ORAL
  Filled 2024-10-28: qty 2

## 2024-10-28 MED ORDER — ENOXAPARIN SODIUM 30 MG/0.3ML IJ SOSY
30.0000 mg | PREFILLED_SYRINGE | INTRAMUSCULAR | Status: DC
  Administered 2024-10-28 – 2024-10-30 (×3): 30 mg via SUBCUTANEOUS
  Filled 2024-10-28 (×3): qty 0.3

## 2024-10-28 MED ORDER — OXYCODONE-ACETAMINOPHEN 5-325 MG PO TABS
1.0000 | ORAL_TABLET | ORAL | Status: DC | PRN
  Administered 2024-10-28 – 2024-10-30 (×7): 1 via ORAL
  Filled 2024-10-28 (×7): qty 1

## 2024-10-28 MED ORDER — GABAPENTIN 400 MG PO CAPS
400.0000 mg | ORAL_CAPSULE | Freq: Two times a day (BID) | ORAL | Status: DC
  Administered 2024-10-28 – 2024-10-29 (×3): 400 mg via ORAL
  Filled 2024-10-28 (×4): qty 1

## 2024-10-28 MED ORDER — LIDOCAINE 5 % EX PTCH
1.0000 | MEDICATED_PATCH | CUTANEOUS | Status: DC
  Administered 2024-10-28 – 2024-10-30 (×3): 1 via TRANSDERMAL
  Filled 2024-10-28 (×3): qty 1

## 2024-10-28 MED ORDER — METHOCARBAMOL 500 MG PO TABS: 500.0000 mg | ORAL_TABLET | Freq: Three times a day (TID) | ORAL | Status: DC | PRN

## 2024-10-28 MED ORDER — OMEPRAZOLE MAGNESIUM 20 MG PO TBEC: 20.0000 mg | DELAYED_RELEASE_TABLET | Freq: Every day | ORAL | Status: DC

## 2024-10-28 MED ORDER — HYDRALAZINE HCL 20 MG/ML IJ SOLN: 5.0000 mg | INTRAMUSCULAR | Status: DC | PRN

## 2024-10-28 MED ORDER — ALBUTEROL SULFATE (2.5 MG/3ML) 0.083% IN NEBU: 2.5000 mg | INHALATION_SOLUTION | RESPIRATORY_TRACT | Status: DC | PRN

## 2024-10-28 MED ORDER — PANTOPRAZOLE SODIUM 40 MG PO TBEC
40.0000 mg | DELAYED_RELEASE_TABLET | Freq: Every day | ORAL | Status: DC
  Administered 2024-10-29: 40 mg via ORAL
  Filled 2024-10-28 (×2): qty 1

## 2024-10-28 MED ORDER — ASPIRIN 81 MG PO CHEW
81.0000 mg | CHEWABLE_TABLET | Freq: Every day | ORAL | Status: DC
  Administered 2024-10-29: 81 mg via ORAL
  Filled 2024-10-28 (×2): qty 1

## 2024-10-28 MED ORDER — ALBUTEROL SULFATE HFA 108 (90 BASE) MCG/ACT IN AERS: 2.0000 | INHALATION_SPRAY | RESPIRATORY_TRACT | Status: DC | PRN

## 2024-10-28 MED ORDER — ONDANSETRON HCL 4 MG/2ML IJ SOLN: 4.0000 mg | Freq: Three times a day (TID) | INTRAMUSCULAR | Status: DC | PRN

## 2024-10-28 MED ORDER — ADULT MULTIVITAMIN W/MINERALS CH
1.0000 | ORAL_TABLET | Freq: Every day | ORAL | Status: DC
  Administered 2024-10-29: 1 via ORAL
  Filled 2024-10-28 (×2): qty 1

## 2024-10-28 NOTE — ED Notes (Signed)
 Patient transported to CT

## 2024-10-28 NOTE — ED Provider Notes (Signed)
 [facility] Provider Note    Event Date/Time   First MD Initiated Contact with Patient 10/28/24 1351     (approximate)   History   Fall   HPI  Charlissa Petros is a 89 year old female presenting to the emergency department for evaluation after a fall.  Patient reports that she was walking around went to grab her robe when she lost her balance and fell onto her left hip.  Denies head injury.  Reports pain over her left leg, denies injury elsewhere.  There was concerns for possible shortening with EMS.     Physical Exam   Triage Vital Signs: ED Triage Vitals  Encounter Vitals Group     BP 10/28/24 1357 (!) 140/56     Girls Systolic BP Percentile --      Girls Diastolic BP Percentile --      Boys Systolic BP Percentile --      Boys Diastolic BP Percentile --      Pulse Rate 10/28/24 1357 84     Resp 10/28/24 1357 16     Temp 10/28/24 1357 (!) 97.5 F (36.4 C)     Temp Source 10/28/24 1357 Oral     SpO2 10/28/24 1357 95 %     Weight 10/28/24 1358 156 lb (70.8 kg)     Height 10/28/24 1358 4' 10 (1.473 m)     Head Circumference --      Peak Flow --      Pain Score 10/28/24 1533 Asleep     Pain Loc --      Pain Education --      Exclude from Growth Chart --     Most recent vital signs: Vitals:   10/28/24 1500 10/28/24 1530  BP: (!) 140/60 (!) 111/59  Pulse: 74 74  Resp: 15 10  Temp:    SpO2: 96% 90%     General: Awake, interactive  Head:  Atraumatic Back:  No midline pain CV:  Good peripheral perfusion Resp:  Unlabored respirations, chest wall nontender to palpation Abd:  Nondistended, soft, nontender Neuro:  Symmetric facial movement, fluid speech Other:   Freely ranging bilateral upper extremities, able to actively range her left hip, though she does report some pain with this.  Intact distal pulses.   ED Results / Procedures / Treatments   Labs (all labs ordered are listed, but only abnormal results are displayed) Labs  Reviewed - No data to display   EKG EKG independently reviewed and interpreted by myself demonstrates:    RADIOLOGY Imaging independently reviewed and interpreted by myself demonstrates:  Hip x-Kani Jobson without acute fracture  Formal Radiology Read:  DG Hip Unilat W or Wo Pelvis 2-3 Views Left Result Date: 10/28/2024 CLINICAL DATA:  Clemens onto left hip, pain EXAM: DG HIP (WITH OR WITHOUT PELVIS) 2-3V LEFT COMPARISON:  10/06/2020 FINDINGS: Frontal view of the pelvis as well as frontal and cross-table lateral views of the left hip are obtained. No acute fracture, subluxation, or dislocation. Mild symmetrical bilateral hip osteoarthritis. Visualized portions of the sacroiliac joints are unremarkable. Oral contrast within the colon obscures portions of the lower lumbar spine and iliac crests. Stable postsurgical changes from lower lumbar fusion. IMPRESSION: 1. No acute displaced fracture. 2. Symmetrical bilateral hip osteoarthritis. Electronically Signed   By: Ozell Daring M.D.   On: 10/28/2024 14:46    PROCEDURES:  Critical Care performed: No  Procedures   MEDICATIONS ORDERED IN ED: Medications  morphine  (PF) 2 MG/ML injection 2  mg (2 mg Intravenous Given 10/28/24 1505)     IMPRESSION / MDM / ASSESSMENT AND PLAN / ED COURSE  I reviewed the triage vital signs and the nursing notes.  Differential diagnosis includes, but is not limited to, fracture, dislocation, soft tissue injury  Patient's presentation is most consistent with acute presentation with potential threat to life or bodily function.  89 year old female presenting to the emergency department for evaluation after a fall.  Stable vitals on presentation.  Isolated hip tenderness on exam.  X-Aarthi Uyeno ordered without acute abnormality.  Patient ordered for pain medicine with 2 mg of morphine .  Will attempt to ambulate patient after pain medication.  If she is able to ambulate with steady gait, suspect she will be stable for discharge.  If  she has significant difficulty with walking, may require further imaging for evaluation.  Signed out oncoming physician at 1530 pending reassessment after ambulation trial.      FINAL CLINICAL IMPRESSION(S) / ED DIAGNOSES   Final diagnoses:  Left hip pain  Fall in home, initial encounter     Rx / DC Orders   ED Discharge Orders     None        Note:  This document was prepared using Dragon voice recognition software and may include unintentional dictation errors.   Levander Slate, MD 10/28/24 [phone]

## 2024-10-28 NOTE — ED Provider Notes (Signed)
 4:53 PM Assumed care for off going team.   Blood pressure (!) 111/59, pulse 74, temperature (!) 97.5 F (36.4 C), temperature source Oral, resp. rate 10, height 4' 10 (1.473 m), weight 70.8 kg, SpO2 90%.  See their HPI for full report but in brief pending xray  Patient's plan was to do ambulation trial and then to hopefully go home.  2 hours after morphine  patient is still very sleepy repeatedly falling asleep and not safe to ambulate.  Not sure if this is related to the morphine .  Will get blood work, EKG, VBG to ensure no other cause of her sleepiness.   VBG with some slight elevation of CO2 but looks more chronic with elevated bicarb.  CBC reassuring CMP does show AKI.  Troponin is negative.  Patient had a episode in the monitor that looks like potentially polymorphic V. tach although could be artifact.  She was laying in the bed without any movement per the nurse.  Did add on magnesium  that was normal.  CT imaging concerning for rib fractures.  Patient is not hypoxic but given patient's age, AKI, possible arrhythmia will discuss hospital team with admission for cardiac monitoring.  Discussed with patient and family we will proceed with admission.      Ernest Ronal BRAVO, MD 10/28/24 [phone]

## 2024-10-28 NOTE — ED Triage Notes (Signed)
 Pt to ER via EMS from [place], stood up lsot her balance and fell onto left hip.  Noted shortening, pelvic binder applied. Pt is at baseline mentation with hx of dementia.

## 2024-10-28 NOTE — ED Notes (Signed)
Patient returned to room from imaging.

## 2024-10-28 NOTE — H&P (Signed)
 History and Physical    Kelsey Haynes FMW:[id number] DOB: 1932/11/03 DOA: 10/28/2024  Referring MD/NP/PA:   PCP: Lenon Layman ORN, MD   Patient coming from:  The patient is coming from SNF   Chief Complaint: fall, left rib cage pain  HPI: Kelsey Haynes is a 89 y.o. female with medical history significant of dementia, diet-controlled DM, HTN, asthma, GERD, CKD 3A, obesity, right breast cancer 2006 (s/p radiation therapy), who presents with fall and left rib cage pain.  Per her husband at the bedside, she accidentally fell when she was walking around, went to grab her robe and lost her balance.  She fell onto her left hip, but denies hip pain.  No head injury.  No headache or neck pain.  She complains of left rib cage pain, which is constant, sharp, severe, nonradiating, aggravated by movement.  Patient does not have cough, SOB.  No nausea, vomiting, diarrhea or abdominal pain.  No symptoms UTI.  No fever or chills.  Patient's mental status is at baseline.  Per ED physician, patient received 2 mg of morphine  and becomes sleepy. When I saw pt in ED, she is alert, oriented x 3.  She answered all questions appropriately.  No respiratory distress.  Per ED physician, telemetry monitor showed 1 episode of concerning pattern, looks like potentially polymorphic V. Tach vs. artifact. The EKG showed sinus rhythm with bifascicular block.  When I saw patient in ED, patient has regular sinus rhythm with on telemetry monitor.  Possibly had artificial effects earlier.  Data reviewed independently and ED Course: pt was found to have WBC 5.0, worsening renal function, negative UA, troponin 9- > 9, magnesium  2.4, potassium 4.7.  Temperature normal, blood pressure 130/57, heart rate 84, RR 12, oxygen saturation 90-100% on room air.  CT of head negative acute intracranial abnormalities.  CT of C-spine negative for acute injury, did show the degenerative disc disease.  X-ray of left hip/pelvis negative for bony  fracture.  CT of abdomen/pelvis showed left posterior 11th and 12th rib fracture.  Patient is present antibiotic outpatient.   CT of abdomen/pelvis: 1. Minimally displaced left posterior eleventh and twelfth rib fractures. 2. Otherwise unremarkable unenhanced CT of the abdomen and pelvis. 3.  Aortic Atherosclerosis (ICD10-I70.0).   EKG: I have personally reviewed.  Sinus rhythm, QTc 481, bifascicular block.   Review of Systems:   General: no fevers, chills, no body weight gain, has fatigue HEENT: no blurry vision, hearing changes or sore throat Respiratory: no dyspnea, coughing, wheezing CV: no chest pain, no palpitations GI: no nausea, vomiting, abdominal pain, diarrhea, constipation GU: no dysuria, burning on urination, increased urinary frequency, hematuria  Ext: has trace leg edema Neuro: no unilateral weakness, numbness, or tingling, no vision change or hearing loss.  Has fall. Skin: no rash, no skin tear. MSK: Has left rib cage pain Heme: No easy bruising.  Travel history: No recent long distant travel.   Allergy:  Allergies  Allergen Reactions   Other Swelling and Other (See Comments)    Hazel nuts    Past Medical History:  Diagnosis Date   Asthma    Breast cancer (HCC) right breast   Cancer (HCC) 2006   breast cancer 2006   Chronic kidney disease    Depression    Diabetes mellitus without complication (HCC)    Dysphagia    Falls    Hypertension    Personal history of radiation therapy    PONV (postoperative nausea and vomiting)  Past Surgical History:  Procedure Laterality Date   ABDOMINAL HYSTERECTOMY     BREAST BIOPSY Right 2006   +   BREAST CYST ASPIRATION Right    CATARACT EXTRACTION W/PHACO Left 11/14/2019   Procedure: CATARACT EXTRACTION PHACO AND INTRAOCULAR LENS PLACEMENT (IOC) LEFT DIABETIC;  Surgeon: Jaye Fallow, MD;  Location: [facility];  Service: Ophthalmology;  Laterality: Left;  US  1:18 CDE 14.93   CATARACT EXTRACTION  W/PHACO Right 12/05/2019   Procedure: CATARACT EXTRACTION PHACO AND INTRAOCULAR LENS PLACEMENT (IOC) RIGHT DIABETIC 7.58, 00:52.3;  Surgeon: Jaye Fallow, MD;  Location: [facility];  Service: Ophthalmology;  Laterality: Right;   endoscopic carpal tunnel release     ESOPHAGOGASTRODUODENOSCOPY N/A 12/17/2022   Procedure: ESOPHAGOGASTRODUODENOSCOPY (EGD);  Surgeon: Onita Elspeth Sharper, DO;  Location: [facility];  Service: Gastroenterology;  Laterality: N/A;   ESOPHAGOGASTRODUODENOSCOPY (EGD) WITH PROPOFOL  N/A 12/04/2022   Procedure: ESOPHAGOGASTRODUODENOSCOPY (EGD) WITH PROPOFOL ;  Surgeon: Onita Elspeth Sharper, DO;  Location: [facility];  Service: Gastroenterology;  Laterality: N/A;  please make 1st case of morning   HALLUX VALGUS CORRECTION Left 1983   HERNIA REPAIR     x2   left foot surgery Left    LEFT HEART CATH AND CORONARY ANGIOGRAPHY Left 07/06/2019   Procedure: LEFT HEART CATH AND CORONARY ANGIOGRAPHY;  Surgeon: Florencio Cara BIRCH, MD;  Location: [facility];  Service: Cardiovascular;  Laterality: Left;   SHOULDER SURGERY Bilateral    SPINE SURGERY     back    Social History:  reports that she quit smoking about 67 years ago. Her smoking use included cigarettes. She has never used smokeless tobacco. She reports that she does not currently use alcohol. She reports that she does not currently use drugs.  Family History:  Family History  Problem Relation Age of Onset   Breast cancer Neg Hx      Prior to Admission medications   Medication Sig Start Date End Date Taking? Authorizing Provider  acetaminophen  (TYLENOL ) 325 MG tablet Take 650 mg by mouth every 6 (six) hours as needed for moderate pain or headache.     [provider]  amLODipine  (NORVASC ) 10 MG tablet Take 1 tablet (10 mg total) by mouth daily. 10/11/20   Jens Durand, MD  amoxicillin (AMOXIL) 875 MG tablet Take 875 mg by mouth 2 (two) times daily.    [provider]   aspirin  81 MG chewable tablet Chew 81 mg by mouth daily.    [provider]  cyanocobalamin (VITAMIN B12) 500 MCG tablet Take 500 mcg by mouth daily.    [provider]  diclofenac Sodium (VOLTAREN ARTHRITIS PAIN) 1 % GEL Apply topically 4 (four) times daily.    [provider]  donepezil (ARICEPT) 5 MG tablet Take 5 mg by mouth at bedtime.    [provider]  gabapentin  (NEURONTIN ) 100 MG capsule Take 400 mg by mouth 2 (two) times daily. 10/02/20   [provider]  gabapentin  (NEURONTIN ) 400 MG capsule Take 800 mg by mouth at bedtime.    [provider]  ibuprofen  (ADVIL ) 200 MG tablet Take 200 mg by mouth every 6 (six) hours as needed.    [provider]  losartan  (COZAAR ) 50 MG tablet Take 1 tablet (50 mg total) by mouth daily. 04/21/24 04/21/25  Bradler, Evan K, MD  melatonin 3 MG TABS tablet Take 5 mg by mouth at bedtime. 04/16/20   [provider]  Multiple Vitamin (MULTI-VITAMINS) TABS Take 1 tablet by  mouth daily.     [provider]  omeprazole (PRILOSEC OTC) 20 MG tablet Take 1 tablet (20 mg total) by mouth daily. 09/25/24 10/25/24  Clarine Ozell LABOR, MD  omeprazole (PRILOSEC) 20 MG capsule Take 20 mg by mouth every morning. 09/30/20   [provider]    Physical Exam: Vitals:   10/28/24 1800 10/28/24 1830 10/28/24 2129 10/28/24 2132  BP: (!) 110/50 (!) 134/57 (!) 147/65 (!) 147/65  Pulse: 73 76 87 87  Resp: 10 12 18 17   Temp:   98.2 F (36.8 C) 98.2 F (36.8 C)  TempSrc:    Oral  SpO2: 93% 93% 93% 93%  Weight:      Height:       General: Not in acute distress HEENT:       Eyes: PERRL, EOMI, no jaundice       ENT: No discharge from the ears and nose, no pharynx injection, no tonsillar enlargement.        Neck: No JVD, no bruit, no mass felt. Heme: No neck lymph node enlargement. Cardiac: S1/S2, RRR, No murmurs, No gallops or rubs. Respiratory: No rales, wheezing, rhonchi or rubs. GI:  Soft, nondistended, nontender, no rebound pain, no organomegaly, BS present. GU: No hematuria Ext: has trace leg edema bilaterally. 1+DP/PT pulse bilaterally. Musculoskeletal: has tenderness to left rib cage and left back Skin: No rashes.  Neuro: Alert, oriented X3, cranial nerves II-XII grossly intact, moves all extremities  Psych: Patient is not psychotic, no suicidal or hemocidal ideation.  Labs on Admission: I have personally reviewed following labs and imaging studies  CBC: Recent Labs  Lab 10/28/24 1720  WBC 5.0  NEUTROABS 2.2  HGB 12.0  HCT 36.6  MCV 90.8  PLT 132*   Basic Metabolic Panel: Recent Labs  Lab 10/28/24 1720  NA 140  K 4.7  CL 100  CO2 30  GLUCOSE 113*  BUN 44*  CREATININE 1.61*  CALCIUM 8.6*  MG 2.4   GFR: Estimated Creatinine Clearance: 19.4 mL/min (A) (by C-G formula based on SCr of 1.61 mg/dL (H)). Liver Function Tests: Recent Labs  Lab 10/28/24 1720  AST 17  ALT 16  ALKPHOS 51  BILITOT 0.7  PROT 6.3*  ALBUMIN 3.8   No results for input(s): LIPASE, AMYLASE in the last 168 hours. No results for input(s): AMMONIA in the last 168 hours. Coagulation Profile: No results for input(s): INR, PROTIME in the last 168 hours. Cardiac Enzymes: No results for input(s): CKTOTAL, CKMB, CKMBINDEX, TROPONINI in the last 168 hours. BNP (last 3 results) No results for input(s): PROBNP in the last 8760 hours. HbA1C: No results for input(s): HGBA1C in the last 72 hours. CBG: No results for input(s): GLUCAP in the last 168 hours. Lipid Profile: No results for input(s): CHOL, HDL, LDLCALC, TRIG, CHOLHDL, LDLDIRECT in the last 72 hours. Thyroid Function Tests: No results for input(s): TSH, T4TOTAL, FREET4, T3FREE, THYROIDAB in the last 72 hours. Anemia Panel: No results for input(s): VITAMINB12, FOLATE, FERRITIN, TIBC, IRON, RETICCTPCT in the last 72 hours. Urine analysis:    Component Value  Date/Time   COLORURINE STRAW (A) 10/28/2024 1720   APPEARANCEUR CLEAR (A) 10/28/2024 1720   LABSPEC 1.011 10/28/2024 1720   PHURINE 5.0 10/28/2024 1720   GLUCOSEU NEGATIVE 10/28/2024 1720   HGBUR NEGATIVE 10/28/2024 1720   BILIRUBINUR NEGATIVE 10/28/2024 1720   KETONESUR NEGATIVE 10/28/2024 1720   PROTEINUR NEGATIVE 10/28/2024 1720   NITRITE NEGATIVE 10/28/2024 1720   LEUKOCYTESUR NEGATIVE 10/28/2024 1720  Sepsis Labs: @LABRCNTIP (procalcitonin:4,lacticidven:4) )No results found for this or any previous visit (from the past 240 hours).   Radiological Exams on Admission:   Assessment/Plan Principal Problem:   Left rib fracture Active Problems:   Fall at home, initial encounter   Hypertension   Asthma   Acute renal failure superimposed on stage 3a chronic kidney disease (HCC)   Type II diabetes mellitus with renal manifestations (HCC)   Dementia without behavioral disturbance (HCC)   Obesity (BMI 30-39.9)   Assessment and Plan:  Principal Problem:   Left rib fracture Active Problems:   Fall at home, initial encounter   Hypertension   Asthma   Acute renal failure superimposed on stage 3a chronic kidney disease (HCC)   Type II diabetes mellitus with renal manifestations (HCC)   Dementia without behavioral disturbance (HCC)   Obesity (BMI 30-39.9)      DVT ppx:  SQ Lovenox   Code Status:  DNR  Family Communication:   yes, patient's husband at bed side.       Disposition Plan:  Anticipate discharge back to previous environment  Consults called: None  Admission status and Level of care: Telemetry:    for obs     Dispo: The patient is from: SNF              Anticipated d/c is to: SNF              Anticipated d/c date is: 1 day              Patient currently is not medically stable to d/c.    Severity of Illness:  The appropriate patient status for this patient is OBSERVATION. Observation status is judged to be reasonable and necessary in order to  provide the required intensity of service to ensure the patient's safety. The patient's presenting symptoms, physical exam findings, and initial radiographic and laboratory data in the context of their medical condition is felt to place them at decreased risk for further clinical deterioration. Furthermore, it is anticipated that the patient will be medically stable for discharge from the hospital within 2 midnights of admission.        Date of Service 10/28/2024    Caleb Exon [facility]   If 7PM-7AM, please contact night-coverage [email or web address] 10/28/2024, 11:28 PM

## 2024-10-28 NOTE — ED Notes (Signed)
 This RN and NT to bedside. Incontinence brief changed.

## 2024-10-29 ENCOUNTER — Observation Stay

## 2024-10-29 DIAGNOSIS — M25552 Pain in left hip: Secondary | ICD-10-CM | POA: Diagnosis not present

## 2024-10-29 DIAGNOSIS — E1122 Type 2 diabetes mellitus with diabetic chronic kidney disease: Secondary | ICD-10-CM

## 2024-10-29 DIAGNOSIS — I10 Essential (primary) hypertension: Secondary | ICD-10-CM

## 2024-10-29 DIAGNOSIS — S2242XA Multiple fractures of ribs, left side, initial encounter for closed fracture: Secondary | ICD-10-CM | POA: Diagnosis not present

## 2024-10-29 DIAGNOSIS — W19XXXA Unspecified fall, initial encounter: Secondary | ICD-10-CM | POA: Diagnosis not present

## 2024-10-29 LAB — BASIC METABOLIC PANEL WITH GFR
Anion gap: 9 (ref 5–15)
BUN: 37 mg/dL — ABNORMAL HIGH (ref 8–23)
CO2: 28 mmol/L (ref 22–32)
Calcium: 8.4 mg/dL — ABNORMAL LOW (ref 8.9–10.3)
Chloride: 104 mmol/L (ref 98–111)
Creatinine, Ser: 1.23 mg/dL — ABNORMAL HIGH (ref 0.44–1.00)
GFR, Estimated: 42 mL/min — ABNORMAL LOW (ref >60)
Glucose, Bld: 95 mg/dL (ref 70–99)
Potassium: 4.4 mmol/L (ref 3.5–5.1)
Sodium: 141 mmol/L (ref 135–145)

## 2024-10-29 LAB — CBC
HCT: 35.8 % — ABNORMAL LOW (ref 36.0–46.0)
Hemoglobin: 11.3 g/dL — ABNORMAL LOW (ref 12.0–15.0)
MCH: 29.5 pg (ref 26.0–34.0)
MCHC: 31.6 g/dL (ref 30.0–36.0)
MCV: 93.5 fL (ref 80.0–100.0)
Platelets: 136 K/uL — ABNORMAL LOW (ref 150–400)
RBC: 3.83 MIL/uL — ABNORMAL LOW (ref 3.87–5.11)
RDW: 12.5 % (ref 11.5–15.5)
WBC: 5.2 K/uL (ref 4.0–10.5)
nRBC: 0 % (ref 0.0–0.2)

## 2024-10-29 LAB — GLUCOSE, CAPILLARY: Glucose-Capillary: 104 mg/dL — ABNORMAL HIGH (ref 70–99)

## 2024-10-29 MED ORDER — LACTATED RINGERS IV SOLN: INTRAVENOUS | Status: AC

## 2024-10-29 NOTE — Evaluation (Signed)
 Occupational Therapy Evaluation Patient Details Name: Kelsey Haynes MRN: [id number] DOB: 12/19/1932 Today's Date: 10/29/2024   History of Present Illness   Pt is a 89 y/o F admitted on 10/28/24 after presenting with c/o a fall & L rib pain. Pt found to have L posterior 11th & 12th rib fxs. PMH: dementia, DM, HTN, asthma, GERD, CKD3A, obesity, R breast CA s/p radiation     Clinical Impressions Chart reviewed to date, pt is oriented to self and place, reports she lives at [place]. Pt is a ?historian, will need to confirm. Pt reports she amb with RW, has assist with IADLs, will need to confirm ADL status. Pt presents with deficits in strength, endurance, activity tolerance, balance, cognition, affecting safe and optimal ADL completion. She requires MOD A +2 for STS with RW with B feet blocked, MOD A +2 for step pivot to bedside chair with frequent mult modal cues. MIN A required for seated grooming tasks, MAX A for LB dressing. Blinds opened for delirium prevention. Pt is left in bedside chair in care of MD, all needs met. OT will continue to follow to facilitate optimal ADL/functional mobility performance.      If plan is discharge home, recommend the following:   A lot of help with walking and/or transfers;A lot of help with bathing/dressing/bathroom;Supervision due to cognitive status     Functional Status Assessment   Patient has had a recent decline in their functional status and demonstrates the ability to make significant improvements in function in a reasonable and predictable amount of time.     Equipment Recommendations   Other (comment) (defer to next venue of care)     Recommendations for Other Services         Precautions/Restrictions   Precautions Precautions: Fall Recall of Precautions/Restrictions: Impaired Restrictions Weight Bearing Restrictions Per Provider Order: No     Mobility Bed Mobility               General bed mobility comments: pt  sitting on edge of bed with PT at start of OT evaluation    Transfers Overall transfer level: Needs assistance Equipment used: Rolling walker (2 wheels) Transfers: Sit to/from Stand Sit to Stand: Mod assist, +2 physical assistance, +2 safety/equipment, From elevated surface                  Balance Overall balance assessment: Needs assistance Sitting-balance support: Feet supported Sitting balance-Leahy Scale: Fair     Standing balance support: During functional activity, Bilateral upper extremity supported, Reliant on assistive device for balance Standing balance-Leahy Scale: Poor                             ADL either performed or assessed with clinical judgement   ADL Overall ADL's : Needs assistance/impaired     Grooming: Minimal assistance;Sitting;Cueing for sequencing               Lower Body Dressing: Maximal assistance   Toilet Transfer: Moderate assistance;Rolling walker (2 wheels);+2 for safety/equipment;+2 for physical assistance;Cueing for safety;Cueing for sequencing   Toileting- Clothing Manipulation and Hygiene: Maximal assistance Toileting - Clothing Manipulation Details (indicate cue type and reason): anticipate             Vision Patient Visual Report: No change from baseline Additional Comments: will continue to assess     Perception         Praxis  Pertinent Vitals/Pain Pain Assessment Pain Assessment: Faces Faces Pain Scale: Hurts even more Pain Location: LLE Pain Descriptors / Indicators: Discomfort, Grimacing, Guarding Pain Intervention(s): Repositioned, Monitored during session     Extremity/Trunk Assessment Upper Extremity Assessment Upper Extremity Assessment: Generalized weakness   Lower Extremity Assessment Lower Extremity Assessment: Generalized weakness       Communication Communication Communication: Impaired Factors Affecting Communication: Hearing impaired   Cognition Arousal:  Alert Behavior During Therapy: WFL for tasks assessed/performed, Flat affect Cognition: No family/caregiver present to determine baseline, Cognition impaired, History of cognitive impairments   Orientation impairments: Time, Situation Awareness: Online awareness impaired Memory impairment (select all impairments): Declarative long-term memory, Short-term memory Attention impairment (select first level of impairment): Sustained attention Executive functioning impairment (select all impairments): Sequencing, Reasoning, Problem solving                   Following commands: Impaired Following commands impaired: Only follows one step commands consistently, Follows one step commands with increased time     Cueing  General Comments   Cueing Techniques: Verbal cues;Tactile cues;Visual cues  vss on 2 L via [place];   Exercises Other Exercises Other Exercises: edu re role of OT, role of rehab, discharge recommendations   Shoulder Instructions      Home Living Family/patient expects to be discharged to:: Unsure                                 Additional Comments: per chart, pt is from [facility], will need to confirm      Prior Functioning/Environment Prior Level of Function : Patient poor historian/Family not available             Mobility Comments: pt reports she amb with RW ADLs Comments: pt reports assist for IADLs, will need to confirm ADL status    OT Problem List: Decreased strength;Decreased activity tolerance;Impaired balance (sitting and/or standing);Decreased safety awareness;Decreased cognition;Decreased knowledge of use of DME or AE   OT Treatment/Interventions: Self-care/ADL training;Balance training;Therapeutic exercise;Therapeutic activities;Energy conservation;DME and/or AE instruction;Cognitive remediation/compensation;Patient/family education      OT Goals(Current goals can be found in the care plan section)   Acute Rehab OT Goals Patient  Stated Goal: feel stronger OT Goal Formulation: With patient Time For Goal Achievement: 11/12/24 Potential to Achieve Goals: Good ADL Goals Pt Will Perform Grooming: with supervision;sitting Pt Will Perform Lower Body Dressing: with supervision;sit to/from stand;sitting/lateral leans Pt Will Transfer to Toilet: with supervision;ambulating Pt Will Perform Toileting - Clothing Manipulation and hygiene: with supervision;sitting/lateral leans;sit to/from stand   OT Frequency:  Min 2X/week    Co-evaluation PT/OT/SLP Co-Evaluation/Treatment: Yes Reason for Co-Treatment: For patient/therapist safety PT goals addressed during session: Mobility/safety with mobility;Balance;Proper use of DME OT goals addressed during session: ADL's and self-care      AM-PAC OT 6 Clicks Daily Activity     Outcome Measure Help from another person eating meals?: A Little Help from another person taking care of personal grooming?: A Little Help from another person toileting, which includes using toliet, bedpan, or urinal?: A Lot Help from another person bathing (including washing, rinsing, drying)?: A Lot Help from another person to put on and taking off regular upper body clothing?: A Little Help from another person to put on and taking off regular lower body clothing?: A Lot 6 Click Score: 15   End of Session Equipment Utilized During Treatment: Rolling walker (2 wheels);Oxygen  Activity Tolerance: Patient tolerated  treatment well Patient left: in chair;with call bell/phone within reach;with chair alarm set (Dr Caleen present)  OT Visit Diagnosis: Other abnormalities of gait and mobility (R26.89);Muscle weakness (generalized) (M62.81);Other symptoms and signs involving cognitive function                Time: 1045-1056 OT Time Calculation (min): 11 min Charges:  OT General Charges $OT Visit: 1 Visit OT Evaluation $OT Eval Moderate Complexity: 1 Mod Therisa Sheffield, OTD OTR/L  10/29/24, 1:24 PM

## 2024-10-29 NOTE — Assessment & Plan Note (Signed)
 No concern of exacerbation. -Continue with as needed bronchodilator

## 2024-10-29 NOTE — Progress Notes (Signed)
 The IV nurse rec'd a consult for new iv access; pt in the recliner at 1455;  will return when able to place a new IV.

## 2024-10-29 NOTE — Care Management Obs Status (Signed)
 MEDICARE OBSERVATION STATUS NOTIFICATION   Patient Details  Name: Kelsey Haynes MRN: [id number] Date of Birth: 1932/09/12   Medicare Observation Status Notification Given:  Yes patient could not physically sign    GERALDYN SHAIN 10/29/2024, 5:18 PM

## 2024-10-29 NOTE — Plan of Care (Signed)

## 2024-10-29 NOTE — Plan of Care (Signed)

## 2024-10-29 NOTE — Progress Notes (Signed)
 Progress Note   Patient: Kelsey Haynes FMW:[id number] DOB: [birth year] DOA: 10/28/2024     0 DOS: the patient was seen and examined on 10/29/2024   Brief hospital course: Taken from H&P.  Alishah Schulte is a 89 y.o. female with medical history significant of dementia, diet-controlled DM, HTN, asthma, GERD, CKD 3A, obesity, right breast cancer 2006 (s/p radiation therapy), who presents with mechanical fall and left rib cage pain.   She was found to have left posterior 11th and 12th rib fracture.  On presentation hemodynamically stable, labs with worsening renal function, negative UA.  CT head was negative for any acute abnormality.  CT of C-spine negative for acute injury, did show degenerative disc disease.  Left hip and pelvic imaging was negative for any bony fractures. CT abdomen and pelvis shows a left posterior 11th and 12th rib fracture.  Patient was admitted for pain control.  PT and OT ordered.  11/2: Vital stable, improvement of creatinine to 1.23, giving some more IV fluid. Pt is recommending SNF. Left ankle imaging was obtained as she was complaining of pain and it was negative for any bony injuries.  Assessment and Plan: * Left rib fracture pt has minimally displaced left posterior eleventh and twelfth rib fractures as shown by CT scan.  Pain currently seems bearable. -Continue with supportive care and pain management -Continue with incentive spirometry  Fall at home, initial encounter Likely resulted in rib fractures.  No other bony injuries. - PT is recommending SNF  Hypertension Blood pressure currently within goal. -Home Cozaar  was held due to concern of AKI -Continue with low-dose amlodipine  -As needed hydralazine  Acute renal failure superimposed on stage 3a chronic kidney disease (HCC) Some improvement of creatinine with IV fluid, still above baseline and at 1.26 -Continue with gentle IV fluid for another day -Monitor renal function -avoid  nephrotoxins  Asthma No concern of exacerbation. - Continue with as needed bronchodilator  Type II diabetes mellitus with renal manifestations (HCC) Seems well-controlled, recent A1c of 6.5.  Patient was not taking any medication at home. - Continue to monitor  Dementia without behavioral disturbance (HCC) - Continue with donepezil  Obesity (BMI 30-39.9) Patient has class I obesity with BMI of 32.6.   Subjective: Patient was sitting in chair when seen today.  Pain seems bearable.  She was complaining of pain in left ankle.  Physical Exam: Vitals:   10/28/24 2129 10/28/24 2132 10/29/24 0528 10/29/24 0810  BP: (!) 147/65 (!) 147/65 (!) 140/55 (!) 135/53  Pulse: 87 87 70 78  Resp: 18 17 15 17   Temp: 98.2 F (36.8 C) 98.2 F (36.8 C) 97.7 F (36.5 C) 97.8 F (36.6 C)  TempSrc:  Oral Oral (P) Oral  SpO2: 93% 93% 100% 100%  Weight:      Height:       General.  Obese and frail elderly lady, in no acute distress. Pulmonary.  Lungs clear bilaterally, normal respiratory effort. CV.  Regular rate and rhythm, no JVD, rub or murmur. Abdomen.  Soft, nontender, nondistended, BS positive. CNS.  Alert and oriented .  No focal neurologic deficit. Extremities.  No edema, no cyanosis, pulses intact and symmetrical.   Data Reviewed: Prior data reviewed  Family Communication: Tried calling daughter with no response.  Disposition: Status is: Observation The patient will require care spanning > 2 midnights and should be moved to inpatient because:   Planned Discharge Destination: Skilled nursing facility  DVT prophylaxis.  Lovenox  Time spent: 45 minutes  This  record has been created using Conservation officer, historic buildings. Errors have been sought and corrected,but may not always be located. Such creation errors do not reflect on the standard of care.   Author: Amaryllis Dare, MD 10/29/2024 2:36 PM  For on call review [email or web address].

## 2024-10-29 NOTE — Assessment & Plan Note (Signed)
 Seems well-controlled, recent A1c of 6.5.  Patient was not taking any medication at home. - Continue to monitor

## 2024-10-29 NOTE — Assessment & Plan Note (Signed)
 Likely resulted in rib fractures.  No other bony injuries. - PT is recommending SNF

## 2024-10-29 NOTE — Assessment & Plan Note (Signed)
 pt has minimally displaced left posterior eleventh and twelfth rib fractures as shown by CT scan.  Pain currently seems bearable. -Continue with supportive care and pain management -Continue with incentive spirometry

## 2024-10-29 NOTE — Assessment & Plan Note (Signed)
 Some improvement of creatinine with IV fluid, still above baseline and at 1.26 -Continue with gentle IV fluid for another day -Monitor renal function -avoid nephrotoxins

## 2024-10-29 NOTE — Assessment & Plan Note (Signed)
 Continue with donepezil.

## 2024-10-29 NOTE — Hospital Course (Addendum)
 Taken from H&P.  Kelsey Haynes is a 89 y.o. female with medical history significant of dementia, diet-controlled DM, HTN, asthma, GERD, CKD 3A, obesity, right breast cancer 2006 (s/p radiation therapy), who presents with mechanical fall and left rib cage pain.   She was found to have left posterior 11th and 12th rib fracture.  On presentation hemodynamically stable, labs with worsening renal function, negative UA.  CT head was negative for any acute abnormality.  CT of C-spine negative for acute injury, did show degenerative disc disease.  Left hip and pelvic imaging was negative for any bony fractures. CT abdomen and pelvis shows a left posterior 11th and 12th rib fracture.  Patient was admitted for pain control.  PT and OT ordered.  11/2: Vital stable, improvement of creatinine to 1.23, giving some more IV fluid. Pt is recommending SNF. Left ankle imaging was obtained as she was complaining of pain and it was negative for any bony injuries.  11/3: Remained hemodynamically stable, blood pressure mildly elevated.  Creatinine with further improvement to 1.1.  Home losartan  was restarted.  Patient was given opioid pain medication and muscle relaxant to be used as needed.  Skilled nursing facility staff should avoid oversedation.  While taking opioids she should be getting a regular bowel regimen to avoid constipation.  Please be vigilant with pain medications and try controlling pain with nonopioid before giving her opioids.    Patient is being discharged to rehab and from where she can go back to her memory care unit.  She will continue on current medications and need to have a close follow-up with her providers for further assistance.

## 2024-10-29 NOTE — Evaluation (Signed)
 Physical Therapy Evaluation Patient Details Name: Kelsey Haynes MRN: [id number] DOB: March 29, 1932 Today's Date: 10/29/2024  History of Present Illness  Pt is a 89 y/o F admitted on 10/28/24 after presenting with c/o a fall & L rib pain. Pt found to have L posterior 11th & 12th rib fxs. PMH: dementia, DM, HTN, asthma, GERD, CKD3A, obesity, R breast CA s/p radiation  Clinical Impression  Pt seen for PT evaluation with pt agreeable to tx. Pt reports prior to admission she was ambulatory with RW. On this date, pt requires max assist for bed mobility with hospital bed features, mod assist +2 for sit>stand & step pivot bed>recliner on R. Pt with c/o increased pain in LLE with movement/standing. Recommend ongoing PT treatment to progress mobility as able.         If plan is discharge home, recommend the following: Two people to help with walking and/or transfers;Two people to help with bathing/dressing/bathroom   Can travel by private vehicle   No    Equipment Recommendations Other (comment) (defer to next venue)  Recommendations for Other Services       Functional Status Assessment Patient has had a recent decline in their functional status and demonstrates the ability to make significant improvements in function in a reasonable and predictable amount of time.     Precautions / Restrictions Precautions Precautions: Fall Precaution/Restrictions Comments: L rib fxs Restrictions Weight Bearing Restrictions Per Provider Order: No      Mobility  Bed Mobility Overal bed mobility: Needs Assistance Bed Mobility: Supine to Sit     Supine to sit: Max assist, HOB elevated, Used rails (cuing & assistance to move BLE to EOB, upright trunk, exited R side of bed)          Transfers Overall transfer level: Needs assistance Equipment used: Rolling walker (2 wheels) Transfers: Sit to/from Stand, Bed to chair/wheelchair/BSC Sit to Stand: Mod assist, +2 physical assistance, +2 safety/equipment,  From elevated surface   Step pivot transfers: Mod assist, +2 physical assistance, +2 safety/equipment (bed>recliner on R with RW, decreased weight shift to LLE in stance phase 2/2 pain to allow increased stepping RLE)       General transfer comment: sit>stand x 2 attempts with pt successfully transferring to standing on 2nd with PT providing cuing/assistance for wide vs narrow BOS, assistance to power up to standing, cuing for increased hip extensor/glute activation for anterior pelvic shift    Ambulation/Gait                  Stairs            Wheelchair Mobility     Tilt Bed    Modified Rankin (Stroke Patients Only)       Balance Overall balance assessment: Needs assistance Sitting-balance support: Feet supported Sitting balance-Leahy Scale: Fair Sitting balance - Comments: close supervision static sitting EOB   Standing balance support: During functional activity, Bilateral upper extremity supported, Reliant on assistive device for balance Standing balance-Leahy Scale: Poor                               Pertinent Vitals/Pain Pain Assessment Pain Assessment: Faces Faces Pain Scale: Hurts even more Pain Location: LLE Pain Descriptors / Indicators: Discomfort, Grimacing, Guarding Pain Intervention(s): Monitored during session, Repositioned    Home Living Family/patient expects to be discharged to:: Unsure  Additional Comments: Pt is poor historian, reports she's not from home. No family/friends present. Per chart, pt from [facility].    Prior Function               Mobility Comments: Pt reports she was ambulatory with RW.       Extremity/Trunk Assessment   Upper Extremity Assessment Upper Extremity Assessment: Generalized weakness    Lower Extremity Assessment Lower Extremity Assessment: Generalized weakness (increased LLE weakness & pain (pt fell on L side))       Communication        Cognition  Arousal: Alert Behavior During Therapy: WFL for tasks assessed/performed   PT - Cognitive impairments: History of cognitive impairments                       PT - Cognition Comments: Per chart, pt with hx of dementia, AxOx4 with questioning cuing to recall place Following commands: Impaired Following commands impaired: Only follows one step commands consistently, Follows one step commands with increased time     Cueing Cueing Techniques: Verbal cues     General Comments General comments (skin integrity, edema, etc.): VSS on 2L/min via nasal cannula (one reading of 87% but quickly changed to 93%); pt placed on room air per MD instruction as MD entered room at end of session (nurse made aware)    Exercises     Assessment/Plan    PT Assessment Patient needs continued PT services  PT Problem List Decreased strength;Cardiopulmonary status limiting activity;Pain;Decreased range of motion;Decreased cognition;Decreased activity tolerance;Decreased knowledge of use of DME;Decreased balance;Decreased safety awareness;Decreased knowledge of precautions;Decreased mobility       PT Treatment Interventions DME instruction;Balance training;Gait training;Neuromuscular re-education;Functional mobility training;Patient/family education;Therapeutic activities;Therapeutic exercise;Manual techniques    PT Goals (Current goals can be found in the Care Plan section)  Acute Rehab PT Goals Patient Stated Goal: decreased pain PT Goal Formulation: With patient Time For Goal Achievement: 11/12/24 Potential to Achieve Goals: Good    Frequency Min 2X/week     Co-evaluation PT/OT/SLP Co-Evaluation/Treatment: Yes Reason for Co-Treatment: For patient/therapist safety PT goals addressed during session: Mobility/safety with mobility;Balance;Proper use of DME         AM-PAC PT 6 Clicks Mobility  Outcome Measure Help needed turning from your back to your side while in a flat bed without using  bedrails?: A Lot Help needed moving from lying on your back to sitting on the side of a flat bed without using bedrails?: Total Help needed moving to and from a bed to a chair (including a wheelchair)?: Total Help needed standing up from a chair using your arms (e.g., wheelchair or bedside chair)?: Total Help needed to walk in hospital room?: Total Help needed climbing 3-5 steps with a railing? : Total 6 Click Score: 7    End of Session   Activity Tolerance: Patient limited by pain Patient left: in chair;with chair alarm set;with call bell/phone within reach Nurse Communication: Mobility status (O2) PT Visit Diagnosis: Pain;Muscle weakness (generalized) (M62.81);Difficulty in walking, not elsewhere classified (R26.2);Unsteadiness on feet (R26.81) Pain - Right/Left: Left Pain - part of body: Leg    Time: 8973-8951 PT Time Calculation (min) (ACUTE ONLY): 22 min   Charges:   PT Evaluation $PT Eval Moderate Complexity: 1 Mod   PT General Charges $$ ACUTE PT VISIT: 1 Visit         Richerd Pinal, PT, DPT 10/29/24, 11:02 AM   Richerd CHRISTELLA Pinal 10/29/2024, 11:02 AM

## 2024-10-29 NOTE — Assessment & Plan Note (Addendum)
 Patient has class I obesity with BMI of 32.6.

## 2024-10-29 NOTE — Assessment & Plan Note (Signed)
 Blood pressure currently within goal. -Home Cozaar  was held due to concern of AKI -Continue with low-dose amlodipine  -As needed hydralazine

## 2024-10-29 NOTE — Progress Notes (Signed)
 RN notified the IV Nurse that the pt is back in bed for the IV restart;  upon entering the room, the pt told this writer I don't want it;  Her dinner was brought in at the same time; pt wants to eat now.  Will notify RN.

## 2024-10-30 DIAGNOSIS — N179 Acute kidney failure, unspecified: Secondary | ICD-10-CM | POA: Diagnosis not present

## 2024-10-30 DIAGNOSIS — I10 Essential (primary) hypertension: Secondary | ICD-10-CM | POA: Diagnosis not present

## 2024-10-30 DIAGNOSIS — S2242XA Multiple fractures of ribs, left side, initial encounter for closed fracture: Secondary | ICD-10-CM | POA: Diagnosis not present

## 2024-10-30 DIAGNOSIS — W19XXXA Unspecified fall, initial encounter: Secondary | ICD-10-CM | POA: Diagnosis not present

## 2024-10-30 LAB — BASIC METABOLIC PANEL WITH GFR
Anion gap: 10 (ref 5–15)
BUN: 34 mg/dL — ABNORMAL HIGH (ref 8–23)
CO2: 27 mmol/L (ref 22–32)
Calcium: 8.5 mg/dL — ABNORMAL LOW (ref 8.9–10.3)
Chloride: 102 mmol/L (ref 98–111)
Creatinine, Ser: 1.1 mg/dL — ABNORMAL HIGH (ref 0.44–1.00)
GFR, Estimated: 48 mL/min — ABNORMAL LOW (ref >60)
Glucose, Bld: 138 mg/dL — ABNORMAL HIGH (ref 70–99)
Potassium: 4 mmol/L (ref 3.5–5.1)
Sodium: 139 mmol/L (ref 135–145)

## 2024-10-30 LAB — GLUCOSE, CAPILLARY: Glucose-Capillary: 135 mg/dL — ABNORMAL HIGH (ref 70–99)

## 2024-10-30 MED ORDER — TORSEMIDE 10 MG PO TABS: 10.0000 mg | ORAL_TABLET | Freq: Every day | ORAL | Status: AC | PRN

## 2024-10-30 MED ORDER — LIDOCAINE 5 % EX PTCH: 1.0000 | MEDICATED_PATCH | CUTANEOUS | Status: AC

## 2024-10-30 MED ORDER — LOSARTAN POTASSIUM 50 MG PO TABS
50.0000 mg | ORAL_TABLET | Freq: Every day | ORAL | Status: DC
  Filled 2024-10-30: qty 1

## 2024-10-30 MED ORDER — AMLODIPINE BESYLATE 10 MG PO TABS
10.0000 mg | ORAL_TABLET | Freq: Every day | ORAL | Status: DC
  Filled 2024-10-30: qty 1

## 2024-10-30 MED ORDER — TRAZODONE HCL 50 MG PO TABS: 50.0000 mg | ORAL_TABLET | Freq: Every day | ORAL | Status: AC

## 2024-10-30 MED ORDER — POTASSIUM CHLORIDE ER 10 MEQ PO TBCR: 10.0000 meq | EXTENDED_RELEASE_TABLET | Freq: Every day | ORAL | Status: AC | PRN

## 2024-10-30 MED ORDER — METHOCARBAMOL 500 MG PO TABS: 500.0000 mg | ORAL_TABLET | Freq: Three times a day (TID) | ORAL | Status: AC | PRN

## 2024-10-30 MED ORDER — OXYCODONE-ACETAMINOPHEN 5-325 MG PO TABS: 1.0000 | ORAL_TABLET | ORAL | 0 refills | Status: AC | PRN

## 2024-10-30 MED ORDER — HYDRALAZINE HCL 25 MG PO TABS
25.0000 mg | ORAL_TABLET | Freq: Once | ORAL | Status: AC
  Administered 2024-10-30: 25 mg via ORAL
  Filled 2024-10-30: qty 1

## 2024-10-30 NOTE — Progress Notes (Signed)
 Occupational Therapy Treatment Patient Details Name: Kelsey Haynes MRN: [id number] DOB: 1932-04-25 Today's Date: 10/30/2024   History of present illness Pt is a 89 y/o F admitted on 10/28/24 after presenting with c/o a fall & L rib pain. Pt found to have L posterior 11th & 12th rib fxs. PMH: dementia, DM, HTN, asthma, GERD, CKD3A, obesity, R breast CA s/p radiation   OT comments  Pt is seated in recliner on arrival. Pleasant and agreeable to OT/PT co-treatment session. She continues to have LLE ankle pain with movement, noted to be leaning to R side in recliner. Pt performed seated grooming tasks in recliner requiring Min A for set up and cueing to perform. Able to stand x2 trials from recliner to RW with Mod A X2, cues for hand/feet placement and wider BOS and unable to correct forward flexed posture without assist and cues. Unable to safely progress mobility this date. Pt remains confused, answers some questions, but also leaves eyes closed a lot throughout session. Pt returned to recliner with all needs in place and will cont to require skilled acute OT services to maximize her safety and IND to return to PLOF.       If plan is discharge home, recommend the following:  A lot of help with walking and/or transfers;A lot of help with bathing/dressing/bathroom;Supervision due to cognitive status   Equipment Recommendations  Other (comment) (defer to next venue)    Recommendations for Other Services      Precautions / Restrictions Precautions Precautions: Fall Recall of Precautions/Restrictions: Impaired Precaution/Restrictions Comments: L rib fxs Restrictions Weight Bearing Restrictions Per Provider Order: No       Mobility Bed Mobility               General bed mobility comments: NT up in recliner pre/post session    Transfers Overall transfer level: Needs assistance Equipment used: Rolling walker (2 wheels) Transfers: Sit to/from Stand Sit to Stand: Mod assist, +2  physical assistance, +2 safety/equipment           General transfer comment: 2 trials of STS from recliner to RW with cues and assist for hand/feet placement, cues for wider BOS, noted with forward flexed posture and unable to correct without assist     Balance Overall balance assessment: Needs assistance Sitting-balance support: Feet supported Sitting balance-Leahy Scale: Fair     Standing balance support: During functional activity, Bilateral upper extremity supported, Reliant on assistive device for balance Standing balance-Leahy Scale: Poor Standing balance comment: RW and Mod A X1-2 to maintain static sitting balance                           ADL either performed or assessed with clinical judgement   ADL Overall ADL's : Needs assistance/impaired     Grooming: Minimal assistance;Sitting;Cueing for sequencing Grooming Details (indicate cue type and reason): seated in recliner                                    Extremity/Trunk Assessment              Vision       Perception     Praxis     Communication Communication Communication: Impaired Factors Affecting Communication: Hearing impaired   Cognition Arousal: Alert Behavior During Therapy: WFL for tasks assessed/performed, Flat affect Cognition: No family/caregiver present to determine baseline, Cognition impaired, History of  cognitive impairments   Orientation impairments: Time, Situation Awareness: Online awareness impaired Memory impairment (select all impairments): Declarative long-term memory, Short-term memory Attention impairment (select first level of impairment): Sustained attention Executive functioning impairment (select all impairments): Sequencing, Reasoning, Problem solving                   Following commands: Impaired Following commands impaired: Only follows one step commands consistently, Follows one step commands with increased time      Cueing    Cueing Techniques: Verbal cues, Tactile cues, Visual cues  Exercises      Shoulder Instructions       General Comments      Pertinent Vitals/ Pain       Pain Assessment Pain Assessment: Faces Faces Pain Scale: Hurts even more Pain Location: LLE Pain Descriptors / Indicators: Discomfort, Grimacing, Guarding Pain Intervention(s): Monitored during session, Repositioned  Home Living                                          Prior Functioning/Environment              Frequency  Min 2X/week        Progress Toward Goals  OT Goals(current goals can now be found in the care plan section)  Progress towards OT goals: Progressing toward goals  Acute Rehab OT Goals Patient Stated Goal: get stronger OT Goal Formulation: With patient Time For Goal Achievement: 11/12/24 Potential to Achieve Goals: Good  Plan      Co-evaluation                 AM-PAC OT 6 Clicks Daily Activity     Outcome Measure   Help from another person eating meals?: A Little Help from another person taking care of personal grooming?: A Little Help from another person toileting, which includes using toliet, bedpan, or urinal?: A Lot Help from another person bathing (including washing, rinsing, drying)?: A Lot Help from another person to put on and taking off regular upper body clothing?: A Little Help from another person to put on and taking off regular lower body clothing?: A Lot 6 Click Score: 15    End of Session Equipment Utilized During Treatment: Rolling walker (2 wheels)  OT Visit Diagnosis: Other abnormalities of gait and mobility (R26.89);Muscle weakness (generalized) (M62.81);Other symptoms and signs involving cognitive function   Activity Tolerance Patient tolerated treatment well   Patient Left in chair;with call bell/phone within reach;with chair alarm set   Nurse Communication Mobility status        Time: 1015-1040 OT Time Calculation (min): 25  min  Charges: OT General Charges $OT Visit: 1 Visit OT Treatments $Self Care/Home Management : 8-22 mins  Neftaly Inzunza, OTR/L  10/30/24, 12:59 PM   Eyana Stolze E Naw Lasala 10/30/2024, 12:56 PM

## 2024-10-30 NOTE — Plan of Care (Signed)
   Problem: Education: Goal: Knowledge of General Education information will improve Description Including pain rating scale, medication(s)/side effects and non-pharmacologic comfort measures Outcome: Progressing   Problem: Health Behavior/Discharge Planning: Goal: Ability to manage health-related needs will improve Outcome: Progressing

## 2024-10-30 NOTE — Assessment & Plan Note (Signed)
 pt has minimally displaced left posterior eleventh and twelfth rib fractures as shown by CT scan.  Pain currently seems bearable. -Continue with supportive care and pain management -Continue with incentive spirometry

## 2024-10-30 NOTE — Progress Notes (Signed)
 Physical Therapy Treatment Patient Details Name: Kelsey Haynes MRN: [id number] DOB: 1932-03-06 Today's Date: 10/30/2024   History of Present Illness Pt is a 89 y/o F admitted on 10/28/24 after presenting with c/o a fall & L rib pain. Pt found to have L posterior 11th & 12th rib fxs. PMH: dementia, DM, HTN, asthma, GERD, CKD3A, obesity, R breast CA s/p radiation    PT Comments  Pt received up in chair, OT already in focusing on ADL's. Co-tx overlap to safely progress functional mobility. Pt unable to tolerate B LE ROM/strengthening exercises due to pain/discomfort. Sit to stand with ModA of 2 at RW. Manual assist to wt shift forward and facilitate upright posture. Poor static standing tolerance due to fatigue and L lower leg discomfort during wt bearing and L posterior rib pain. Pt positioned to comfort in recliner with all needs in reach.    If plan is discharge home, recommend the following: Two people to help with walking and/or transfers;Two people to help with bathing/dressing/bathroom   Can travel by private vehicle     No  Equipment Recommendations  None recommended by PT    Recommendations for Other Services       Precautions / Restrictions Precautions Precautions: Fall Recall of Precautions/Restrictions: Impaired Precaution/Restrictions Comments: L rib fxs Restrictions Weight Bearing Restrictions Per Provider Order: No     Mobility  Bed Mobility               General bed mobility comments: NT up in recliner pre/post session    Transfers Overall transfer level: Needs assistance Equipment used: Rolling walker (2 wheels) Transfers: Sit to/from Stand Sit to Stand: Mod assist, +2 physical assistance, +2 safety/equipment           General transfer comment: 2 trials of STS from recliner to RW with cues and assist for hand/feet placement, cues for wider BOS, noted with forward flexed posture and unable to correct without assist    Ambulation/Gait                General Gait Details:  (NT)   Stairs             Wheelchair Mobility     Tilt Bed    Modified Rankin (Stroke Patients Only)       Balance Overall balance assessment: Needs assistance Sitting-balance support: Feet supported Sitting balance-Leahy Scale: Fair     Standing balance support: During functional activity, Bilateral upper extremity supported, Reliant on assistive device for balance Standing balance-Leahy Scale: Poor Standing balance comment: RW and Mod A X1-2 to maintain static sitting balance                            Communication Communication Communication: Impaired Factors Affecting Communication: Hearing impaired  Cognition Arousal: Alert Behavior During Therapy: WFL for tasks assessed/performed, Flat affect   PT - Cognitive impairments: History of cognitive impairments                         Following commands: Impaired Following commands impaired: Only follows one step commands consistently, Follows one step commands with increased time    Cueing Cueing Techniques: Verbal cues, Tactile cues, Visual cues  Exercises Other Exercises Other Exercises: edu re role of PT, role of rehab, discharge recommendations Other Exercises: Attempted B LE ROM, pt unable to tolerate due to pain    General Comments  Pertinent Vitals/Pain Pain Assessment Pain Assessment: Faces Faces Pain Scale: Hurts even more Pain Location: LLE Pain Descriptors / Indicators: Discomfort, Grimacing, Guarding Pain Intervention(s): Monitored during session    Home Living                          Prior Function            PT Goals (current goals can now be found in the care plan section) Acute Rehab PT Goals Patient Stated Goal: decreased pain    Frequency    Min 2X/week      PT Plan      Co-evaluation   Reason for Co-Treatment: For patient/therapist safety PT goals addressed during session: Mobility/safety  with mobility;Balance;Proper use of DME OT goals addressed during session: ADL's and self-care      AM-PAC PT 6 Clicks Mobility   Outcome Measure  Help needed turning from your back to your side while in a flat bed without using bedrails?: A Lot Help needed moving from lying on your back to sitting on the side of a flat bed without using bedrails?: Total Help needed moving to and from a bed to a chair (including a wheelchair)?: Total Help needed standing up from a chair using your arms (e.g., wheelchair or bedside chair)?: Total Help needed to walk in hospital room?: Total Help needed climbing 3-5 steps with a railing? : Total 6 Click Score: 7    End of Session Equipment Utilized During Treatment: Gait belt Activity Tolerance: Patient limited by pain Patient left: in chair;with chair alarm set;with call bell/phone within reach Nurse Communication: Mobility status PT Visit Diagnosis: Pain;Muscle weakness (generalized) (M62.81);Difficulty in walking, not elsewhere classified (R26.2);Unsteadiness on feet (R26.81) Pain - Right/Left: Left Pain - part of body: Leg     Time: 1023-1040 PT Time Calculation (min) (ACUTE ONLY): 17 min  Charges:    $Therapeutic Activity: 8-22 mins PT General Charges $$ ACUTE PT VISIT: 1 Visit                    Darice Bohr, PTA  Darice JAYSON Bohr 10/30/2024, 2:29 PM

## 2024-10-30 NOTE — Assessment & Plan Note (Signed)
 AKI improved with IV fluid, current creatinine of 1.1 -Monitor renal function -avoid nephrotoxins

## 2024-10-30 NOTE — TOC Transition Note (Signed)
 Transition of Care [facility]) - Discharge Note   Patient Details  Name: Kelsey Haynes MRN: [id number] Date of Birth: [birth year]  [facility]) CM/SW Contact:  Alvaro Louder, LCSW Phone Number: 10/30/2024, 1:03 PM   Clinical Narrative:   [facility] received insurance approval for patient to admit to SNF. LCSWA confirmed with MD that patient is stable for discharge. LCSWA notified the patient and they are in agreement with discharge. [facility]CSWA confirmed bed is available at SNF. Transport arranged with [facility] for next available.  Number for report [phone]   TOC signing off  Final next level of care: Skilled Nursing Facility Barriers to Discharge: No Barriers Identified   Patient Goals and CMS Choice            Discharge Placement              Patient chooses bed at: [facility] Patient to be transferred to facility by: [facility] Name of family member notified: [place] Patient and family notified of of transfer: 10/30/24  Discharge Plan and Services Additional resources added to the After Visit Summary for                                       Social Drivers of Health (SDOH) Interventions SDOH Screenings   Food Insecurity: No Food Insecurity (10/28/2024)  Housing: Low Risk  (10/28/2024)  Transportation Needs: No Transportation Needs (10/28/2024)  Utilities: Not At Risk (10/28/2024)  Financial Resource Strain: Low Risk  (10/24/2024)   Received from [facility]  Recent Concern: Financial Resource Strain - Medium Risk (07/27/2024)   Received from [facility]  Social Connections: Patient Declined (10/28/2024)  Tobacco Use: Medium Risk (10/28/2024)     Readmission Risk Interventions     No data to display

## 2024-10-30 NOTE — Discharge Summary (Addendum)
 Physician Discharge Summary   Patient: Kelsey Haynes MRN: [id number] DOB: 1932/01/04  Admit date:     10/28/2024  Discharge date: 10/30/24  Discharge Physician: Amaryllis Dare   PCP: Lenon Layman ORN, MD   Recommendations at discharge:  Please obtain CBC and BMP on follow-up Please avoid opioid pain medications if possible, try using simple Tylenol  first and only use pain medications if Tylenol  does not control her pain. Please avoid constipation by using regular bowel regimen if taking opioids. Please encourage p.o. hydration, patient need full assistance with feeding. Follow-up with primary care provider for further assistance  Discharge Diagnoses: Principal Problem:   Left rib fracture Active Problems:   Fall at home, initial encounter   Hypertension   Acute renal failure superimposed on stage 3a chronic kidney disease (HCC)   Asthma   Type II diabetes mellitus with renal manifestations (HCC)   Dementia without behavioral disturbance (HCC)   Obesity (BMI 30-39.9)   Left hip pain   Hospital Course:  Kelsey Haynes is a 89 y.o. female with medical history significant of dementia, diet-controlled DM, HTN, asthma, GERD, CKD 3A, obesity, right breast cancer 2006 (s/p radiation therapy), who presents with mechanical fall and left rib cage pain.   She was found to have left posterior 11th and 12th rib fracture.  On presentation hemodynamically stable, labs with worsening renal function, negative UA.  CT head was negative for any acute abnormality.  CT of C-spine negative for acute injury, did show degenerative disc disease.  Left hip and pelvic imaging was negative for any bony fractures. CT abdomen and pelvis shows a left posterior 11th and 12th rib fracture.  Patient was admitted for pain control.  PT and OT ordered.  11/2: Vital stable, improvement of creatinine to 1.23, giving some more IV fluid. Pt is recommending SNF. Left ankle imaging was obtained as she was  complaining of pain and it was negative for any bony injuries.  11/3: Remained hemodynamically stable, blood pressure mildly elevated.  Creatinine with further improvement to 1.1.  Home losartan  was restarted.  Patient was given opioid pain medication and muscle relaxant to be used as needed.  Skilled nursing facility staff should avoid oversedation.  While taking opioids she should be getting a regular bowel regimen to avoid constipation.  Please be vigilant with pain medications and try controlling pain with nonopioid before giving her opioids.    She had multiple prescriptions of Neurontin , we continue Neurontin  300 mg 3 times a day.  Decrease the dose of trazodone  to decrease the risk of extra sedation.  We made torsemide as needed only if she develops excessive edema to prevent worsening of creatinine.  Potassium supplement should be only given when she used torsemide.  Patient is being discharged to rehab and from where she can go back to her memory care unit.  She will continue on current medications and need to have a close follow-up with her providers for further assistance.  Assessment and Plan: * Left rib fracture pt has minimally displaced left posterior eleventh and twelfth rib fractures as shown by CT scan.  Pain currently seems bearable. -Continue with supportive care and pain management -Continue with incentive spirometry  Fall at home, initial encounter Likely resulted in rib fractures.  No other bony injuries. - PT is recommending SNF  Hypertension Blood pressure currently elevated. -Continue home amlodipine  and losartan . We initially held losartan  for concern of AKI and she will resume now  Acute renal failure superimposed on stage 3a  chronic kidney disease (HCC) AKI improved with IV fluid, current creatinine of 1.1 -Monitor renal function -avoid nephrotoxins  Asthma No concern of exacerbation. - Continue with as needed bronchodilator  Type II diabetes  mellitus with renal manifestations (HCC) Seems well-controlled, recent A1c of 6.5.  Patient was not taking any medication at home. - Continue to monitor  Dementia without behavioral disturbance (HCC) - Continue with donepezil  Obesity (BMI 30-39.9) Patient has class I obesity with BMI of 32.6.   Pain control - [place]  Controlled Substance Reporting System database was reviewed. and patient was instructed, not to drive, operate heavy machinery, perform activities at heights, swimming or participation in water activities or provide baby-sitting services while on Pain, Sleep and Anxiety Medications; until their outpatient Physician has advised to do so again. Also recommended to not to take more than prescribed Pain, Sleep and Anxiety Medications.  Consultants: None Procedures performed: None Disposition: Skilled nursing facility Diet recommendation:  Discharge Diet Orders (From admission, onward)     Start     Ordered   10/30/24 0000  Diet - low sodium heart healthy        10/30/24 1147           Cardiac and Carb modified diet DISCHARGE MEDICATION: Allergies as of 10/30/2024       Reactions   Other Swelling, Other (See Comments)   Hazel nuts        Medication List     STOP taking these medications    amoxicillin 875 MG tablet Commonly known as: AMOXIL       TAKE these medications    acetaminophen  325 MG tablet Commonly known as: TYLENOL  Take 650 mg by mouth every 6 (six) hours as needed for moderate pain or headache.   amLODipine  10 MG tablet Commonly known as: NORVASC  Take 1 tablet (10 mg total) by mouth daily.   aspirin  81 MG chewable tablet Chew 81 mg by mouth daily.   cyanocobalamin 500 MCG tablet Commonly known as: VITAMIN B12 Take 500 mcg by mouth daily.   donepezil 5 MG tablet Commonly known as: ARICEPT Take 5 mg by mouth at bedtime.   donepezil 10 MG tablet Commonly known as: ARICEPT Take 10 mg by mouth at bedtime.   gabapentin  300  MG capsule Commonly known as: NEURONTIN  Take 300 mg by mouth 3 (three) times daily. What changed: Another medication with the same name was removed. Continue taking this medication, and follow the directions you see here.   ibuprofen  200 MG tablet Commonly known as: ADVIL  Take 200 mg by mouth every 6 (six) hours as needed.   lidocaine  5 % Commonly known as: LIDODERM  Place 1 patch onto the skin daily. Remove & Discard patch within 12 hours or as directed by MD   losartan  50 MG tablet Commonly known as: Cozaar  Take 1 tablet (50 mg total) by mouth daily.   melatonin 3 MG Tabs tablet Take 5 mg by mouth at bedtime.   methocarbamol 500 MG tablet Commonly known as: ROBAXIN Take 1 tablet (500 mg total) by mouth every 8 (eight) hours as needed for muscle spasms.   Multi-Vitamins Tabs Take 1 tablet by mouth daily.   omeprazole 40 MG capsule Commonly known as: PRILOSEC Take 40 mg by mouth 2 (two) times daily.   omeprazole 20 MG capsule Commonly known as: PRILOSEC Take 20 mg by mouth every morning.   omeprazole 20 MG tablet Commonly known as: PriLOSEC OTC Take 1 tablet (20 mg total) by  mouth daily.   oxyCODONE -acetaminophen  5-325 MG tablet Commonly known as: PERCOCET/ROXICET Take 1 tablet by mouth every 4 (four) hours as needed for moderate pain (pain score 4-6).   potassium chloride 10 MEQ tablet Commonly known as: KLOR-CON Take 1 tablet (10 mEq total) by mouth daily as needed (When patient takes torsemide). What changed:  when to take this reasons to take this   tolterodine 2 MG tablet Commonly known as: DETROL Take 2 mg by mouth 2 (two) times daily.   torsemide 10 MG tablet Commonly known as: DEMADEX Take 1 tablet (10 mg total) by mouth daily as needed (For fluid collection). What changed:  when to take this reasons to take this   traZODone  50 MG tablet Commonly known as: DESYREL  Take 1 tablet (50 mg total) by mouth at bedtime. What changed:  medication  strength how much to take   Voltaren Arthritis Pain 1 % Gel Generic drug: diclofenac Sodium Apply topically 4 (four) times daily.        Contact information for follow-up providers     Lenon Layman ORN, MD. Schedule an appointment as soon as possible for a visit in 1 week(s).   Specialty: Internal Medicine Contact information: [address] [facility] [place] [address] [phone]              Contact information for after-discharge care     Destination     [facility] .   Service: Skilled Nursing Contact information: [address] [place]  [address] [phone]                    Discharge Exam: Kelsey Haynes   10/28/24 1358  Weight: 70.8 kg   General.  Obese and frail elderly lady, in no acute distress. Pulmonary.  Lungs clear bilaterally, normal respiratory effort. CV.  Regular rate and rhythm, no JVD, rub or murmur. Abdomen.  Soft, nontender, nondistended, BS positive. CNS.  Alert and oriented to self.  No focal neurologic deficit. Extremities.  No edema, no cyanosis, pulses intact and symmetrical.  Condition at discharge: stable  The results of significant diagnostics from this hospitalization (including imaging, microbiology, ancillary and laboratory) are listed below for reference.   Imaging Studies: DG Ankle Complete Left Result Date: 10/29/2024 CLINICAL DATA:  Status post fall EXAM: LEFT ANKLE COMPLETE - 3 VIEW COMPARISON:  None Available. FINDINGS: There is no evidence of fracture, dislocation, or joint effusion. Degenerative changes of the ankle. Soft tissues are unremarkable. IMPRESSION: 1. No acute fracture or dislocation. 2. Degenerative changes of the ankle. Electronically Signed   By: Limin  Xu M.D.   On: 10/29/2024 11:37   CT ABDOMEN PELVIS WO CONTRAST Result Date: 10/28/2024 CLINICAL DATA:  Abdominal pain, fell EXAM: CT ABDOMEN AND PELVIS WITHOUT CONTRAST TECHNIQUE:  Multidetector CT imaging of the abdomen and pelvis was performed following the standard protocol without IV contrast. Unenhanced CT was performed per clinician order. Lack of IV contrast limits sensitivity and specificity, especially for evaluation of abdominal/pelvic solid viscera. RADIATION DOSE REDUCTION: This exam was performed according to the departmental dose-optimization program which includes automated exposure control, adjustment of the mA and/or kV according to patient size and/or use of iterative reconstruction technique. COMPARISON:  None Available. FINDINGS: Lower chest: No acute pleural or parenchymal lung disease. Hepatobiliary: Unremarkable unenhanced appearance of the liver and gallbladder. No biliary duct dilation. Pancreas: Unremarkable unenhanced appearance. Spleen: Unremarkable unenhanced appearance. Adrenals/Urinary Tract: The adrenals are unremarkable. No urinary tract  calculi or obstructive uropathy within either kidney. Bladder is moderately distended without filling defect. Stomach/Bowel: No bowel obstruction or ileus. Normal appendix right lower quadrant. No bowel wall thickening or inflammatory change. Vascular/Lymphatic: Aortic atherosclerosis. No enlarged abdominal or pelvic lymph nodes. Reproductive: Status post hysterectomy. No adnexal masses. Other: No free fluid or free intraperitoneal gas. No abdominal wall hernia. Musculoskeletal: There are minimally displaced acute left posterior eleventh and twelfth rib fractures. No other acute bony abnormalities. Postsurgical changes within the lower lumbar spine. Reconstructed images demonstrate no additional findings. IMPRESSION: 1. Minimally displaced left posterior eleventh and twelfth rib fractures. 2. Otherwise unremarkable unenhanced CT of the abdomen and pelvis. 3.  Aortic Atherosclerosis (ICD10-I70.0). Electronically Signed   By: Ozell Daring M.D.   On: 10/28/2024 19:34   CT Cervical Spine Wo Contrast Result Date:  10/28/2024 EXAM: CT CERVICAL SPINE WITHOUT CONTRAST 10/28/2024 07:08:16 PM TECHNIQUE: CT of the cervical spine was performed without the administration of intravenous contrast. Multiplanar reformatted images are provided for review. Automated exposure control, iterative reconstruction, and/or weight based adjustment of the mA/kV was utilized to reduce the radiation dose to as low as reasonably achievable. COMPARISON: None available. CLINICAL HISTORY: Neck trauma (Age >= 65y) FINDINGS: CERVICAL SPINE: BONES AND ALIGNMENT: No acute fracture. 3 mm degenerative listhesis of C3 on C4. DEGENERATIVE CHANGES: Advanced diffuse degenerative disc and facet disease. Multilevel bilateral neural foraminal narrowing. SOFT TISSUES: No prevertebral soft tissue swelling. IMPRESSION: 1. No acute abnormality of the cervical spine related to the reported neck trauma. 2. Advanced diffuse degenerative changes. Electronically signed by: Franky Crease MD 10/28/2024 07:30 PM EDT RP Workstation: [id number]   CT HEAD WO CONTRAST ( ) Result Date: 10/28/2024 EXAM: CT HEAD WITHOUT CONTRAST 10/28/2024 07:08:16 PM TECHNIQUE: CT of the head was performed without the administration of intravenous contrast. Automated exposure control, iterative reconstruction, and/or weight based adjustment of the mA/kV was utilized to reduce the radiation dose to as low as reasonably achievable. COMPARISON: 12/04/2018 CLINICAL HISTORY: Head trauma, intracranial arterial injury suspected. FINDINGS: BRAIN AND VENTRICLES: No acute hemorrhage. No evidence of acute infarct. No hydrocephalus. No extra-axial collection. No mass effect or midline shift. There is diffuse cerebral atrophy and chronic small vessel disease throughout the deep white matter. ORBITS: No acute abnormality. SINUSES: No acute abnormality. SOFT TISSUES AND SKULL: No acute soft tissue abnormality. No skull fracture. IMPRESSION: 1. No acute intracranial abnormality. 2. Diffuse cerebral atrophy and  chronic small vessel disease throughout the deep white matter. Electronically signed by: Franky Crease MD 10/28/2024 07:27 PM EDT RP Workstation: [id number]   DG Hip Unilat W or Wo Pelvis 2-3 Views Left Result Date: 10/28/2024 CLINICAL DATA:  Clemens onto left hip, pain EXAM: DG HIP (WITH OR WITHOUT PELVIS) 2-3V LEFT COMPARISON:  10/06/2020 FINDINGS: Frontal view of the pelvis as well as frontal and cross-table lateral views of the left hip are obtained. No acute fracture, subluxation, or dislocation. Mild symmetrical bilateral hip osteoarthritis. Visualized portions of the sacroiliac joints are unremarkable. Oral contrast within the colon obscures portions of the lower lumbar spine and iliac crests. Stable postsurgical changes from lower lumbar fusion. IMPRESSION: 1. No acute displaced fracture. 2. Symmetrical bilateral hip osteoarthritis. Electronically Signed   By: Ozell Daring M.D.   On: 10/28/2024 14:46   DG ESOPHAGUS W SINGLE CM (SOL OR THIN BA) Result Date: 10/27/2024 CLINICAL DATA:  Patient with history of occasional dysphagia with solid foods. EXAM: ESOPHAGUS/BARIUM SWALLOW/TABLET STUDY TECHNIQUE: Single contrast examination was performed using thin liquid barium. This exam  was performed by Warren Dais, NP, and was supervised and interpreted by Dr. Hughes. FLUOROSCOPY: Radiation Exposure Index (as provided by the fluoroscopic device): 10.10 mGy Kerma COMPARISON:  DG UGI 01/26/2022.  CT chest, 12/03/2022. FINDINGS: Swallowing: Trace vestibular penetration observed. Pharynx: Unremarkable. Esophagus: Normal appearance. Esophageal motility: Moderate dysmotility with tertiary contractions Hiatal Hernia: None. Gastroesophageal reflux: None visualized. Ingested 13mm barium tablet: Not given. Patient takes her pills crushed. Other: None. IMPRESSION: Suboptimal evaluation secondary to the patient's functional status, within these constraints; Moderate dysmotility with tertiary contractions, as can be seen  with presbyesophagus. Electronically Signed   By: Thom Hughes M.D.   On: 10/27/2024 15:54    Microbiology: Results for orders placed or performed during the hospital encounter of 12/03/22  Resp Panel by RT-PCR (Flu A&B, Covid) Anterior Nasal Swab     Status: None   Collection Time: 12/03/22 10:36 PM   Specimen: Anterior Nasal Swab  Result Value Ref Range Status   SARS Coronavirus 2 by RT PCR NEGATIVE NEGATIVE Final    Comment: (NOTE) SARS-CoV-2 target nucleic acids are NOT DETECTED.  The SARS-CoV-2 RNA is generally detectable in upper respiratory specimens during the acute phase of infection. The lowest concentration of SARS-CoV-2 viral copies this assay can detect is 138 copies/mL. A negative result does not preclude SARS-Cov-2 infection and should not be used as the sole basis for treatment or other patient management decisions. A negative result may occur with  improper specimen collection/handling, submission of specimen other than nasopharyngeal swab, presence of viral mutation(s) within the areas targeted by this assay, and inadequate number of viral copies(<138 copies/mL). A negative result must be combined with clinical observations, patient history, and epidemiological information. The expected result is Negative.  Fact Sheet for Patients:  bloggercourse.com  Fact Sheet for Healthcare Providers:  seriousbroker.it  This test is no t yet approved or cleared by the [place]  FDA and  has been authorized for detection and/or diagnosis of SARS-CoV-2 by FDA under an Emergency Use Authorization (EUA). This EUA will remain  in effect (meaning this test can be used) for the duration of the COVID-19 declaration under Section 564(b)(1) of the Act, 21 U.S.C.section 360bbb-3(b)(1), unless the authorization is terminated  or revoked sooner.       Influenza A by PCR NEGATIVE NEGATIVE Final   Influenza B by PCR NEGATIVE  NEGATIVE Final    Comment: (NOTE) The Xpert Xpress SARS-CoV-2/FLU/RSV plus assay is intended as an aid in the diagnosis of influenza from Nasopharyngeal swab specimens and should not be used as a sole basis for treatment. Nasal washings and aspirates are unacceptable for Xpert Xpress SARS-CoV-2/FLU/RSV testing.  Fact Sheet for Patients: bloggercourse.com  Fact Sheet for Healthcare Providers: seriousbroker.it  This test is not yet approved or cleared by the [place]  FDA and has been authorized for detection and/or diagnosis of SARS-CoV-2 by FDA under an Emergency Use Authorization (EUA). This EUA will remain in effect (meaning this test can be used) for the duration of the COVID-19 declaration under Section 564(b)(1) of the Act, 21 U.S.C. section 360bbb-3(b)(1), unless the authorization is terminated or revoked.  Performed at [facility], [address] [place], [place] [address]     Labs: CBC: Recent Labs  Lab 10/28/24 1720 10/29/24 0430  WBC 5.0 5.2  NEUTROABS 2.2  --   HGB 12.0 11.3*  HCT 36.6 35.8*  MCV 90.8 93.5  PLT 132* 136*   Basic Metabolic Panel: Recent Labs  Lab 10/28/24 1720 10/29/24 0430  10/30/24 0342  NA 140 141 139  K 4.7 4.4 4.0  CL 100 104 102  CO2 30 28 27   GLUCOSE 113* 95 138*  BUN 44* 37* 34*  CREATININE 1.61* 1.23* 1.10*  CALCIUM 8.6* 8.4* 8.5*  MG 2.4  --   --    Liver Function Tests: Recent Labs  Lab 10/28/24 1720  AST 17  ALT 16  ALKPHOS 51  BILITOT 0.7  PROT 6.3*  ALBUMIN 3.8   CBG: Recent Labs  Lab 10/29/24 0749 10/30/24 0747  GLUCAP 104* 135*    Discharge time spent: greater than 30 minutes.  This record has been created using Conservation officer, historic buildings. Errors have been sought and corrected,but may not always be located. Such creation errors do not reflect on the standard of care.   Signed: Amaryllis Dare, MD [facility] 10/30/2024

## 2024-10-30 NOTE — [place] FL2 (Signed)
 [place]  MEDICAID FL2 LEVEL OF CARE FORM     IDENTIFICATION  Patient Name: Kelsey Haynes Birthdate: 04/17/1932 Sex: female Admission Date (Current Location): 10/28/2024  [place] and [place] Number:  Chiropodist and Address:  [facility], [address], [place], [place] [address]      Provider Number: [id number]  Attending Physician Name and Address:  Caleen Qualia, MD  Relative Name and Phone Number:       Current Level of Care: SNF Recommended Level of Care: Skilled Nursing Facility Prior Approval Number:    Date Approved/Denied:   PASRR Number: [id number]  Discharge Plan: Home    Current Diagnoses: Patient Active Problem List   Diagnosis Date Noted   Left hip pain 10/29/2024   Left rib fracture 10/28/2024   Fall at home, initial encounter 10/28/2024   Type II diabetes mellitus with renal manifestations (HCC) 10/28/2024   Acute renal failure superimposed on stage 3a chronic kidney disease (HCC) 10/28/2024   Obesity (BMI 30-39.9) 10/28/2024   Dementia without behavioral disturbance (HCC) 10/28/2024   Hypertension    Asthma    Multiple rib fractures 10/06/2020    Orientation RESPIRATION BLADDER Height & Weight     Self  Normal Incontinent Weight: 156 lb (70.8 kg) Height:  4' 10 (147.3 cm)  BEHAVIORAL SYMPTOMS/MOOD NEUROLOGICAL BOWEL NUTRITION STATUS      Incontinent Diet (Heart Healthy)  AMBULATORY STATUS COMMUNICATION OF NEEDS Skin   Extensive Assist Verbally Normal                       Personal Care Assistance Level of Assistance  Bathing, Feeding, Dressing Bathing Assistance: Limited assistance Feeding assistance: Independent Dressing Assistance: Limited assistance     Functional Limitations Info  Sight, Hearing, Speech Sight Info: Adequate Hearing Info: Impaired Speech Info: Adequate    SPECIAL CARE FACTORS FREQUENCY  PT (By licensed PT), OT (By licensed OT)     PT Frequency: 5x/week OT Frequency:  5x/week            Contractures      Additional Factors Info  Code Status, Allergies Code Status Info: Full Allergies Info: Other           Current Medications (10/30/2024):  This is the current hospital active medication list Current Facility-Administered Medications  Medication Dose Route Frequency Provider Last Rate Last Admin   acetaminophen  (TYLENOL ) tablet 650 mg  650 mg Oral Q6H PRN Niu, Xilin, MD   650 mg at 10/30/24 0959   albuterol (PROVENTIL) (2.5 MG/3ML) 0.083% nebulizer solution 2.5 mg  2.5 mg Nebulization Q4H PRN Niu, Xilin, MD       amLODipine  (NORVASC ) tablet 10 mg  10 mg Oral Daily Amin, Sumayya, MD       aspirin  chewable tablet 81 mg  81 mg Oral Daily Niu, Xilin, MD   81 mg at 10/29/24 1007   cyanocobalamin (VITAMIN B12) tablet 500 mcg  500 mcg Oral Daily Niu, Xilin, MD   500 mcg at 10/29/24 1005   dextromethorphan-guaiFENesin  (MUCINEX  DM) 30-600 MG per 12 hr tablet 1 tablet  1 tablet Oral BID PRN Niu, Xilin, MD       donepezil (ARICEPT) tablet 5 mg  5 mg Oral QHS Niu, Xilin, MD   5 mg at 10/29/24 2134   enoxaparin  (LOVENOX ) injection 30 mg  30 mg Subcutaneous Q24H Niu, Xilin, MD   30 mg at 10/29/24 2135   gabapentin  (NEURONTIN ) capsule 400 mg  400  mg Oral BID Niu, Xilin, MD   400 mg at 10/29/24 2134   hydrALAZINE (APRESOLINE) injection 5 mg  5 mg Intravenous Q2H PRN Niu, Xilin, MD       lidocaine  (LIDODERM ) 5 % 1 patch  1 patch Transdermal Q24H Niu, Xilin, MD   1 patch at 10/29/24 1957   losartan  (COZAAR ) tablet 50 mg  50 mg Oral Daily Amin, Sumayya, MD       melatonin tablet 5 mg  5 mg Oral QHS PRN Niu, Xilin, MD   5 mg at 10/29/24 2134   methocarbamol (ROBAXIN) tablet 500 mg  500 mg Oral Q8H PRN Niu, Xilin, MD       multivitamin with minerals tablet 1 tablet  1 tablet Oral Daily Niu, Xilin, MD   1 tablet at 10/29/24 1007   ondansetron  (ZOFRAN ) injection 4 mg  4 mg Intravenous Q8H PRN Niu, Xilin, MD       oxyCODONE -acetaminophen  (PERCOCET/ROXICET) 5-325 MG per  tablet 1 tablet  1 tablet Oral Q4H PRN Niu, Xilin, MD   1 tablet at 10/30/24 0449   pantoprazole  (PROTONIX ) EC tablet 40 mg  40 mg Oral Daily Niu, Xilin, MD   40 mg at 10/29/24 1012     Discharge Medications: Please see discharge summary for a list of discharge medications.  Relevant Imaging Results:  Relevant Lab Results:   Additional Information SSN: [id number]  Alvaro Louder, LCSW

## 2024-10-30 NOTE — Plan of Care (Signed)
  Problem: Education: Goal: Knowledge of General Education information will improve Description: Including pain rating scale, medication(s)/side effects and non-pharmacologic comfort measures Outcome: Progressing   Problem: Health Behavior/Discharge Planning: Goal: Ability to manage health-related needs will improve Outcome: Progressing   Problem: Activity: Goal: Risk for activity intolerance will decrease Outcome: Progressing   Problem: Nutrition: Goal: Adequate nutrition will be maintained Outcome: Progressing   Problem: Pain Managment: Goal: General experience of comfort will improve and/or be controlled Outcome: Progressing   Problem: Safety: Goal: Ability to remain free from injury will improve Outcome: Progressing   Problem: Skin Integrity: Goal: Risk for impaired skin integrity will decrease Outcome: Progressing

## 2024-10-30 NOTE — Assessment & Plan Note (Signed)
 Blood pressure currently elevated. -Continue home amlodipine  and losartan . We initially held losartan  for concern of AKI and she will resume now

## 2024-10-31 NOTE — Progress Notes (Signed)
 [facility] here to transport patient back to her facility. Patient calm, comfortable and agreeable to transport. Patients daughter Mliss notified via phone call.
